# Patient Record
Sex: Male | Born: 1960
Health system: Southern US, Community
[De-identification: ages and names within clinical notes are randomized; demographics above are authoritative.]

## PROBLEM LIST (undated history)

## (undated) DIAGNOSIS — I2510 Atherosclerotic heart disease of native coronary artery without angina pectoris: Secondary | ICD-10-CM

## (undated) DIAGNOSIS — Q2543 Congenital aneurysm of aorta: Secondary | ICD-10-CM

## (undated) DIAGNOSIS — I10 Essential (primary) hypertension: Secondary | ICD-10-CM

## (undated) DIAGNOSIS — D751 Secondary polycythemia: Secondary | ICD-10-CM

## (undated) DIAGNOSIS — I444 Left anterior fascicular block: Secondary | ICD-10-CM

## (undated) DIAGNOSIS — E291 Testicular hypofunction: Secondary | ICD-10-CM

## (undated) DIAGNOSIS — Z531 Procedure and treatment not carried out because of patient's decision for reasons of belief and group pressure: Secondary | ICD-10-CM

## (undated) DIAGNOSIS — IMO0001 Reserved for inherently not codable concepts without codable children: Secondary | ICD-10-CM

## (undated) DIAGNOSIS — E785 Hyperlipidemia, unspecified: Secondary | ICD-10-CM

## (undated) DIAGNOSIS — R2231 Localized swelling, mass and lump, right upper limb: Secondary | ICD-10-CM

## (undated) DIAGNOSIS — G4733 Obstructive sleep apnea (adult) (pediatric): Secondary | ICD-10-CM

## (undated) DIAGNOSIS — N182 Chronic kidney disease, stage 2 (mild): Secondary | ICD-10-CM

## (undated) DIAGNOSIS — I4510 Unspecified right bundle-branch block: Secondary | ICD-10-CM

## (undated) DIAGNOSIS — K219 Gastro-esophageal reflux disease without esophagitis: Secondary | ICD-10-CM

## (undated) DIAGNOSIS — N529 Male erectile dysfunction, unspecified: Secondary | ICD-10-CM

## (undated) DIAGNOSIS — IMO0002 Reserved for concepts with insufficient information to code with codable children: Secondary | ICD-10-CM

## (undated) DIAGNOSIS — R011 Cardiac murmur, unspecified: Secondary | ICD-10-CM

## (undated) DIAGNOSIS — I5030 Unspecified diastolic (congestive) heart failure: Secondary | ICD-10-CM

## (undated) DIAGNOSIS — M7989 Other specified soft tissue disorders: Secondary | ICD-10-CM

## (undated) DIAGNOSIS — E1165 Type 2 diabetes mellitus with hyperglycemia: Secondary | ICD-10-CM

## (undated) HISTORY — DX: Hyperlipidemia, unspecified: E78.5

## (undated) HISTORY — PX: OTHER SURGICAL HISTORY: SHX169

## (undated) HISTORY — PX: TRANSTHORACIC ECHOCARDIOGRAM: SHX275

## (undated) HISTORY — DX: Essential (primary) hypertension: I10

## (undated) HISTORY — DX: Gastro-esophageal reflux disease without esophagitis: K21.9

## (undated) HISTORY — DX: Male erectile dysfunction, unspecified: N52.9

---

## 1998-05-07 ENCOUNTER — Emergency Department (HOSPITAL_COMMUNITY): Admission: EM | Admit: 1998-05-07 | Discharge: 1998-05-07 | Payer: Self-pay | Admitting: Emergency Medicine

## 2002-03-01 ENCOUNTER — Encounter: Admission: RE | Admit: 2002-03-01 | Discharge: 2002-03-01 | Payer: Self-pay | Admitting: Urology

## 2002-03-01 ENCOUNTER — Encounter: Payer: Self-pay | Admitting: Urology

## 2005-03-11 ENCOUNTER — Ambulatory Visit: Payer: Self-pay | Admitting: Hematology & Oncology

## 2005-04-28 ENCOUNTER — Ambulatory Visit: Payer: Self-pay | Admitting: Hematology & Oncology

## 2005-06-25 ENCOUNTER — Ambulatory Visit: Payer: Self-pay | Admitting: Hematology & Oncology

## 2005-09-17 ENCOUNTER — Ambulatory Visit: Payer: Self-pay | Admitting: Hematology & Oncology

## 2005-09-30 ENCOUNTER — Ambulatory Visit (HOSPITAL_COMMUNITY): Admission: RE | Admit: 2005-09-30 | Discharge: 2005-09-30 | Payer: Self-pay | Admitting: Hematology & Oncology

## 2005-12-10 ENCOUNTER — Ambulatory Visit: Payer: Self-pay | Admitting: Hematology & Oncology

## 2006-01-21 ENCOUNTER — Ambulatory Visit: Payer: Self-pay | Admitting: Hematology & Oncology

## 2006-01-21 LAB — CBC WITH DIFFERENTIAL/PLATELET
Basophils Absolute: 0 10*3/uL (ref 0.0–0.1)
EOS%: 4.3 % (ref 0.0–7.0)
HCT: 47.9 % (ref 38.7–49.9)
HGB: 16.3 g/dL (ref 13.0–17.1)
MCH: 31.2 pg (ref 28.0–33.4)
MCV: 91.6 fL (ref 81.6–98.0)
MONO%: 10.7 % (ref 0.0–13.0)
NEUT%: 56.6 % (ref 40.0–75.0)
RDW: 13.4 % (ref 11.2–14.6)

## 2006-03-01 ENCOUNTER — Ambulatory Visit: Payer: Self-pay | Admitting: Hematology & Oncology

## 2006-03-20 LAB — FERRITIN: Ferritin: 26 ng/mL (ref 22–322)

## 2006-03-20 LAB — CBC WITH DIFFERENTIAL/PLATELET
Eosinophils Absolute: 0.4 10*3/uL (ref 0.0–0.5)
LYMPH%: 24.5 % (ref 14.0–48.0)
MONO#: 0.9 10*3/uL (ref 0.1–0.9)
NEUT#: 5.6 10*3/uL (ref 1.5–6.5)
Platelets: 242 10*3/uL (ref 145–400)
RBC: 5.21 10*6/uL (ref 4.20–5.71)
WBC: 9.2 10*3/uL (ref 4.0–10.0)
lymph#: 2.3 10*3/uL (ref 0.9–3.3)

## 2006-04-13 ENCOUNTER — Ambulatory Visit: Payer: Self-pay | Admitting: Hematology & Oncology

## 2006-05-08 LAB — CBC WITH DIFFERENTIAL/PLATELET
Basophils Absolute: 0.1 10*3/uL (ref 0.0–0.1)
EOS%: 5.2 % (ref 0.0–7.0)
Eosinophils Absolute: 0.5 10*3/uL (ref 0.0–0.5)
HGB: 18.4 g/dL — ABNORMAL HIGH (ref 13.0–17.1)
MCH: 32.7 pg (ref 28.0–33.4)
NEUT#: 5.2 10*3/uL (ref 1.5–6.5)
RDW: 13.2 % (ref 11.2–14.6)
lymph#: 2.1 10*3/uL (ref 0.9–3.3)

## 2006-07-02 ENCOUNTER — Ambulatory Visit: Payer: Self-pay | Admitting: Hematology & Oncology

## 2006-07-15 LAB — CBC WITH DIFFERENTIAL/PLATELET
BASO%: 0.4 % (ref 0.0–2.0)
EOS%: 3 % (ref 0.0–7.0)
HCT: 46.1 % (ref 38.7–49.9)
LYMPH%: 21.6 % (ref 14.0–48.0)
MCH: 33.2 pg (ref 28.0–33.4)
MCHC: 35.6 g/dL (ref 32.0–35.9)
NEUT%: 65.9 % (ref 40.0–75.0)
Platelets: 276 10*3/uL (ref 145–400)

## 2006-07-22 LAB — CBC WITH DIFFERENTIAL/PLATELET
BASO%: 0.6 % (ref 0.0–2.0)
EOS%: 3.9 % (ref 0.0–7.0)
MCH: 33.3 pg (ref 28.0–33.4)
MCHC: 35.5 g/dL (ref 32.0–35.9)
RBC: 4.67 10*6/uL (ref 4.20–5.71)
RDW: 13.7 % (ref 11.2–14.6)
lymph#: 2 10*3/uL (ref 0.9–3.3)

## 2006-08-17 ENCOUNTER — Ambulatory Visit: Payer: Self-pay | Admitting: Hematology & Oncology

## 2006-10-07 ENCOUNTER — Ambulatory Visit: Payer: Self-pay | Admitting: Hematology & Oncology

## 2006-10-12 LAB — CBC WITH DIFFERENTIAL/PLATELET
BASO%: 0.4 % (ref 0.0–2.0)
LYMPH%: 28.4 % (ref 14.0–48.0)
MCH: 31.2 pg (ref 28.0–33.4)
MCHC: 34.8 g/dL (ref 32.0–35.9)
MCV: 89.6 fL (ref 81.6–98.0)
MONO%: 8.9 % (ref 0.0–13.0)
Platelets: 261 10*3/uL (ref 145–400)
RBC: 5.27 10*6/uL (ref 4.20–5.71)

## 2006-10-12 LAB — FERRITIN: Ferritin: 34 ng/mL (ref 22–322)

## 2006-11-25 ENCOUNTER — Ambulatory Visit: Payer: Self-pay | Admitting: Hematology & Oncology

## 2007-01-19 ENCOUNTER — Ambulatory Visit: Payer: Self-pay | Admitting: Hematology & Oncology

## 2007-01-29 LAB — CBC WITH DIFFERENTIAL/PLATELET
BASO%: 0.5 % (ref 0.0–2.0)
EOS%: 3.8 % (ref 0.0–7.0)
MCH: 32.4 pg (ref 28.0–33.4)
MCHC: 36.4 g/dL — ABNORMAL HIGH (ref 32.0–35.9)
MONO%: 10.7 % (ref 0.0–13.0)
NEUT%: 58.3 % (ref 40.0–75.0)
RDW: 14 % (ref 11.2–14.6)
lymph#: 2.1 10*3/uL (ref 0.9–3.3)

## 2007-04-27 ENCOUNTER — Ambulatory Visit: Payer: Self-pay | Admitting: Hematology & Oncology

## 2007-04-30 LAB — CBC WITH DIFFERENTIAL/PLATELET
Basophils Absolute: 0 10*3/uL (ref 0.0–0.1)
Eosinophils Absolute: 0.3 10*3/uL (ref 0.0–0.5)
HCT: 45.7 % (ref 38.7–49.9)
HGB: 16.6 g/dL (ref 13.0–17.1)
LYMPH%: 25.9 % (ref 14.0–48.0)
MONO#: 0.7 10*3/uL (ref 0.1–0.9)
NEUT#: 5.4 10*3/uL (ref 1.5–6.5)
Platelets: 254 10*3/uL (ref 145–400)
RBC: 4.95 10*6/uL (ref 4.20–5.71)
WBC: 8.8 10*3/uL (ref 4.0–10.0)

## 2007-04-30 LAB — FERRITIN: Ferritin: 119 ng/mL (ref 22–322)

## 2007-05-13 LAB — CBC WITH DIFFERENTIAL/PLATELET
Basophils Absolute: 0 10*3/uL (ref 0.0–0.1)
HCT: 44.6 % (ref 38.7–49.9)
HGB: 16.2 g/dL (ref 13.0–17.1)
MCH: 32.8 pg (ref 28.0–33.4)
MONO#: 0.6 10*3/uL (ref 0.1–0.9)
NEUT%: 64.9 % (ref 40.0–75.0)
WBC: 8.9 10*3/uL (ref 4.0–10.0)
lymph#: 2.1 10*3/uL (ref 0.9–3.3)

## 2007-05-20 LAB — CBC WITH DIFFERENTIAL/PLATELET
Basophils Absolute: 0.1 10*3/uL (ref 0.0–0.1)
EOS%: 3.2 % (ref 0.0–7.0)
HCT: UNDETERMINED % (ref 38.7–49.9)
HGB: 15.7 g/dL (ref 13.0–17.1)
MCH: -1000 pg — ABNORMAL LOW (ref 28.0–33.4)
MCV: UNDETERMINED fL (ref 81.6–98.0)
MONO%: 7 % (ref 0.0–13.0)
NEUT%: 63.9 % (ref 40.0–75.0)

## 2007-08-04 ENCOUNTER — Ambulatory Visit: Payer: Self-pay | Admitting: Hematology & Oncology

## 2007-08-06 LAB — CBC WITH DIFFERENTIAL/PLATELET
Basophils Absolute: 0.1 10*3/uL (ref 0.0–0.1)
EOS%: 2.7 % (ref 0.0–7.0)
Eosinophils Absolute: 0.2 10*3/uL (ref 0.0–0.5)
LYMPH%: 24.2 % (ref 14.0–48.0)
MCH: 32.4 pg (ref 28.0–33.4)
MCV: 89.8 fL (ref 81.6–98.0)
MONO%: 8.4 % (ref 0.0–13.0)
NEUT#: 5.2 10*3/uL (ref 1.5–6.5)
Platelets: 260 10*3/uL (ref 145–400)
RBC: 5.14 10*6/uL (ref 4.20–5.71)

## 2007-08-06 LAB — HEPATIC FUNCTION PANEL
ALT: 50 U/L (ref 0–53)
AST: 28 U/L (ref 0–37)
Albumin: 4.5 g/dL (ref 3.5–5.2)
Total Bilirubin: 0.6 mg/dL (ref 0.3–1.2)

## 2007-08-06 LAB — FERRITIN: Ferritin: 44 ng/mL (ref 22–322)

## 2008-01-26 ENCOUNTER — Ambulatory Visit: Payer: Self-pay | Admitting: Hematology & Oncology

## 2008-01-28 LAB — CBC WITH DIFFERENTIAL/PLATELET
Basophils Absolute: 0.1 10*3/uL (ref 0.0–0.1)
Eosinophils Absolute: 0.2 10*3/uL (ref 0.0–0.5)
HGB: 16.5 g/dL (ref 13.0–17.1)
MONO#: 0.6 10*3/uL (ref 0.1–0.9)
NEUT#: 5.1 10*3/uL (ref 1.5–6.5)
RBC: 5.03 10*6/uL (ref 4.20–5.71)
RDW: 13 % (ref 11.2–14.6)
WBC: 7.8 10*3/uL (ref 4.0–10.0)

## 2008-01-28 LAB — HEPATIC FUNCTION PANEL
ALT: 44 U/L (ref 0–53)
AST: 20 U/L (ref 0–37)
Alkaline Phosphatase: 58 U/L (ref 39–117)
Bilirubin, Direct: 0.1 mg/dL (ref 0.0–0.3)
Indirect Bilirubin: 0.5 mg/dL (ref 0.0–0.9)
Total Bilirubin: 0.6 mg/dL (ref 0.3–1.2)

## 2008-02-10 LAB — CBC WITH DIFFERENTIAL/PLATELET
EOS%: 3.3 % (ref 0.0–7.0)
Eosinophils Absolute: 0.3 10*3/uL (ref 0.0–0.5)
HGB: 15.4 g/dL (ref 13.0–17.1)
MCV: 91.5 fL (ref 81.6–98.0)
MONO%: 7.7 % (ref 0.0–13.0)
NEUT#: 6.4 10*3/uL (ref 1.5–6.5)
RBC: 4.75 10*6/uL (ref 4.20–5.71)
RDW: 12.7 % (ref 11.2–14.6)
lymph#: 2.2 10*3/uL (ref 0.9–3.3)

## 2008-03-14 ENCOUNTER — Ambulatory Visit: Payer: Self-pay | Admitting: Hematology & Oncology

## 2008-05-17 ENCOUNTER — Ambulatory Visit: Payer: Self-pay | Admitting: Hematology & Oncology

## 2008-05-19 LAB — CBC WITH DIFFERENTIAL (CANCER CENTER ONLY)
BASO%: 3.4 % — ABNORMAL HIGH (ref 0.0–2.0)
EOS%: 4.8 % (ref 0.0–7.0)
HCT: 52.9 % — ABNORMAL HIGH (ref 38.7–49.9)
LYMPH%: 24.3 % (ref 14.0–48.0)
MCHC: 34.1 g/dL (ref 32.0–35.9)
MCV: 93 fL (ref 82–98)
NEUT%: 60.2 % (ref 40.0–80.0)
RDW: 11.2 % (ref 10.5–14.6)

## 2008-05-19 LAB — COMPREHENSIVE METABOLIC PANEL
ALT: 63 U/L — ABNORMAL HIGH (ref 0–53)
AST: 33 U/L (ref 0–37)
Albumin: 4.4 g/dL (ref 3.5–5.2)
CO2: 23 mEq/L (ref 19–32)
Calcium: 9.4 mg/dL (ref 8.4–10.5)
Chloride: 103 mEq/L (ref 96–112)
Potassium: 4.3 mEq/L (ref 3.5–5.3)

## 2008-05-19 LAB — FERRITIN: Ferritin: 79 ng/mL (ref 22–322)

## 2008-07-05 ENCOUNTER — Ambulatory Visit: Payer: Self-pay | Admitting: Hematology & Oncology

## 2008-07-06 LAB — CBC WITH DIFFERENTIAL (CANCER CENTER ONLY)
BASO%: 1.9 % (ref 0.0–2.0)
Eosinophils Absolute: 0.3 10*3/uL (ref 0.0–0.5)
HCT: 45.4 % (ref 38.7–49.9)
LYMPH%: 26.7 % (ref 14.0–48.0)
MCH: 31.4 pg (ref 28.0–33.4)
MCV: 91 fL (ref 82–98)
MONO#: 0.6 10*3/uL (ref 0.1–0.9)
NEUT%: 60.4 % (ref 40.0–80.0)
RDW: 13.4 % (ref 10.5–14.6)
WBC: 8.1 10*3/uL (ref 4.0–10.0)

## 2008-07-06 LAB — COMPREHENSIVE METABOLIC PANEL
ALT: 41 U/L (ref 0–53)
AST: 22 U/L (ref 0–37)
BUN: 13 mg/dL (ref 6–23)
CO2: 24 mEq/L (ref 19–32)
Calcium: 9.5 mg/dL (ref 8.4–10.5)
Chloride: 102 mEq/L (ref 96–112)
Creatinine, Ser: 0.97 mg/dL (ref 0.40–1.50)
Total Bilirubin: 0.8 mg/dL (ref 0.3–1.2)

## 2008-07-06 LAB — FERRITIN: Ferritin: 108 ng/mL (ref 22–322)

## 2008-09-01 ENCOUNTER — Ambulatory Visit: Payer: Self-pay | Admitting: Hematology & Oncology

## 2008-10-04 LAB — COMPREHENSIVE METABOLIC PANEL WITH GFR
ALT: 51 U/L (ref 0–53)
AST: 25 U/L (ref 0–37)
Albumin: 4.5 g/dL (ref 3.5–5.2)
Alkaline Phosphatase: 47 U/L (ref 39–117)
BUN: 16 mg/dL (ref 6–23)
CO2: 22 meq/L (ref 19–32)
Calcium: 9.9 mg/dL (ref 8.4–10.5)
Chloride: 102 meq/L (ref 96–112)
Creatinine, Ser: 1.13 mg/dL (ref 0.40–1.50)
Glucose, Bld: 126 mg/dL — ABNORMAL HIGH (ref 70–99)
Potassium: 4.2 meq/L (ref 3.5–5.3)
Sodium: 136 meq/L (ref 135–145)
Total Bilirubin: 0.6 mg/dL (ref 0.3–1.2)
Total Protein: 7.4 g/dL (ref 6.0–8.3)

## 2008-10-04 LAB — TESTOSTERONE: Testosterone: 342.08 ng/dL — ABNORMAL LOW (ref 350–890)

## 2008-10-04 LAB — CBC WITH DIFFERENTIAL (CANCER CENTER ONLY)
BASO%: 2.3 % — ABNORMAL HIGH (ref 0.0–2.0)
Eosinophils Absolute: 0.3 10*3/uL (ref 0.0–0.5)
HCT: 53.1 % — ABNORMAL HIGH (ref 38.7–49.9)
LYMPH%: 24.4 % (ref 14.0–48.0)
MCH: 32.3 pg (ref 28.0–33.4)
MCV: 95 fL (ref 82–98)
MONO#: 0.5 10*3/uL (ref 0.1–0.9)
MONO%: 5.9 % (ref 0.0–13.0)
NEUT%: 64.5 % (ref 40.0–80.0)
RDW: 10.4 % — ABNORMAL LOW (ref 10.5–14.6)
WBC: 9.1 10*3/uL (ref 4.0–10.0)

## 2008-10-04 LAB — PSA: PSA: 0.4 ng/mL (ref 0.10–4.00)

## 2008-10-04 LAB — FERRITIN: Ferritin: 22 ng/mL (ref 22–322)

## 2008-10-11 LAB — CBC WITH DIFFERENTIAL (CANCER CENTER ONLY)
BASO#: 0.1 10*3/uL (ref 0.0–0.2)
Eosinophils Absolute: 0.3 10*3/uL (ref 0.0–0.5)
HGB: 17 g/dL (ref 13.0–17.1)
LYMPH#: 2 10*3/uL (ref 0.9–3.3)
MCH: 32.1 pg (ref 28.0–33.4)
MONO#: 0.7 10*3/uL (ref 0.1–0.9)
MONO%: 8 % (ref 0.0–13.0)
NEUT#: 5.4 10*3/uL (ref 1.5–6.5)
Platelets: 273 10*3/uL (ref 145–400)
RBC: 5.31 10*6/uL (ref 4.20–5.70)
WBC: 8.5 10*3/uL (ref 4.0–10.0)

## 2008-10-17 ENCOUNTER — Ambulatory Visit: Payer: Self-pay | Admitting: Hematology & Oncology

## 2008-10-18 LAB — CBC WITH DIFFERENTIAL (CANCER CENTER ONLY)
BASO#: 0.1 10*3/uL (ref 0.0–0.2)
Eosinophils Absolute: 0.3 10*3/uL (ref 0.0–0.5)
HCT: 45.8 % (ref 38.7–49.9)
HGB: 15.9 g/dL (ref 13.0–17.1)
LYMPH%: 28.9 % (ref 14.0–48.0)
MCH: 32.3 pg (ref 28.0–33.4)
MCV: 93 fL (ref 82–98)
MONO#: 0.7 10*3/uL (ref 0.1–0.9)
MONO%: 8.8 % (ref 0.0–13.0)
Platelets: 268 10*3/uL (ref 145–400)
RBC: 4.91 10*6/uL (ref 4.20–5.70)
WBC: 7.4 10*3/uL (ref 4.0–10.0)

## 2008-11-23 LAB — CBC WITH DIFFERENTIAL (CANCER CENTER ONLY)
BASO#: 0.1 10*3/uL (ref 0.0–0.2)
BASO%: 0.8 % (ref 0.0–2.0)
EOS%: 4.7 % (ref 0.0–7.0)
HGB: 15.1 g/dL (ref 13.0–17.1)
LYMPH#: 1.9 10*3/uL (ref 0.9–3.3)
MCH: 29.7 pg (ref 28.0–33.4)
MCHC: 32.6 g/dL (ref 32.0–35.9)
MONO%: 6.2 % (ref 0.0–13.0)
NEUT#: 3.8 10*3/uL (ref 1.5–6.5)
Platelets: 259 10*3/uL (ref 145–400)

## 2008-11-24 LAB — COMPREHENSIVE METABOLIC PANEL
ALT: 40 U/L (ref 0–53)
AST: 22 U/L (ref 0–37)
CO2: 25 mEq/L (ref 19–32)
Calcium: 9.5 mg/dL (ref 8.4–10.5)
Chloride: 105 mEq/L (ref 96–112)
Sodium: 141 mEq/L (ref 135–145)
Total Bilirubin: 0.4 mg/dL (ref 0.3–1.2)
Total Protein: 7.1 g/dL (ref 6.0–8.3)

## 2008-11-24 LAB — RETICULOCYTES (CHCC)
ABS Retic: 65.5 10*3/uL (ref 19.0–186.0)
RBC.: 5.04 MIL/uL (ref 4.22–5.81)

## 2008-12-20 ENCOUNTER — Ambulatory Visit: Payer: Self-pay | Admitting: Hematology & Oncology

## 2008-12-21 LAB — CBC WITH DIFFERENTIAL (CANCER CENTER ONLY)
BASO#: 0.1 10*3/uL (ref 0.0–0.2)
BASO%: 1.2 % (ref 0.0–2.0)
EOS%: 4.1 % (ref 0.0–7.0)
LYMPH#: 2.3 10*3/uL (ref 0.9–3.3)
MCH: 29.7 pg (ref 28.0–33.4)
MCHC: 32.6 g/dL (ref 32.0–35.9)
MONO%: 8.2 % (ref 0.0–13.0)
NEUT#: 5.4 10*3/uL (ref 1.5–6.5)
NEUT%: 60.8 % (ref 40.0–80.0)
RDW: 12.6 % (ref 10.5–14.6)

## 2009-01-18 LAB — CBC WITH DIFFERENTIAL (CANCER CENTER ONLY)
BASO#: 0.1 10*3/uL (ref 0.0–0.2)
Eosinophils Absolute: 0.3 10*3/uL (ref 0.0–0.5)
HCT: 43.7 % (ref 38.7–49.9)
HGB: 14.6 g/dL (ref 13.0–17.1)
LYMPH%: 27.9 % (ref 14.0–48.0)
MCH: 29.1 pg (ref 28.0–33.4)
MCV: 87 fL (ref 82–98)
MONO#: 0.5 10*3/uL (ref 0.1–0.9)
MONO%: 6.5 % (ref 0.0–13.0)
NEUT%: 60.5 % (ref 40.0–80.0)
Platelets: 253 10*3/uL (ref 145–400)
RBC: 5.01 10*6/uL (ref 4.20–5.70)
WBC: 7.4 10*3/uL (ref 4.0–10.0)

## 2009-02-14 ENCOUNTER — Ambulatory Visit: Payer: Self-pay | Admitting: Hematology & Oncology

## 2009-02-16 LAB — CBC WITH DIFFERENTIAL (CANCER CENTER ONLY)
BASO#: 0.1 10*3/uL (ref 0.0–0.2)
Eosinophils Absolute: 0.4 10*3/uL (ref 0.0–0.5)
HGB: 14.9 g/dL (ref 13.0–17.1)
MCH: 28.3 pg (ref 28.0–33.4)
MONO#: 0.6 10*3/uL (ref 0.1–0.9)
MONO%: 7.2 % (ref 0.0–13.0)
NEUT#: 4.9 10*3/uL (ref 1.5–6.5)
Platelets: 233 10*3/uL (ref 145–400)
RBC: 5.26 10*6/uL (ref 4.20–5.70)
WBC: 8.6 10*3/uL (ref 4.0–10.0)

## 2009-04-11 ENCOUNTER — Ambulatory Visit: Payer: Self-pay | Admitting: Hematology & Oncology

## 2009-04-12 LAB — CBC WITH DIFFERENTIAL (CANCER CENTER ONLY)
BASO#: 0.1 10*3/uL (ref 0.0–0.2)
EOS%: 5.2 % (ref 0.0–7.0)
Eosinophils Absolute: 0.4 10*3/uL (ref 0.0–0.5)
HGB: 15.5 g/dL (ref 13.0–17.1)
LYMPH#: 2.1 10*3/uL (ref 0.9–3.3)
MCHC: 33.2 g/dL (ref 32.0–35.9)
NEUT#: 5 10*3/uL (ref 1.5–6.5)
Platelets: 266 10*3/uL (ref 145–400)
RBC: 5.42 10*6/uL (ref 4.20–5.70)

## 2009-05-10 LAB — CBC WITH DIFFERENTIAL (CANCER CENTER ONLY)
BASO%: 1.1 % (ref 0.0–2.0)
EOS%: 3.6 % (ref 0.0–7.0)
LYMPH#: 2.3 10*3/uL (ref 0.9–3.3)
MCHC: 33.6 g/dL (ref 32.0–35.9)
MONO%: 7.9 % (ref 0.0–13.0)
NEUT#: 4.3 10*3/uL (ref 1.5–6.5)
Platelets: 272 10*3/uL (ref 145–400)
RDW: 15.7 % — ABNORMAL HIGH (ref 10.5–14.6)

## 2009-06-06 ENCOUNTER — Ambulatory Visit: Payer: Self-pay | Admitting: Hematology & Oncology

## 2009-06-07 LAB — COMPREHENSIVE METABOLIC PANEL
ALT: 38 U/L (ref 0–53)
BUN: 12 mg/dL (ref 6–23)
CO2: 22 mEq/L (ref 19–32)
Calcium: 9.4 mg/dL (ref 8.4–10.5)
Chloride: 105 mEq/L (ref 96–112)
Creatinine, Ser: 1.13 mg/dL (ref 0.40–1.50)

## 2009-06-07 LAB — CBC WITH DIFFERENTIAL (CANCER CENTER ONLY)
BASO%: 1 % (ref 0.0–2.0)
HCT: 46.5 % (ref 38.7–49.9)
LYMPH%: 27.6 % (ref 14.0–48.0)
MCV: 85 fL (ref 82–98)
MONO#: 0.6 10*3/uL (ref 0.1–0.9)
NEUT%: 60 % (ref 40.0–80.0)
RDW: 15.2 % — ABNORMAL HIGH (ref 10.5–14.6)
WBC: 8 10*3/uL (ref 4.0–10.0)

## 2009-06-07 LAB — FERRITIN: Ferritin: 13 ng/mL — ABNORMAL LOW (ref 22–322)

## 2009-07-05 LAB — CBC WITH DIFFERENTIAL (CANCER CENTER ONLY)
BASO%: 1 % (ref 0.0–2.0)
EOS%: 4.4 % (ref 0.0–7.0)
HCT: 43.9 % (ref 38.7–49.9)
LYMPH#: 2.2 10*3/uL (ref 0.9–3.3)
LYMPH%: 28 % (ref 14.0–48.0)
MCH: 28.8 pg (ref 28.0–33.4)
MCHC: 34.1 g/dL (ref 32.0–35.9)
MCV: 85 fL (ref 82–98)
MONO%: 8.1 % (ref 0.0–13.0)
NEUT%: 58.5 % (ref 40.0–80.0)
RDW: 13.3 % (ref 10.5–14.6)

## 2009-07-24 ENCOUNTER — Ambulatory Visit: Payer: Self-pay | Admitting: Hematology & Oncology

## 2009-07-25 LAB — COMPREHENSIVE METABOLIC PANEL
Alkaline Phosphatase: 56 U/L (ref 39–117)
CO2: 24 mEq/L (ref 19–32)
Creatinine, Ser: 1.13 mg/dL (ref 0.40–1.50)
Glucose, Bld: 105 mg/dL — ABNORMAL HIGH (ref 70–99)
Sodium: 139 mEq/L (ref 135–145)
Total Bilirubin: 0.5 mg/dL (ref 0.3–1.2)
Total Protein: 7.2 g/dL (ref 6.0–8.3)

## 2009-07-25 LAB — CBC WITH DIFFERENTIAL (CANCER CENTER ONLY)
BASO#: 0.1 10*3/uL (ref 0.0–0.2)
EOS%: 3.7 % (ref 0.0–7.0)
Eosinophils Absolute: 0.3 10*3/uL (ref 0.0–0.5)
HGB: 15.3 g/dL (ref 13.0–17.1)
LYMPH#: 2.3 10*3/uL (ref 0.9–3.3)
MONO#: 0.8 10*3/uL (ref 0.1–0.9)
NEUT#: 5.6 10*3/uL (ref 1.5–6.5)
RBC: 5.37 10*6/uL (ref 4.20–5.70)
WBC: 9.2 10*3/uL (ref 4.0–10.0)

## 2009-07-25 LAB — TESTOSTERONE: Testosterone: 180.85 ng/dL — ABNORMAL LOW (ref 350–890)

## 2009-08-27 ENCOUNTER — Ambulatory Visit: Payer: Self-pay | Admitting: Hematology & Oncology

## 2009-08-28 LAB — CBC WITH DIFFERENTIAL (CANCER CENTER ONLY)
BASO%: 0.9 % (ref 0.0–2.0)
EOS%: 5.3 % (ref 0.0–7.0)
LYMPH#: 1.8 10*3/uL (ref 0.9–3.3)
MCHC: 34.5 g/dL (ref 32.0–35.9)
NEUT#: 3.9 10*3/uL (ref 1.5–6.5)
RDW: 14.8 % — ABNORMAL HIGH (ref 10.5–14.6)
WBC: 6.5 10*3/uL (ref 4.0–10.0)

## 2009-10-04 ENCOUNTER — Ambulatory Visit: Payer: Self-pay | Admitting: Hematology & Oncology

## 2009-10-18 LAB — CBC WITH DIFFERENTIAL (CANCER CENTER ONLY)
BASO#: 0.1 10*3/uL (ref 0.0–0.2)
BASO%: 1.2 % (ref 0.0–2.0)
EOS%: 3.6 % (ref 0.0–7.0)
Eosinophils Absolute: 0.3 10*3/uL (ref 0.0–0.5)
HCT: 51.6 % — ABNORMAL HIGH (ref 38.7–49.9)
HGB: 17.2 g/dL — ABNORMAL HIGH (ref 13.0–17.1)
LYMPH#: 2 10*3/uL (ref 0.9–3.3)
LYMPH%: 24.8 % (ref 14.0–48.0)
MCH: 29.2 pg (ref 28.0–33.4)
MCHC: 33.4 g/dL (ref 32.0–35.9)
MCV: 87 fL (ref 82–98)
MONO#: 0.6 10*3/uL (ref 0.1–0.9)
MONO%: 7 % (ref 0.0–13.0)
NEUT#: 5.1 10*3/uL (ref 1.5–6.5)
NEUT%: 63.4 % (ref 40.0–80.0)
Platelets: 281 10*3/uL (ref 145–400)
RBC: 5.91 10*6/uL — ABNORMAL HIGH (ref 4.20–5.70)
RDW: 17.3 % — ABNORMAL HIGH (ref 10.5–14.6)
WBC: 8 10*3/uL (ref 4.0–10.0)

## 2009-10-18 LAB — COMPREHENSIVE METABOLIC PANEL
ALT: 49 U/L (ref 0–53)
AST: 23 U/L (ref 0–37)
Albumin: 4.4 g/dL (ref 3.5–5.2)
Alkaline Phosphatase: 57 U/L (ref 39–117)
BUN: 17 mg/dL (ref 6–23)
CO2: 23 mEq/L (ref 19–32)
Calcium: 9.5 mg/dL (ref 8.4–10.5)
Chloride: 104 mEq/L (ref 96–112)
Creatinine, Ser: 1.06 mg/dL (ref 0.40–1.50)
Glucose, Bld: 97 mg/dL (ref 70–99)
Potassium: 4.1 mEq/L (ref 3.5–5.3)
Sodium: 141 mEq/L (ref 135–145)
Total Bilirubin: 0.5 mg/dL (ref 0.3–1.2)
Total Protein: 7 g/dL (ref 6.0–8.3)

## 2009-10-18 LAB — TESTOSTERONE: Testosterone: 305.89 ng/dL — ABNORMAL LOW (ref 350–890)

## 2009-10-18 LAB — FERRITIN: Ferritin: 34 ng/mL (ref 22–322)

## 2009-11-14 ENCOUNTER — Ambulatory Visit: Payer: Self-pay | Admitting: Hematology & Oncology

## 2009-11-15 LAB — CBC WITH DIFFERENTIAL (CANCER CENTER ONLY)
BASO#: 0.1 10e3/uL (ref 0.0–0.2)
BASO%: 1.3 % (ref 0.0–2.0)
EOS%: 3.7 % (ref 0.0–7.0)
Eosinophils Absolute: 0.4 10e3/uL (ref 0.0–0.5)
HCT: 49.6 % (ref 38.7–49.9)
HGB: 16.6 g/dL (ref 13.0–17.1)
LYMPH#: 2.3 10e3/uL (ref 0.9–3.3)
LYMPH%: 24.4 % (ref 14.0–48.0)
MCH: 29.4 pg (ref 28.0–33.4)
MCHC: 33.5 g/dL (ref 32.0–35.9)
MCV: 88 fL (ref 82–98)
MONO#: 0.7 10e3/uL (ref 0.1–0.9)
MONO%: 7 % (ref 0.0–13.0)
NEUT#: 6 10e3/uL (ref 1.5–6.5)
NEUT%: 63.6 % (ref 40.0–80.0)
Platelets: 262 10e3/uL (ref 145–400)
RBC: 5.64 10e6/uL (ref 4.20–5.70)
RDW: 15.3 % — ABNORMAL HIGH (ref 10.5–14.6)
WBC: 9.4 10e3/uL (ref 4.0–10.0)

## 2009-12-13 LAB — CBC WITH DIFFERENTIAL (CANCER CENTER ONLY)
BASO#: 0.1 10*3/uL (ref 0.0–0.2)
BASO%: 0.7 % (ref 0.0–2.0)
EOS%: 3.9 % (ref 0.0–7.0)
HGB: 15.5 g/dL (ref 13.0–17.1)
LYMPH#: 2 10*3/uL (ref 0.9–3.3)
MCH: 29.7 pg (ref 28.0–33.4)
MCHC: 33.2 g/dL (ref 32.0–35.9)
MONO%: 5.2 % (ref 0.0–13.0)
NEUT#: 5.5 10*3/uL (ref 1.5–6.5)
Platelets: 258 10*3/uL (ref 145–400)

## 2009-12-13 LAB — COMPREHENSIVE METABOLIC PANEL
ALT: 33 U/L (ref 0–53)
AST: 19 U/L (ref 0–37)
Albumin: 4.5 g/dL (ref 3.5–5.2)
Alkaline Phosphatase: 49 U/L (ref 39–117)
BUN: 13 mg/dL (ref 6–23)
Calcium: 9.2 mg/dL (ref 8.4–10.5)
Chloride: 104 mEq/L (ref 96–112)
Potassium: 4.1 mEq/L (ref 3.5–5.3)
Sodium: 138 mEq/L (ref 135–145)
Total Protein: 7 g/dL (ref 6.0–8.3)

## 2010-01-08 ENCOUNTER — Ambulatory Visit: Payer: Self-pay | Admitting: Hematology & Oncology

## 2010-02-06 LAB — COMPREHENSIVE METABOLIC PANEL
CO2: 24 mEq/L (ref 19–32)
Calcium: 9.6 mg/dL (ref 8.4–10.5)
Chloride: 103 mEq/L (ref 96–112)
Creatinine, Ser: 1.21 mg/dL (ref 0.40–1.50)
Glucose, Bld: 110 mg/dL — ABNORMAL HIGH (ref 70–99)
Sodium: 140 mEq/L (ref 135–145)
Total Bilirubin: 0.6 mg/dL (ref 0.3–1.2)
Total Protein: 6.8 g/dL (ref 6.0–8.3)

## 2010-02-06 LAB — CBC WITH DIFFERENTIAL (CANCER CENTER ONLY)
BASO%: 0.9 % (ref 0.0–2.0)
EOS%: 4.9 % (ref 0.0–7.0)
Eosinophils Absolute: 0.4 10*3/uL (ref 0.0–0.5)
MCH: 29.9 pg (ref 28.0–33.4)
MCHC: 33.5 g/dL (ref 32.0–35.9)
MONO%: 5.9 % (ref 0.0–13.0)
NEUT#: 5.5 10*3/uL (ref 1.5–6.5)
Platelets: 224 10*3/uL (ref 145–400)
RBC: 5.23 10*6/uL (ref 4.20–5.70)
RDW: 13.6 % (ref 10.5–14.6)

## 2010-02-06 LAB — TESTOSTERONE: Testosterone: 271.27 ng/dL — ABNORMAL LOW (ref 350–890)

## 2010-02-06 LAB — FERRITIN: Ferritin: 16 ng/mL — ABNORMAL LOW (ref 22–322)

## 2010-03-07 ENCOUNTER — Ambulatory Visit: Payer: Self-pay | Admitting: Hematology & Oncology

## 2010-04-04 LAB — CBC WITH DIFFERENTIAL (CANCER CENTER ONLY)
Eosinophils Absolute: 0.4 10*3/uL (ref 0.0–0.5)
HCT: 51.3 % — ABNORMAL HIGH (ref 38.7–49.9)
LYMPH%: 22.8 % (ref 14.0–48.0)
MCV: 89 fL (ref 82–98)
MONO#: 0.6 10*3/uL (ref 0.1–0.9)
NEUT%: 61.8 % (ref 40.0–80.0)
Platelets: 241 10*3/uL (ref 145–400)
RBC: 5.75 10*6/uL — ABNORMAL HIGH (ref 4.20–5.70)
WBC: 8.1 10*3/uL (ref 4.0–10.0)

## 2010-04-04 LAB — COMPREHENSIVE METABOLIC PANEL
CO2: 22 mEq/L (ref 19–32)
Calcium: 10 mg/dL (ref 8.4–10.5)
Glucose, Bld: 113 mg/dL — ABNORMAL HIGH (ref 70–99)
Sodium: 138 mEq/L (ref 135–145)
Total Bilirubin: 0.7 mg/dL (ref 0.3–1.2)
Total Protein: 7.4 g/dL (ref 6.0–8.3)

## 2010-04-04 LAB — TESTOSTERONE: Testosterone: 335.95 ng/dL — ABNORMAL LOW (ref 350–890)

## 2010-04-04 LAB — FERRITIN: Ferritin: 37 ng/mL (ref 22–322)

## 2010-04-25 ENCOUNTER — Ambulatory Visit: Payer: Self-pay | Admitting: Hematology & Oncology

## 2010-05-06 LAB — CBC WITH DIFFERENTIAL (CANCER CENTER ONLY)
BASO#: 0.1 10*3/uL (ref 0.0–0.2)
BASO%: 1.3 % (ref 0.0–2.0)
EOS%: 4.6 % (ref 0.0–7.0)
Eosinophils Absolute: 0.4 10*3/uL (ref 0.0–0.5)
HCT: 48.9 % (ref 38.7–49.9)
HGB: 16.5 g/dL (ref 13.0–17.1)
LYMPH#: 2.2 10*3/uL (ref 0.9–3.3)
LYMPH%: 28 % (ref 14.0–48.0)
MCH: 30.5 pg (ref 28.0–33.4)
MCHC: 33.8 g/dL (ref 32.0–35.9)
MCV: 90 fL (ref 82–98)
MONO#: 0.6 10*3/uL (ref 0.1–0.9)
MONO%: 7.8 % (ref 0.0–13.0)
NEUT#: 4.6 10*3/uL (ref 1.5–6.5)
NEUT%: 58.3 % (ref 40.0–80.0)
Platelets: 279 10*3/uL (ref 145–400)
RBC: 5.42 10*6/uL (ref 4.20–5.70)
RDW: 13.7 % (ref 10.5–14.6)
WBC: 7.9 10*3/uL (ref 4.0–10.0)

## 2010-05-29 ENCOUNTER — Ambulatory Visit: Payer: Self-pay | Admitting: Hematology & Oncology

## 2010-05-30 LAB — CBC WITH DIFFERENTIAL (CANCER CENTER ONLY)
BASO#: 0.1 10*3/uL (ref 0.0–0.2)
Eosinophils Absolute: 0.4 10*3/uL (ref 0.0–0.5)
HCT: 46.3 % (ref 38.7–49.9)
HGB: 15.8 g/dL (ref 13.0–17.1)
LYMPH#: 2 10*3/uL (ref 0.9–3.3)
MONO#: 0.5 10*3/uL (ref 0.1–0.9)
NEUT%: 63.8 % (ref 40.0–80.0)
WBC: 8 10*3/uL (ref 4.0–10.0)

## 2010-07-02 ENCOUNTER — Ambulatory Visit: Payer: Self-pay | Admitting: Hematology & Oncology

## 2010-08-27 ENCOUNTER — Ambulatory Visit: Payer: Self-pay | Admitting: Hematology & Oncology

## 2010-08-28 LAB — CBC WITH DIFFERENTIAL (CANCER CENTER ONLY)
BASO%: 2.5 % — ABNORMAL HIGH (ref 0.0–2.0)
LYMPH%: 24.9 % (ref 14.0–48.0)
MCV: 91 fL (ref 82–98)
MONO#: 0.6 10*3/uL (ref 0.1–0.9)
MONO%: 7.6 % (ref 0.0–13.0)
NEUT#: 4.8 10*3/uL (ref 1.5–6.5)
Platelets: 242 10*3/uL (ref 145–400)
RBC: 5.34 10*6/uL (ref 4.20–5.70)
RDW: 14.4 % (ref 10.5–14.6)
WBC: 7.9 10*3/uL (ref 4.0–10.0)

## 2010-08-29 LAB — COMPREHENSIVE METABOLIC PANEL
ALT: 50 U/L (ref 0–53)
Albumin: 4.5 g/dL (ref 3.5–5.2)
Alkaline Phosphatase: 56 U/L (ref 39–117)
Potassium: 4 mEq/L (ref 3.5–5.3)
Sodium: 138 mEq/L (ref 135–145)
Total Bilirubin: 0.7 mg/dL (ref 0.3–1.2)
Total Protein: 7.2 g/dL (ref 6.0–8.3)

## 2010-08-29 LAB — TSH: TSH: 0.712 u[IU]/mL (ref 0.350–4.500)

## 2010-10-08 ENCOUNTER — Ambulatory Visit: Payer: Self-pay | Admitting: Hematology & Oncology

## 2010-10-09 LAB — CBC WITH DIFFERENTIAL (CANCER CENTER ONLY)
BASO#: 0.2 10*3/uL (ref 0.0–0.2)
BASO%: 2 % (ref 0.0–2.0)
EOS%: 3.7 % (ref 0.0–7.0)
Eosinophils Absolute: 0.3 10*3/uL (ref 0.0–0.5)
HCT: 48.7 % (ref 38.7–49.9)
HGB: 16.4 g/dL (ref 13.0–17.1)
LYMPH#: 1.9 10*3/uL (ref 0.9–3.3)
LYMPH%: 21 % (ref 14.0–48.0)
MCH: 31.1 pg (ref 28.0–33.4)
MCHC: 33.7 g/dL (ref 32.0–35.9)
MCV: 92 fL (ref 82–98)
MONO#: 0.5 10*3/uL (ref 0.1–0.9)
MONO%: 6.1 % (ref 0.0–13.0)
NEUT#: 6 10*3/uL (ref 1.5–6.5)
NEUT%: 67.2 % (ref 40.0–80.0)
Platelets: 279 10*3/uL (ref 145–400)
RBC: 5.27 10*6/uL (ref 4.20–5.70)
RDW: 13 % (ref 10.5–14.6)
WBC: 8.9 10*3/uL (ref 4.0–10.0)

## 2010-10-30 LAB — CBC WITH DIFFERENTIAL (CANCER CENTER ONLY)
BASO#: 0.1 10*3/uL (ref 0.0–0.2)
BASO%: 1.4 % (ref 0.0–2.0)
EOS%: 4.5 % (ref 0.0–7.0)
Eosinophils Absolute: 0.4 10*3/uL (ref 0.0–0.5)
HCT: 48.2 % (ref 38.7–49.9)
HGB: 16.6 g/dL (ref 13.0–17.1)
LYMPH#: 1.9 10*3/uL (ref 0.9–3.3)
LYMPH%: 24.5 % (ref 14.0–48.0)
MCH: 31.7 pg (ref 28.0–33.4)
MCHC: 34.4 g/dL (ref 32.0–35.9)
MCV: 92 fL (ref 82–98)
MONO#: 0.6 10*3/uL (ref 0.1–0.9)
MONO%: 7.4 % (ref 0.0–13.0)
NEUT#: 4.9 10*3/uL (ref 1.5–6.5)
NEUT%: 62.2 % (ref 40.0–80.0)
Platelets: 237 10*3/uL (ref 145–400)
RBC: 5.22 10*6/uL (ref 4.20–5.70)
RDW: 12.4 % (ref 10.5–14.6)
WBC: 7.9 10*3/uL (ref 4.0–10.0)

## 2010-10-30 LAB — COMPREHENSIVE METABOLIC PANEL
ALT: 36 U/L (ref 0–53)
AST: 20 U/L (ref 0–37)
Albumin: 4.3 g/dL (ref 3.5–5.2)
Alkaline Phosphatase: 56 U/L (ref 39–117)
BUN: 15 mg/dL (ref 6–23)
CO2: 23 mEq/L (ref 19–32)
Calcium: 9.3 mg/dL (ref 8.4–10.5)
Chloride: 100 mEq/L (ref 96–112)
Creatinine, Ser: 1.19 mg/dL (ref 0.40–1.50)
Glucose, Bld: 152 mg/dL — ABNORMAL HIGH (ref 70–99)
Potassium: 4.1 mEq/L (ref 3.5–5.3)
Sodium: 140 mEq/L (ref 135–145)
Total Bilirubin: 0.7 mg/dL (ref 0.3–1.2)
Total Protein: 7 g/dL (ref 6.0–8.3)

## 2010-10-30 LAB — TESTOSTERONE: Testosterone: 223.4 ng/dL — ABNORMAL LOW (ref 250–890)

## 2010-10-30 LAB — FERRITIN: Ferritin: 29 ng/mL (ref 22–322)

## 2010-11-20 ENCOUNTER — Encounter (HOSPITAL_BASED_OUTPATIENT_CLINIC_OR_DEPARTMENT_OTHER): Payer: BC Managed Care – PPO | Admitting: Hematology & Oncology

## 2010-11-20 DIAGNOSIS — E291 Testicular hypofunction: Secondary | ICD-10-CM

## 2010-12-11 ENCOUNTER — Encounter (HOSPITAL_BASED_OUTPATIENT_CLINIC_OR_DEPARTMENT_OTHER): Payer: BC Managed Care – PPO | Admitting: Hematology & Oncology

## 2010-12-11 ENCOUNTER — Other Ambulatory Visit: Payer: Self-pay | Admitting: Hematology & Oncology

## 2010-12-11 DIAGNOSIS — D751 Secondary polycythemia: Secondary | ICD-10-CM

## 2010-12-11 DIAGNOSIS — E291 Testicular hypofunction: Secondary | ICD-10-CM

## 2010-12-11 LAB — CBC WITH DIFFERENTIAL (CANCER CENTER ONLY)
BASO#: 0 10*3/uL (ref 0.0–0.2)
EOS%: 2.6 % (ref 0.0–7.0)
HGB: 15.6 g/dL (ref 13.0–17.1)
LYMPH#: 0.9 10*3/uL (ref 0.9–3.3)
MCH: 30.4 pg (ref 28.0–33.4)
MCHC: 35.1 g/dL (ref 32.0–35.9)
MONO%: 10.4 % (ref 0.0–13.0)
NEUT#: 5.4 10*3/uL (ref 1.5–6.5)
Platelets: 230 10*3/uL (ref 145–400)
RBC: 5.13 10*6/uL (ref 4.20–5.70)

## 2010-12-11 LAB — FERRITIN: Ferritin: 75 ng/mL (ref 22–322)

## 2010-12-11 LAB — IRON AND TIBC: Iron: 61 ug/dL (ref 42–165)

## 2011-01-01 ENCOUNTER — Encounter (HOSPITAL_BASED_OUTPATIENT_CLINIC_OR_DEPARTMENT_OTHER): Payer: BC Managed Care – PPO | Admitting: Hematology & Oncology

## 2011-01-01 ENCOUNTER — Other Ambulatory Visit: Payer: Self-pay | Admitting: Hematology & Oncology

## 2011-01-01 DIAGNOSIS — J329 Chronic sinusitis, unspecified: Secondary | ICD-10-CM

## 2011-01-01 DIAGNOSIS — E291 Testicular hypofunction: Secondary | ICD-10-CM

## 2011-01-01 LAB — CBC WITH DIFFERENTIAL (CANCER CENTER ONLY)
BASO%: 0.3 % (ref 0.0–2.0)
EOS%: 5.3 % (ref 0.0–7.0)
LYMPH#: 1.4 10*3/uL (ref 0.9–3.3)
MCH: 29.4 pg (ref 28.0–33.4)
MCHC: 34.2 g/dL (ref 32.0–35.9)
MONO%: 13.2 % — ABNORMAL HIGH (ref 0.0–13.0)
NEUT#: 6.4 10*3/uL (ref 1.5–6.5)
NEUT%: 66.9 % (ref 40.0–80.0)
RDW: 13.6 % (ref 11.1–15.7)

## 2011-01-01 LAB — COMPREHENSIVE METABOLIC PANEL
ALT: 31 U/L (ref 0–53)
AST: 20 U/L (ref 0–37)
Albumin: 4.1 g/dL (ref 3.5–5.2)
Alkaline Phosphatase: 73 U/L (ref 39–117)
BUN: 12 mg/dL (ref 6–23)
Calcium: 9.5 mg/dL (ref 8.4–10.5)
Chloride: 100 mEq/L (ref 96–112)
Potassium: 4.1 mEq/L (ref 3.5–5.3)
Sodium: 137 mEq/L (ref 135–145)
Total Protein: 6.8 g/dL (ref 6.0–8.3)

## 2011-01-23 ENCOUNTER — Encounter (HOSPITAL_BASED_OUTPATIENT_CLINIC_OR_DEPARTMENT_OTHER): Payer: BC Managed Care – PPO | Admitting: Hematology & Oncology

## 2011-01-23 DIAGNOSIS — E291 Testicular hypofunction: Secondary | ICD-10-CM

## 2011-03-06 ENCOUNTER — Other Ambulatory Visit: Payer: Self-pay | Admitting: Hematology & Oncology

## 2011-03-06 ENCOUNTER — Encounter (HOSPITAL_BASED_OUTPATIENT_CLINIC_OR_DEPARTMENT_OTHER): Payer: BC Managed Care – PPO | Admitting: Hematology & Oncology

## 2011-03-06 DIAGNOSIS — D751 Secondary polycythemia: Secondary | ICD-10-CM

## 2011-03-06 DIAGNOSIS — E291 Testicular hypofunction: Secondary | ICD-10-CM

## 2011-03-06 LAB — CBC WITH DIFFERENTIAL (CANCER CENTER ONLY)
BASO%: 0.2 % (ref 0.0–2.0)
EOS%: 4 % (ref 0.0–7.0)
Eosinophils Absolute: 0.4 10*3/uL (ref 0.0–0.5)
LYMPH%: 19.4 % (ref 14.0–48.0)
MCH: 29.2 pg (ref 28.0–33.4)
MCHC: 35.4 g/dL (ref 32.0–35.9)
MCV: 82 fL (ref 82–98)
MONO%: 7.2 % (ref 0.0–13.0)
NEUT#: 6.3 10*3/uL (ref 1.5–6.5)
Platelets: 253 10*3/uL (ref 145–400)
RBC: 5.59 10*6/uL (ref 4.20–5.70)
RDW: 14.5 % (ref 11.1–15.7)

## 2011-03-06 LAB — FERRITIN: Ferritin: 44 ng/mL (ref 22–322)

## 2011-03-06 LAB — TESTOSTERONE: Testosterone: 221.01 ng/dL — ABNORMAL LOW (ref 250–890)

## 2011-03-06 LAB — TECHNOLOGIST REVIEW CHCC SATELLITE

## 2011-05-16 ENCOUNTER — Encounter (HOSPITAL_BASED_OUTPATIENT_CLINIC_OR_DEPARTMENT_OTHER): Payer: BC Managed Care – PPO | Admitting: Hematology & Oncology

## 2011-05-16 ENCOUNTER — Other Ambulatory Visit: Payer: Self-pay | Admitting: Hematology & Oncology

## 2011-05-16 DIAGNOSIS — D751 Secondary polycythemia: Secondary | ICD-10-CM

## 2011-05-16 DIAGNOSIS — E291 Testicular hypofunction: Secondary | ICD-10-CM

## 2011-05-16 LAB — CBC WITH DIFFERENTIAL (CANCER CENTER ONLY)
BASO%: 0.1 % (ref 0.0–2.0)
EOS%: 4.4 % (ref 0.0–7.0)
MCH: 31.1 pg (ref 28.0–33.4)
MCHC: 36.5 g/dL — ABNORMAL HIGH (ref 32.0–35.9)
MONO%: 6.2 % (ref 0.0–13.0)
NEUT#: 6.9 10*3/uL — ABNORMAL HIGH (ref 1.5–6.5)
Platelets: 251 10*3/uL (ref 145–400)
RDW: 15.3 % (ref 11.1–15.7)

## 2011-05-16 LAB — FERRITIN: Ferritin: 42 ng/mL (ref 22–322)

## 2011-05-16 LAB — LACTATE DEHYDROGENASE: LDH: 200 U/L (ref 94–250)

## 2011-07-16 ENCOUNTER — Encounter (HOSPITAL_BASED_OUTPATIENT_CLINIC_OR_DEPARTMENT_OTHER): Payer: BC Managed Care – PPO | Admitting: Hematology & Oncology

## 2011-07-16 ENCOUNTER — Other Ambulatory Visit: Payer: Self-pay | Admitting: Family

## 2011-07-16 DIAGNOSIS — D751 Secondary polycythemia: Secondary | ICD-10-CM

## 2011-07-16 DIAGNOSIS — E291 Testicular hypofunction: Secondary | ICD-10-CM

## 2011-07-16 LAB — CBC WITH DIFFERENTIAL (CANCER CENTER ONLY)
BASO%: 0.2 % (ref 0.0–2.0)
EOS%: 2.7 % (ref 0.0–7.0)
LYMPH#: 1.8 10*3/uL (ref 0.9–3.3)
MCHC: 36.9 g/dL — ABNORMAL HIGH (ref 32.0–35.9)
NEUT#: 9.2 10*3/uL — ABNORMAL HIGH (ref 1.5–6.5)
RDW: 13.7 % (ref 11.1–15.7)

## 2011-09-25 ENCOUNTER — Ambulatory Visit (HOSPITAL_BASED_OUTPATIENT_CLINIC_OR_DEPARTMENT_OTHER): Payer: BC Managed Care – PPO | Admitting: Hematology & Oncology

## 2011-09-25 ENCOUNTER — Other Ambulatory Visit (HOSPITAL_BASED_OUTPATIENT_CLINIC_OR_DEPARTMENT_OTHER): Payer: BC Managed Care – PPO | Admitting: Lab

## 2011-09-25 ENCOUNTER — Other Ambulatory Visit: Payer: Self-pay | Admitting: Hematology & Oncology

## 2011-09-25 ENCOUNTER — Ambulatory Visit (HOSPITAL_BASED_OUTPATIENT_CLINIC_OR_DEPARTMENT_OTHER): Payer: BC Managed Care – PPO

## 2011-09-25 DIAGNOSIS — D751 Secondary polycythemia: Secondary | ICD-10-CM

## 2011-09-25 DIAGNOSIS — E291 Testicular hypofunction: Secondary | ICD-10-CM

## 2011-09-25 DIAGNOSIS — D45 Polycythemia vera: Secondary | ICD-10-CM

## 2011-09-25 DIAGNOSIS — I10 Essential (primary) hypertension: Secondary | ICD-10-CM

## 2011-09-25 LAB — CBC WITH DIFFERENTIAL (CANCER CENTER ONLY)
BASO%: 0.3 % (ref 0.0–2.0)
Eosinophils Absolute: 0.4 10*3/uL (ref 0.0–0.5)
MCH: 31 pg (ref 28.0–33.4)
MONO#: 0.9 10*3/uL (ref 0.1–0.9)
MONO%: 9.1 % (ref 0.0–13.0)
NEUT#: 6.9 10*3/uL — ABNORMAL HIGH (ref 1.5–6.5)
Platelets: 245 10*3/uL (ref 145–400)
RBC: 5.65 10*6/uL (ref 4.20–5.70)
WBC: 9.8 10*3/uL (ref 4.0–10.0)

## 2011-09-25 LAB — IRON AND TIBC: %SAT: 55 % (ref 20–55)

## 2011-09-25 LAB — FERRITIN: Ferritin: 45 ng/mL (ref 22–322)

## 2011-09-25 MED ORDER — AMLODIPINE BESY-BENAZEPRIL HCL 10-20 MG PO CAPS: 1.0000 | ORAL_CAPSULE | Freq: Every day | ORAL | Status: DC

## 2011-09-25 MED ORDER — SILDENAFIL CITRATE 100 MG PO TABS: 100.0000 mg | ORAL_TABLET | Freq: Once | ORAL | Status: DC

## 2011-09-25 NOTE — Progress Notes (Signed)
This office note has been dictated.

## 2011-09-26 ENCOUNTER — Telehealth: Payer: Self-pay | Admitting: *Deleted

## 2011-09-26 NOTE — Progress Notes (Signed)
CC:   Lillia Carmel, M.D.  DIAGNOSES: 1. Hemochromatosis. 2. 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 3. Hypotestosteronemia.  CURRENT THERAPY: 1. Phlebotomy to maintain hematocrit less than 48%. 2. Phlebotomy to maintain ferritin below 100. 3. Depo-Testosterone 300 mg IM every 2 weeks (the patient does at     home).  INTERIM HISTORY:  Mr. Burmester comes in for his follow-up.  He feels like he needs a phlebotomy.  He unfortunately ran out of his blood pressure medications.  He is on Lotrel (10/20).  He really does not keep up with his family doctor too often.  I did go ahead and refill his blood pressure medication.  He is doing well with the testosterone at home.  This definitely has been a big contributor to his secondary polycythemia.  When we last saw him in October, his ferritin was 41.  His testosterone level back in August was 518.  He is working without any problems.  He has had no fever.  He has had no cough.  He has had no change in bowel or bladder habits.  He is on Viagra to help with ED.  PHYSICAL EXAMINATION:  General Appearance:  This is a well-developed, well-nourished white gentleman in no obvious distress.  Vital Signs: 97.4, pulse 95, respiratory rate 20, blood pressure 182/106.  Weight is 262.  Head and Neck Exam:  Shows a normocephalic, atraumatic skull. There are no ocular or oral lesions.  There are no palpable cervical or supraclavicular lymph nodes.  Lungs:  Clear bilaterally.  Cardiac Exam: Regular rate and rhythm with a normal S1 and S2.  There are no murmurs, rubs or bruits.  Abdominal Exam:  Soft with good bowel sounds.  There is no palpable abdominal mass.  There is no fluid wave.  There is no palpable hepatosplenomegaly.  Back Exam:  No tenderness over the spine, ribs or hips.  Extremities:  Show no clubbing, cyanosis or edema.  Skin exam does show some generalized plethora with his skin.  His conjunctivae are slightly  injected.  LABORATORY STUDIES:  White cell count is 9.8, hemoglobin 17.5, hematocrit 49.5, platelet count 245.  IMPRESSION:  Mr. Rodger is a 50 year old gentleman with hemochromatosis. This really has not been a problem.  We phlebotomize him because of his secondary polycythemia.  We will go ahead and phlebotomize him today. This will make him feel better.  Clearly, getting back on his blood pressure medication will definitely make him feel better.  Again, he is somewhat "lax" with his general medical care.  We will plan to get him back to see [place] in another 6 weeks or so.    ______________________________ [name], M.D. PRE/MEDQ  D:  09/25/2011  T:  09/26/2011  Job:  [id number]

## 2011-09-26 NOTE — Telephone Encounter (Signed)
Mailed 2-28 schedule

## 2011-11-28 ENCOUNTER — Other Ambulatory Visit: Payer: Self-pay | Admitting: *Deleted

## 2011-11-28 DIAGNOSIS — I10 Essential (primary) hypertension: Secondary | ICD-10-CM

## 2011-11-28 DIAGNOSIS — D45 Polycythemia vera: Secondary | ICD-10-CM

## 2011-11-28 DIAGNOSIS — E291 Testicular hypofunction: Secondary | ICD-10-CM

## 2011-11-28 MED ORDER — TESTOSTERONE CYPIONATE 200 MG/ML IM SOLN: 200.0000 mg | INTRAMUSCULAR | Status: DC

## 2011-12-03 ENCOUNTER — Ambulatory Visit (HOSPITAL_BASED_OUTPATIENT_CLINIC_OR_DEPARTMENT_OTHER): Payer: BC Managed Care – PPO | Admitting: Hematology & Oncology

## 2011-12-03 ENCOUNTER — Other Ambulatory Visit (HOSPITAL_BASED_OUTPATIENT_CLINIC_OR_DEPARTMENT_OTHER): Payer: BC Managed Care – PPO | Admitting: Lab

## 2011-12-03 ENCOUNTER — Ambulatory Visit (HOSPITAL_BASED_OUTPATIENT_CLINIC_OR_DEPARTMENT_OTHER): Payer: BC Managed Care – PPO

## 2011-12-03 VITALS — BP 147/93 | HR 101 | Temp 97.8°F | Ht 69.0 in | Wt 253.0 lb

## 2011-12-03 DIAGNOSIS — E291 Testicular hypofunction: Secondary | ICD-10-CM

## 2011-12-03 DIAGNOSIS — N529 Male erectile dysfunction, unspecified: Secondary | ICD-10-CM

## 2011-12-03 DIAGNOSIS — I10 Essential (primary) hypertension: Secondary | ICD-10-CM

## 2011-12-03 DIAGNOSIS — D45 Polycythemia vera: Secondary | ICD-10-CM

## 2011-12-03 LAB — CBC WITH DIFFERENTIAL (CANCER CENTER ONLY)
BASO#: 0 10*3/uL (ref 0.0–0.2)
Eosinophils Absolute: 0.3 10*3/uL (ref 0.0–0.5)
HCT: 50.5 % — ABNORMAL HIGH (ref 38.7–49.9)
HGB: 17.9 g/dL — ABNORMAL HIGH (ref 13.0–17.1)
MCH: 30.7 pg (ref 28.0–33.4)
MONO%: 7.1 % (ref 0.0–13.0)
NEUT#: 6.6 10*3/uL — ABNORMAL HIGH (ref 1.5–6.5)
RBC: 5.84 10*6/uL — ABNORMAL HIGH (ref 4.20–5.70)

## 2011-12-03 LAB — FERRITIN: Ferritin: 76 ng/mL (ref 22–322)

## 2011-12-03 LAB — TESTOSTERONE: Testosterone: 221.5 ng/dL — ABNORMAL LOW (ref 250–890)

## 2011-12-03 MED ORDER — TESTOSTERONE CYPIONATE 200 MG/ML IM SOLN: 200.0000 mg | INTRAMUSCULAR | Status: DC

## 2011-12-03 MED ORDER — TESTOSTERONE CYPIONATE 200 MG/ML IM SOLN
300.0000 mg | Freq: Once | INTRAMUSCULAR | Status: AC
  Administered 2011-12-03: 300 mg via INTRAMUSCULAR

## 2011-12-03 MED ORDER — TADALAFIL 5 MG PO TABS: 5.0000 mg | ORAL_TABLET | Freq: Every day | ORAL | Status: DC | PRN

## 2011-12-03 NOTE — Progress Notes (Signed)
This office note has been dictated.

## 2011-12-03 NOTE — Progress Notes (Signed)
Kevin Wiggins presents today for phlebotomy per MD orders. Phlebotomy procedure started [id number] and ended at 1540 500 grams removed. Patient observed for 30 minutes after procedure without any incident. Patient tolerated procedure well. IV needle removed intact. Last set of vital sign BP at 1600 BP-129/81 P-101 RR 20 T- 97.0

## 2011-12-03 NOTE — Patient Instructions (Addendum)

## 2011-12-04 ENCOUNTER — Telehealth: Payer: Self-pay | Admitting: Hematology & Oncology

## 2011-12-04 NOTE — Progress Notes (Signed)
CC:   Lillia Carmel, M.D.  DIAGNOSES: 1. Hemochromatosis. 2. Secondary polycythemia secondary to testosterone-replacement     therapy. 3. Hypogonadism.  CURRENT THERAPY: 1. Phlebotomy to maintain hematocrit less than 48%. 2. Depo-Testosterone 200 mg IM every 3 weeks.  INTERIM HISTORY:  [name] comes in for follow-up.  He is feeling a little bit slow today.  He ran out of his testosterone at home.  The pharmacy was not going to refill this as his refills ran out.  He was waiting until he came back to see [place] in order to get another prescription and injection.  We will go ahead and give him a testosterone injection today in the office.  I will give him 300 mg to try to "jump start things."  He does have erectile dysfunction.  This may be secondary to the hypogonadism.  I did write for some daily Cialis (5 mg p.o. daily).  He has not had any problems with his hemochromatosis.  He really gets phlebotomized on a regular basis such that his ferritin has never been above 100.  He is still working.  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  He is doing better with his blood pressure medication.  He is on Lotrel (10/20), and this is helping with his blood pressure control.  He has no headache.  He has had no double vision or blurred vision.  He has had no nausea or vomiting.  There has been no change in bowel or bladder habits.  PHYSICAL EXAMINATION:  General Appearance:  This is a well-developed, well-nourished white gentleman in no obvious distress.  Vital Signs: 97.8, pulse 101, respiratory rate 18, blood pressure 147/93.  Weight is 253.  Head and Neck Exam:  Shows a normocephalic, atraumatic skull. There are no ocular or oral lesions.  There are no palpable cervical or supraclavicular lymph nodes.  His lungs are clear bilaterally.  Cardiac Exam:  Regular rate and rhythm with a normal S1 and S2.  There are no murmurs, rubs or bruits.   Abdominal Exam:  Soft with good bowel sounds. There is no palpable abdominal mass.  There is no fluid wave.  There is no palpable hepatosplenomegaly.  Back Exam:  No tenderness over the spine, ribs or hips.  Extremities:  Show no clubbing, cyanosis or edema. Neurological Exam:  Shows no focal neurological deficits.  Skin Exam: Does show a ruddy complexion with his face and extremities.  LABORATORY STUDIES:  Show a white cell count of 9.4, hemoglobin 18, hematocrit 51, platelet count 261.  IMPRESSION:  Mr. Gildersleeve is a 51 year old gentleman with hemochromatosis. This really has not been an "active issue" for him.  Again, his last ferritin was 45 back in December.  He definitely needs to be phlebotomized.  His quality of life really is dictated by his testosterone-replacement program.  We will go ahead and give him a dose today.  I then wrote a prescription for him to do at home.  He has been doing well with this at home.  I forgot to mention that he is taking aspirin.  I told him that he really needs to be diligent with taking aspirin.  I do want to see him back in another month or so just so that we can keep tabs on his blood count and make sure that his hematocrit is below 48.    ______________________________ [name], M.D. PRE/MEDQ  D:  12/03/2011  T:  12/04/2011  Job:  [id number]

## 2011-12-04 NOTE — Telephone Encounter (Signed)
Mailed 4-1 schedule

## 2011-12-08 ENCOUNTER — Other Ambulatory Visit: Payer: Self-pay | Admitting: *Deleted

## 2011-12-08 DIAGNOSIS — I10 Essential (primary) hypertension: Secondary | ICD-10-CM

## 2011-12-08 DIAGNOSIS — D45 Polycythemia vera: Secondary | ICD-10-CM

## 2011-12-08 DIAGNOSIS — E291 Testicular hypofunction: Secondary | ICD-10-CM

## 2011-12-08 MED ORDER — TESTOSTERONE CYPIONATE 200 MG/ML IM SOLN: 200.0000 mg | INTRAMUSCULAR | Status: DC

## 2011-12-08 NOTE — Telephone Encounter (Signed)
Received refill request from [facility] for Testosterone IM. Routed to Dr Myna Hidalgo for approval.

## 2011-12-29 ENCOUNTER — Telehealth: Payer: Self-pay | Admitting: Hematology & Oncology

## 2011-12-29 NOTE — Telephone Encounter (Signed)
Faxed completed "prior authorization request form" to [facility] for approval of Cialis.

## 2012-01-05 ENCOUNTER — Other Ambulatory Visit (HOSPITAL_BASED_OUTPATIENT_CLINIC_OR_DEPARTMENT_OTHER): Payer: BC Managed Care – PPO | Admitting: Lab

## 2012-01-05 ENCOUNTER — Encounter: Payer: BC Managed Care – PPO | Admitting: Hematology & Oncology

## 2012-01-06 ENCOUNTER — Other Ambulatory Visit: Payer: Self-pay | Admitting: *Deleted

## 2012-01-06 DIAGNOSIS — N529 Male erectile dysfunction, unspecified: Secondary | ICD-10-CM

## 2012-01-06 MED ORDER — TADALAFIL 5 MG PO TABS: 5.0000 mg | ORAL_TABLET | Freq: Every day | ORAL | Status: DC | PRN

## 2012-01-06 NOTE — Telephone Encounter (Signed)
Re-sent Cialis rx to [facility] after authorization was obtained for 8 tabs per month. Auth # [id number] included on rx.

## 2012-01-08 ENCOUNTER — Telehealth: Payer: Self-pay | Admitting: Hematology & Oncology

## 2012-01-08 NOTE — Telephone Encounter (Signed)
Pt called and resch 01/05/12 missed apt to 02/13/12

## 2012-02-13 ENCOUNTER — Ambulatory Visit: Payer: BC Managed Care – PPO | Admitting: Hematology & Oncology

## 2012-02-13 ENCOUNTER — Other Ambulatory Visit: Payer: BC Managed Care – PPO | Admitting: Lab

## 2012-02-20 ENCOUNTER — Ambulatory Visit (HOSPITAL_BASED_OUTPATIENT_CLINIC_OR_DEPARTMENT_OTHER): Payer: BC Managed Care – PPO | Admitting: Hematology & Oncology

## 2012-02-20 ENCOUNTER — Other Ambulatory Visit (HOSPITAL_BASED_OUTPATIENT_CLINIC_OR_DEPARTMENT_OTHER): Payer: BC Managed Care – PPO | Admitting: Lab

## 2012-02-20 ENCOUNTER — Ambulatory Visit (HOSPITAL_BASED_OUTPATIENT_CLINIC_OR_DEPARTMENT_OTHER): Payer: BC Managed Care – PPO

## 2012-02-20 DIAGNOSIS — D45 Polycythemia vera: Secondary | ICD-10-CM

## 2012-02-20 DIAGNOSIS — E349 Endocrine disorder, unspecified: Secondary | ICD-10-CM

## 2012-02-20 DIAGNOSIS — N411 Chronic prostatitis: Secondary | ICD-10-CM

## 2012-02-20 DIAGNOSIS — E291 Testicular hypofunction: Secondary | ICD-10-CM

## 2012-02-20 DIAGNOSIS — I10 Essential (primary) hypertension: Secondary | ICD-10-CM

## 2012-02-20 DIAGNOSIS — D751 Secondary polycythemia: Secondary | ICD-10-CM

## 2012-02-20 LAB — IRON AND TIBC
%SAT: 62 % — ABNORMAL HIGH (ref 20–55)
Iron: 207 ug/dL — ABNORMAL HIGH (ref 42–165)
TIBC: 332 ug/dL (ref 215–435)
UIBC: 125 ug/dL (ref 125–400)

## 2012-02-20 LAB — CBC WITH DIFFERENTIAL (CANCER CENTER ONLY)
BASO%: 0.2 % (ref 0.0–2.0)
LYMPH%: 15.6 % (ref 14.0–48.0)
MCH: 31.4 pg (ref 28.0–33.4)
MCHC: 35.7 g/dL (ref 32.0–35.9)
MCV: 88 fL (ref 82–98)
MONO%: 8.1 % (ref 0.0–13.0)
Platelets: 230 10*3/uL (ref 145–400)
RDW: 14.5 % (ref 11.1–15.7)

## 2012-02-20 LAB — FERRITIN: Ferritin: 41 ng/mL (ref 22–322)

## 2012-02-20 LAB — TESTOSTERONE: Testosterone: 178.1 ng/dL — ABNORMAL LOW (ref 300–890)

## 2012-02-20 NOTE — Progress Notes (Signed)
This office note has been dictated.

## 2012-02-20 NOTE — Progress Notes (Signed)
Kevin Wiggins presents today for phlebotomy per MD orders. Phlebotomy procedure started at 1440 and ended at 1455. 500 cc removed. Patient tolerated procedure well. IV needle removed intact.

## 2012-02-21 NOTE — Progress Notes (Signed)
CC:   Lillia Carmel, M.D.  DIAGNOSIS: 1. Hemochromatosis (H63D homozygous mutation). 2. Hypothyroidism. 3. Leukocytosis secondary to testosterone replacement therapy.  CURRENT THERAPY: 1. Phlebotomy to maintain hematocrit less than 48%. 2. Depo-Testosterone 200 mg IM q.2 weeks (patient does at home).  INTERIM HISTORY:  Kevin Wiggins comes in for followup.  He is a doing okay. He is still working.  He is not sure when he is going to retire.  He has had no problems with headache.  He has had no leg swelling.  He has had no fevers, sweats chills.  He has not noted any double vision or blurred vision.  He has not had any change in bowel or bladder habits.  His last ferritin back in February was 75.  His last testosterone also back in February was 221.  PHYSICAL EXAMINATION:  This is a well-developed, nourished white gentleman in no obvious distress.  Vital signs:  Temperature of 96.4, pulse 91, respiratory rate 22, blood pressure 141/94.  Weight is 255. Head and neck:  Normocephalic, atraumatic skull.  There are no ocular or oral lesions.  There are no palpable cervical or supraclavicular lymph nodes.  Lungs:  Clear bilaterally.  Cardiac: Regular rate and rhythm with a normal S1 and S2.  There are no murmurs, rubs or bruits. Abdomen: Soft with good bowel sounds.  There is no palpable abdominal mass.  There is no fluid wave.  There is no palpable hepatosplenomegaly. Back: No tenderness over the spine, ribs, or hips.  Extremities:  No clubbing, cyanosis or edema.  LABORATORY STUDIES:  White cell count 10.1, hemoglobin 17.4, hematocrit 48.8, platelet count 233, MCV is 88.  IMPRESSION:  Kevin Wiggins is a 51 year old gentleman with hemochromatosis. Again, he is homozygous for H63D mutation.  The hemochromatosis really has not been a problem.  He gets phlebotomized more so because of the erythrocytosis from his testosterone replacement therapy.  He does not respond to topical testosterone  replacement.  He needs to get IM injections.  He does this at home.  We will phlebotomize him today.  Will plan to get him back in another couple of months or so for followup.  I do not think we need any blood work in between visits.    ______________________________ [name], M.D. PRE/MEDQ  D:  02/20/2012  T:  02/21/2012  Job:  [id number]

## 2012-02-26 ENCOUNTER — Telehealth: Payer: Self-pay | Admitting: *Deleted

## 2012-02-26 NOTE — Telephone Encounter (Signed)
Called patient to let him know that his testosterone and labwork was good as was his iron levels so patient does not need a phlebotomy

## 2012-02-26 NOTE — Telephone Encounter (Signed)
Message copied by Anselm Jungling on Thu Feb 26, 2012 11:01 AM ------      Message from: Arlan Organ R      Created: Mon Feb 23, 2012  5:58 PM       Call - labs and testosterone are ok!!!  Iron is good, so no phlebotomy!!!  Cindee Lame

## 2012-04-21 ENCOUNTER — Ambulatory Visit (HOSPITAL_BASED_OUTPATIENT_CLINIC_OR_DEPARTMENT_OTHER): Payer: BC Managed Care – PPO | Admitting: Hematology & Oncology

## 2012-04-21 ENCOUNTER — Other Ambulatory Visit (HOSPITAL_BASED_OUTPATIENT_CLINIC_OR_DEPARTMENT_OTHER): Payer: BC Managed Care – PPO | Admitting: Lab

## 2012-04-21 ENCOUNTER — Ambulatory Visit (HOSPITAL_BASED_OUTPATIENT_CLINIC_OR_DEPARTMENT_OTHER): Payer: BC Managed Care – PPO

## 2012-04-21 DIAGNOSIS — E349 Endocrine disorder, unspecified: Secondary | ICD-10-CM

## 2012-04-21 DIAGNOSIS — D751 Secondary polycythemia: Secondary | ICD-10-CM

## 2012-04-21 DIAGNOSIS — N411 Chronic prostatitis: Secondary | ICD-10-CM

## 2012-04-21 DIAGNOSIS — E291 Testicular hypofunction: Secondary | ICD-10-CM

## 2012-04-21 LAB — CBC WITH DIFFERENTIAL (CANCER CENTER ONLY)
BASO%: 0.2 % (ref 0.0–2.0)
Eosinophils Absolute: 0.4 10*3/uL (ref 0.0–0.5)
LYMPH%: 21.7 % (ref 14.0–48.0)
MCH: 31.4 pg (ref 28.0–33.4)
MCV: 87 fL (ref 82–98)
MONO%: 10.6 % (ref 0.0–13.0)
Platelets: 228 10*3/uL (ref 145–400)
RDW: 14.5 % (ref 11.1–15.7)

## 2012-04-21 LAB — FERRITIN: Ferritin: 32 ng/mL (ref 22–322)

## 2012-04-21 NOTE — Progress Notes (Signed)
This office note has been dictated.

## 2012-04-21 NOTE — Patient Instructions (Signed)
Therapeutic Phlebotomy Care After Refer to this sheet in the next few weeks. These instructions provide you with information on caring for yourself after your procedure. Your caregiver may also give you more specific instructions. Your treatment has been planned according to current medical practices, but problems sometimes occur. Call your caregiver if you have any problems or questions after your procedure. HOME CARE INSTRUCTIONS Most people can go back to their normal activities right away. Before you leave, be sure to ask if there is anything you should or should not do. In general, it would be wise to:  Keep the bandage dry. You can remove the bandage after about 5 hours.   Eat well-balanced meals for the next 24 hours.   Drink enough fluids to keep your urine clear or pale yellow.   Avoid drinking alcohol minimally until after eating.   Avoid smoking for at least 30 minutes after the procedure.   Avoid strenous physical activity or heavy lifting or pulling for about 5 hours after the procedure.   Athletes should avoid strenous exercise for 12 hours or more.   Change positions slowly for the remainder of the day to prevent lightheadedness or fainting.   If you feel lightheaded, lie down until the feeling subsides.   If you have bleeding from the needle insertion site, elevate your arm and press firmly on the site until the bleeding stops.   If bruising or bleeding appears under the skin, apply ice to the area for 15 to 20 minutes, 3 to 4 times per day. Put the ice in a plastic bag and place a towel between the bag of ice and your skin. Do this while you are awake for the first 24 hours. The ice packs can be stopped before 24 hours if the swelling goes away. If swelling persists after 24 hours, a warm, moist washcloth can be applied to the area for 15 to 20 minutes, 3 to 4 times per day. The warm, moist treatments can be stopped when the swelling goes away.   It is important to  continue further therapeutic phlebotomy as directed by your caregiver.  SEEK MEDICAL CARE IF:  There is bleeding or fluid leaking from the needle insertion site.   The needle insertion site becomes swollen, red, or sore.   You feel lightheaded, dizzy or nauseated, and the feeling does not go away.   You notice new bruising at the needle insertion site.   You feel more weak or tired than normal.   You develop a fever.  SEEK IMMEDIATE MEDICAL CARE IF:   There is increased bleeding, pain, or swelling from the needle insertion site.   You have severe nausea or vomiting.   You have chest pain.   You have trouble breathing.  MAKE SURE YOU:  Understand these instructions.   Will watch your condition.   Will get help right away if you are not doing well or get worse.  Document Released: 02/24/2011 Document Revised: 06/04/2011 Document Reviewed: 02/24/2011 [name] Patient Information 2012 [name], [place].Title List Changes/Modifications

## 2012-04-22 NOTE — Progress Notes (Signed)
CC:   Kevin Wiggins, M.D.  DIAGNOSES: 1. Hemochromatosis (863D-homozygous). 2. Hypogonadism. 3. Erythrocytosis secondary to testosterone replacement therapy.  CURRENT THERAPY: 1. Phlebotomy to maintain hematocrit less than 48%. 2. Depot testosterone 200 mg IM q.2 weeks.  INTERIM HISTORY:  Kevin Wiggins comes in for followup.  He is doing okay.  He does feel a little bit tired.  He does feel a little bit "full."  He has had no nausea or vomiting.  He has a little bit of a headache.  He is still working full-time.  He did have a good time down at [facility] on vacation a few weeks ago.  He is being phlebotomized fairly aggressively.  This clearly does help his hemochromatosis.  His last ferritin was 41 back in May.  His last testosterone level in May was 178.  He has had no change in bowel or bladder habits.  He has had no cough. There has been no shortness of breath.  He does get a little bit more fatigued easily, which he feels might be from his blood being too high.  PHYSICAL EXAMINATION:  General:  This is a well-developed, well- nourished white gentleman in no obvious distress.  Vital signs: Temperature 97.2, pulse 85, respiratory rate 22, blood pressure 138/87. Weight is 259.  Head and neck:  Shows some slight facial plethora.  He has no conjunctival inflammation.  There is no oral lesions.  There is no adenopathy in the neck.  Lungs:  Clear bilaterally.  Cardiac: Regular rate and rhythm with a normal S1, S2.  There are no murmurs, rubs or bruits.  Abdomen:  Soft with good bowel sounds.  There is no palpable abdominal mass.  There is no palpable hepatosplenomegaly. Back:  No tenderness over the spine, ribs, or hips.  Extremities:  Shows no clubbing, cyanosis or edema.  Neurological:  Shows no focal neurological deficits.  Skin:  Exam does show a slight ruddy complexion.  LABORATORY STUDIES:  White cell count 8.8, hemoglobin 18.3, hematocrit 50.7, platelet count 228.  MCV  is 87.  IMPRESSION:  Kevin Wiggins is a 51 year old gentleman with hemochromatosis. The  fact that we phlebotomize him because of erythrocytosis is definitely beneficial.  Unfortunately, he clearly needs testosterone replacement.  Without this, he really has a hard time functioning and his quality of life is quite impaired.  We will plan to get him back in another month or so just so we can try to get a better handle on the erythrocytosis and be aggressive with phlebotomizing him.    ______________________________ Kevin Wiggins, M.D. PRE/MEDQ  D:  04/21/2012  T:  04/22/2012  Job:  [id number]

## 2012-05-17 ENCOUNTER — Other Ambulatory Visit (HOSPITAL_BASED_OUTPATIENT_CLINIC_OR_DEPARTMENT_OTHER): Payer: BC Managed Care – PPO | Admitting: Lab

## 2012-05-17 ENCOUNTER — Ambulatory Visit (HOSPITAL_BASED_OUTPATIENT_CLINIC_OR_DEPARTMENT_OTHER): Payer: BC Managed Care – PPO | Admitting: Hematology & Oncology

## 2012-05-17 DIAGNOSIS — D751 Secondary polycythemia: Secondary | ICD-10-CM

## 2012-05-17 DIAGNOSIS — E291 Testicular hypofunction: Secondary | ICD-10-CM

## 2012-05-17 DIAGNOSIS — E349 Endocrine disorder, unspecified: Secondary | ICD-10-CM

## 2012-05-17 LAB — CBC WITH DIFFERENTIAL (CANCER CENTER ONLY)
Eosinophils Absolute: 0.5 10*3/uL (ref 0.0–0.5)
LYMPH%: 23.9 % (ref 14.0–48.0)
MCH: 31.6 pg (ref 28.0–33.4)
MCV: 89 fL (ref 82–98)
MONO%: 9.9 % (ref 0.0–13.0)
NEUT%: 60.3 % (ref 40.0–80.0)
Platelets: 225 10*3/uL (ref 145–400)
RBC: 5.28 10*6/uL (ref 4.20–5.70)

## 2012-05-17 LAB — TESTOSTERONE: Testosterone: 439.43 ng/dL (ref 300–890)

## 2012-05-17 LAB — IRON AND TIBC: Iron: 71 ug/dL (ref 42–165)

## 2012-05-17 NOTE — Progress Notes (Signed)
This office note has been dictated.

## 2012-05-18 ENCOUNTER — Telehealth: Payer: Self-pay | Admitting: Hematology & Oncology

## 2012-05-18 NOTE — Telephone Encounter (Signed)
Mailed sept schedule °

## 2012-05-18 NOTE — Progress Notes (Signed)
CC:   Lillia Carmel, M.D.  DIAGNOSES: 1. Hemochromatosis (H63D homozygous mutation). 2. Hypogonadism. 3. Erythrocytosis.  CURRENT THERAPY: 1. Phlebotomy to maintain hematocrit less than 48%. 2. Testosterone 200 mg IM q.2 weeks, patient does at home.  INTERIM HISTORY:  Mr. Mustin comes in for followup.  He is feeling okay. He is a little bit sore.  He has been doing a lot of work on I think a car.  He has been going to the junk yard and getting parts.  He is on [employer] looking for parts.  He certainly enjoys doing this.  He has been feeling fairly well.  We are aggressive with his phlebotomy program.  His last ferritin back in July was 32.  His last testosterone was back in July was 224.  His PSA was 0.57.  He has had no headache.  He has had no fatigue.  He has had no arthralgias or myalgias.  There has been no change in bowel or bladder habits.  PHYSICAL EXAMINATION:  General:  This is a well-developed, well- nourished white gentleman in no obvious distress.  Vital Signs:  Show a temperature of 97.9, pulse 83, respiratory rate 20, blood pressure 141/91, weight is 263.  Head and Neck Exam:  Shows a normocephalic, atraumatic skull.  There are no ocular or oral lesions.  There are no palpable cervical or supraclavicular lymph nodes.  Lungs:  Clear bilaterally.  Cardiac:  Regular rate and rhythm with a normal S1 and S2. There are no murmurs, rubs, or bruits.  Abdominal Exam:  Soft with good bowel sounds.  There is no palpable abdominal mass.  There is no palpable hepatosplenomegaly.  Extremities:  Show no clubbing, cyanosis, or edema.  Neurological Exam:  Shows no focal neurological deficits.  LABORATORY STUDIES:  White cell count is 8.2, hemoglobin 16.7, hematocrit 47.1, platelet count is 225.  IMPRESSION:  Mr. Sumler is a 51 year old gentleman with hemochromatosis. This has not been a problem for [place].  He gets phlebotomized because of his testosterone replacement therapy causing  erythrocytosis.  He does not need to be phlebotomized today.  We will plan to go ahead and get him back in about 6 weeks for followup.    ______________________________ [name], M.D. PRE/MEDQ  D:  05/17/2012  T:  05/18/2012  Job:  [id number]

## 2012-06-30 ENCOUNTER — Other Ambulatory Visit (HOSPITAL_BASED_OUTPATIENT_CLINIC_OR_DEPARTMENT_OTHER): Payer: BC Managed Care – PPO | Admitting: Lab

## 2012-06-30 ENCOUNTER — Ambulatory Visit (HOSPITAL_BASED_OUTPATIENT_CLINIC_OR_DEPARTMENT_OTHER): Payer: BC Managed Care – PPO | Admitting: Hematology & Oncology

## 2012-06-30 ENCOUNTER — Ambulatory Visit (HOSPITAL_BASED_OUTPATIENT_CLINIC_OR_DEPARTMENT_OTHER): Payer: BC Managed Care – PPO

## 2012-06-30 DIAGNOSIS — E291 Testicular hypofunction: Secondary | ICD-10-CM

## 2012-06-30 DIAGNOSIS — D45 Polycythemia vera: Secondary | ICD-10-CM

## 2012-06-30 LAB — CBC WITH DIFFERENTIAL (CANCER CENTER ONLY)
BASO#: 0 10*3/uL (ref 0.0–0.2)
EOS%: 4.5 % (ref 0.0–7.0)
Eosinophils Absolute: 0.4 10*3/uL (ref 0.0–0.5)
LYMPH%: 22.6 % (ref 14.0–48.0)
MCH: 31.6 pg (ref 28.0–33.4)
MCHC: 35.7 g/dL (ref 32.0–35.9)
MCV: 89 fL (ref 82–98)
MONO%: 9.4 % (ref 0.0–13.0)
Platelets: 231 10*3/uL (ref 145–400)
RBC: 5.5 10*6/uL (ref 4.20–5.70)

## 2012-06-30 LAB — IRON AND TIBC: Iron: 152 ug/dL (ref 42–165)

## 2012-06-30 LAB — FERRITIN: Ferritin: 32 ng/mL (ref 22–322)

## 2012-06-30 NOTE — Patient Instructions (Signed)
Call with problems.

## 2012-06-30 NOTE — Progress Notes (Signed)
Kevin Wiggins presents today for phlebotomy per MD orders. Phlebotomy procedure started at 1613 and ended at 1622. 500 ml removed. Patient observed for 30 minutes after procedure without any incident. Patient tolerated procedure well. IV needle removed intact.

## 2012-06-30 NOTE — Progress Notes (Signed)
This office note has been dictated.

## 2012-07-01 ENCOUNTER — Encounter: Payer: Self-pay | Admitting: *Deleted

## 2012-07-01 NOTE — Progress Notes (Signed)
On 09/24/12 pt had a phlebotomy and of blood taken.  Pt tolerated well.

## 2012-07-01 NOTE — Progress Notes (Signed)
CC:   Lillia Carmel, M.D.  DIAGNOSES: 1. Hemochromatosis (H63D homozygous mutation). 2. Hypogonadism. 3. Erythrocytosis secondary to testosterone replacement.  CURRENT THERAPY: 1. Phlebotomy to maintain hematocrit less than 48. 2. Phlebotomy to maintain ferritin less than 100. 3. Testosterone 200 mg IM q.2 weeks, patient does at home.  INTERIM HISTORY:  Mr. Mankins comes in for his followup.  He is doing well. 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  He really enjoys this.  He has done well with his testosterone injections.  He does this every 2 weeks.  We phlebotomize him probably every 2 or 3 months.  When we last saw him in August, his ferritin was 17.  His last testosterone was 439 in August, so this is helping him with energy fortitude.  His last PSA in July was 0.57.  He has had no change in bowel or bladder habits.  He has had no nausea or vomiting.  He has had no leg swelling.  There have been no rashes.  PHYSICAL EXAMINATION:  This is a well-developed, well-nourished white gentleman in no obvious distress.  Vital signs:  Temperature of 97.8, a pulse 79, respiratory rate 20, blood pressure 159/83.  Weight is 265. Head and neck:  Normocephalic, atraumatic skull.  There are no ocular or oral lesions.  He has some slight plethora on his face.  Lungs:  Clear bilaterally.  Cardiac:  Regular rate and rhythm with a normal S1 and S2. There are no murmurs, rubs or bruits.  Abdomen:  Soft with good bowel sounds.  There is no palpable abdominal mass.  There is no palpable hepatosplenomegaly.  Back:  No tenderness over the spine, ribs or hips. Extremities:  No clubbing, cyanosis or edema.  Neurologic:  No focal neurological deficits.  LABORATORY STUDIES:  White cell count is 8.5, hemoglobin 17.4, hematocrit 48.8, platelet count 231.  IMPRESSION:  Ms. Mederos is a 51 year old gentleman with hemochromatosis. This  actually has not been a problem as we phlebotomize him because of erythrocytosis secondary to his testosterone supplementation.  We will go ahead and phlebotomize him today.  I do want to get him back in about 6 weeks' time.  I want to make sure that we stay aggressive with his phlebotomies.    ______________________________ [name], M.D. PRE/MEDQ  D:  06/30/2012  T:  07/01/2012  Job:  [id number]

## 2012-08-09 ENCOUNTER — Ambulatory Visit (HOSPITAL_BASED_OUTPATIENT_CLINIC_OR_DEPARTMENT_OTHER): Payer: BC Managed Care – PPO

## 2012-08-09 ENCOUNTER — Other Ambulatory Visit (HOSPITAL_BASED_OUTPATIENT_CLINIC_OR_DEPARTMENT_OTHER): Payer: BC Managed Care – PPO | Admitting: Lab

## 2012-08-09 ENCOUNTER — Ambulatory Visit (HOSPITAL_BASED_OUTPATIENT_CLINIC_OR_DEPARTMENT_OTHER): Payer: BC Managed Care – PPO | Admitting: Hematology & Oncology

## 2012-08-09 DIAGNOSIS — D751 Secondary polycythemia: Secondary | ICD-10-CM

## 2012-08-09 DIAGNOSIS — E291 Testicular hypofunction: Secondary | ICD-10-CM

## 2012-08-09 DIAGNOSIS — D45 Polycythemia vera: Secondary | ICD-10-CM

## 2012-08-09 LAB — CBC WITH DIFFERENTIAL (CANCER CENTER ONLY)
BASO%: 0.2 % (ref 0.0–2.0)
EOS%: 6 % (ref 0.0–7.0)
LYMPH#: 1.8 10*3/uL (ref 0.9–3.3)
MCHC: 35.6 g/dL (ref 32.0–35.9)
NEUT#: 5.7 10*3/uL (ref 1.5–6.5)
Platelets: 226 10*3/uL (ref 145–400)
RDW: 14.5 % (ref 11.1–15.7)

## 2012-08-09 LAB — COMPREHENSIVE METABOLIC PANEL
ALT: 35 U/L (ref 0–53)
CO2: 26 mEq/L (ref 19–32)
Calcium: 9.6 mg/dL (ref 8.4–10.5)
Chloride: 105 mEq/L (ref 96–112)
Creatinine, Ser: 1.25 mg/dL (ref 0.50–1.35)
Glucose, Bld: 110 mg/dL — ABNORMAL HIGH (ref 70–99)

## 2012-08-09 LAB — IRON AND TIBC
Iron: 83 ug/dL (ref 42–165)
TIBC: 344 ug/dL (ref 215–435)
UIBC: 261 ug/dL (ref 125–400)

## 2012-08-09 LAB — CHCC SATELLITE - SMEAR

## 2012-08-09 NOTE — Progress Notes (Signed)
Kevin Wiggins presents today for phlebotomy per MD orders. Phlebotomy procedure started at 1630 and ended at 1655. 500 grams removed. Patient observed for 30 minutes after procedure without any incident. Patient tolerated procedure well. IV needle removed intact.

## 2012-08-09 NOTE — Progress Notes (Signed)
This office note has been dictated.

## 2012-08-10 NOTE — Progress Notes (Signed)
CC:   Kevin Wiggins, M.D.  DIAGNOSES: 1. Hemochromatosis (H63D homozygous mutation). 2. Hypogonadism. 3. Erythrocytosis secondary to testosterone replacement therapy.  CURRENT THERAPY: 1. Phlebotomy to maintain hematocrit less than 48%. 2. Phlebotomy to maintain ferritin less than 100. 3. Testosterone 400 mg IM q.2 weeks.  INTERIM HISTORY:  Kevin Wiggins comes in for his followup.  He feels that the testosterone that he is taking is not helping him all that much.  As such, we will increase his dose to 400 mg.  He does this at home.  When we last saw him back in August, the testosterone was 439. He has had no problems with iron overload.  His last ferritin was 32 with an iron saturation of 44% back in September.  He has been eating well.  He has put on some weight.  He has had no cough.  He has had no change in bowel or bladder habits.  He has had no rashes.  There has been no leg swelling.  He has had no headache.  He is watching his blood pressure.  He is on blood pressure medication now.  PHYSICAL EXAMINATION:  General:  This is a mildly obese white gentleman in no obvious distress.  Vital signs:  Show temperature of 97.8, pulse 91, respiratory rate 20, blood pressure 148/90.  Weight is 272.  Head and neck:  Shows a normocephalic, atraumatic skull.  There are no ocular or oral lesions.  There are no palpable cervical or supraclavicular lymph nodes.  Lungs:  Clear bilaterally.  Cardiac:  Regular rate and rhythm with a normal S1, S2.  There are no murmurs, rubs or bruits. Abdomen:  Soft with good bowel sounds.  There is no palpable abdominal mass.  There is no palpable hepatosplenomegaly.  Back:  No tenderness over the spine, ribs or hips.  Extremities:  Shows no clubbing, cyanosis or edema.  Skin:  Exam does show some ruddy complexion.  LABORATORY STUDIES:  White cell count is 8.9, hemoglobin 16.8, hematocrit 47.2, platelet count 226.  MCV is 89.  IMPRESSION:  Kevin Wiggins is a  51 year old gentleman with hemochromatosis. This really has not been an issue for him.  His big issues has been the low testosterone and his quality of life.  Again, we will increase his testosterone up to 400 mg.  His last PSA was 0.57 back in July so this is okay.  I do want to go ahead and phlebotomize him.  With the holidays coming up I want to try to make sure that we optimize his blood count so that he does not run into problems.  I do want to get him back in about a month or so.  We will then see how he is doing and check his testosterone and iron.    ______________________________ Kevin Wiggins, M.D. PRE/MEDQ  D:  08/09/2012  T:  08/10/2012  Job:  [id number]

## 2012-08-31 ENCOUNTER — Other Ambulatory Visit: Payer: Self-pay | Admitting: Hematology & Oncology

## 2012-09-20 ENCOUNTER — Ambulatory Visit (HOSPITAL_BASED_OUTPATIENT_CLINIC_OR_DEPARTMENT_OTHER): Payer: BC Managed Care – PPO | Admitting: Hematology & Oncology

## 2012-09-20 ENCOUNTER — Other Ambulatory Visit (HOSPITAL_BASED_OUTPATIENT_CLINIC_OR_DEPARTMENT_OTHER): Payer: BC Managed Care – PPO | Admitting: Lab

## 2012-09-20 ENCOUNTER — Ambulatory Visit (HOSPITAL_BASED_OUTPATIENT_CLINIC_OR_DEPARTMENT_OTHER): Payer: BC Managed Care – PPO

## 2012-09-20 DIAGNOSIS — D751 Secondary polycythemia: Secondary | ICD-10-CM

## 2012-09-20 DIAGNOSIS — E291 Testicular hypofunction: Secondary | ICD-10-CM

## 2012-09-20 LAB — CBC WITH DIFFERENTIAL (CANCER CENTER ONLY)
Eosinophils Absolute: 0.5 10*3/uL (ref 0.0–0.5)
HCT: 49 % (ref 38.7–49.9)
HGB: 16.9 g/dL (ref 13.0–17.1)
LYMPH%: 14.7 % (ref 14.0–48.0)
MCV: 88 fL (ref 82–98)
MONO#: 0.8 10*3/uL (ref 0.1–0.9)
NEUT%: 72.1 % (ref 40.0–80.0)
RBC: 5.59 10*6/uL (ref 4.20–5.70)
WBC: 9.3 10*3/uL (ref 4.0–10.0)

## 2012-09-20 LAB — TESTOSTERONE: Testosterone: 1131.65 ng/dL — ABNORMAL HIGH (ref 300–890)

## 2012-09-20 LAB — IRON AND TIBC
%SAT: 21 % (ref 20–55)
Iron: 78 ug/dL (ref 42–165)

## 2012-09-20 MED ORDER — TESTOSTERONE CYPIONATE 200 MG/ML IM SOLN: 200.0000 mg | INTRAMUSCULAR | Status: DC

## 2012-09-20 NOTE — Progress Notes (Signed)
CC:   Kevin Wiggins, M.D.  DIAGNOSES: 1. Hemochromatosis (H63D mutation-homozygous). 2. Hypogonadism. 3. Erythrocytosis secondary to testosterone replacement therapy.  CURRENT THERAPY: 1. Phlebotomy to maintain hematocrit less than 48%. 2. Phlebotomy to maintain ferritin less than 100. 3. Testosterone 200 mg IM every 2 weeks.  INTERVAL HISTORY:  Kevin Wiggins comes in for followup.  He is doing okay. He had a good Thanksgiving.  He is getting ready for Christmas.  His hemochromatosis really has not been much of an issue.  His last ferritin was 20 when we saw him back in November.  His testosterone back in November was 320.  Does his testosterone at home.  He has had no problems working.  There is no fatigue or weakness.  He has had no cough.  He has had no leg swelling.  There has been no headache.  PHYSICAL EXAMINATION:  This is a well-developed, well-nourished white gentleman in no obvious distress.  Vital signs:  Temperature of 98, pulse 98.  Respiratory rate 18, blood pressure 146/80.  Weight is 273. Head and neck:  Normocephalic, atraumatic skull.  There are no ocular or oral lesions.  There are no palpable cervical or supraclavicular lymph nodes.  Lungs:  Clear bilaterally.  Cardiac:  Regular rate and rhythm with a normal S1 and S2.  There are no murmurs, rubs or bruits. Abdomen:  Soft with good bowel sounds.  There is no palpable abdominal mass.  There is no palpable hepatosplenomegaly.  Back:  No tenderness over the spine, ribs, or hips.  Extremities:  Shows no clubbing, cyanosis or edema.  Neurological:  Shows no focal neurological deficits.  LABORATORY STUDIES:  White cell count is 9.3, hemoglobin 16.9, hematocrit 49, platelet count is 258.  IMPRESSION:  Kevin Wiggins is a 51 year old gentleman with hemochromatosis. Again, this was not been much of an issue for him.  He gets his phlebotomies performed every 4-6 weeks.  We will phlebotomize him today.  He knows when his  blood count is up a little bit too much.  He just feels a little more sluggish.  We will plan to get him back in about 6 weeks' time. I did refill his testosterone prescription.    ______________________________ Kevin Wiggins, M.D. PRE/MEDQ  D:  09/20/2012  T:  09/20/2012  Job:  [id number]

## 2012-09-20 NOTE — Progress Notes (Signed)
Kevin Wiggins presents today for phlebotomy per MD orders. Phlebotomy procedure started at 1605 and ended at 1615. 500 ml removed. Patient observed for 30 minutes after procedure without any incident. Patient tolerated procedure well. IV needle removed intact.

## 2012-09-20 NOTE — Progress Notes (Signed)
This office note has been dictated.

## 2012-09-20 NOTE — Patient Instructions (Signed)

## 2012-09-21 ENCOUNTER — Telehealth: Payer: Self-pay | Admitting: Hematology & Oncology

## 2012-09-21 NOTE — Telephone Encounter (Signed)
Mailed 10-25-12 schedule

## 2012-09-22 ENCOUNTER — Telehealth: Payer: Self-pay | Admitting: *Deleted

## 2012-09-22 NOTE — Telephone Encounter (Addendum)
Message copied by Wynonia Hazard on Wed Sep 22, 2012 11:11 AM ------      Message from: Arlan Organ R      Created: Tue Sep 21, 2012  9:00 PM       Call - testosterone is way too high.  Change dose to 200mg  every 4 weeks!!!  Cindee Lame  Spoke to pt. Made aware of dose change.

## 2012-10-25 ENCOUNTER — Ambulatory Visit: Payer: BC Managed Care – PPO | Admitting: Hematology & Oncology

## 2012-10-25 ENCOUNTER — Other Ambulatory Visit: Payer: BC Managed Care – PPO | Admitting: Lab

## 2012-10-25 ENCOUNTER — Telehealth: Payer: Self-pay | Admitting: Hematology & Oncology

## 2012-10-25 NOTE — Telephone Encounter (Signed)
Patient called and cx 10/25/12 apt and resch for 11/08/12 due to being sick.  Nurse was notified of cx apt

## 2012-11-08 ENCOUNTER — Ambulatory Visit (HOSPITAL_BASED_OUTPATIENT_CLINIC_OR_DEPARTMENT_OTHER): Payer: BC Managed Care – PPO | Admitting: Hematology & Oncology

## 2012-11-08 ENCOUNTER — Ambulatory Visit (HOSPITAL_BASED_OUTPATIENT_CLINIC_OR_DEPARTMENT_OTHER): Payer: BC Managed Care – PPO

## 2012-11-08 ENCOUNTER — Other Ambulatory Visit (HOSPITAL_BASED_OUTPATIENT_CLINIC_OR_DEPARTMENT_OTHER): Payer: BC Managed Care – PPO | Admitting: Lab

## 2012-11-08 DIAGNOSIS — E291 Testicular hypofunction: Secondary | ICD-10-CM

## 2012-11-08 LAB — CBC WITH DIFFERENTIAL (CANCER CENTER ONLY)
BASO#: 0 10*3/uL (ref 0.0–0.2)
BASO%: 0.5 % (ref 0.0–2.0)
EOS%: 5.4 % (ref 0.0–7.0)
HCT: 48.9 % (ref 38.7–49.9)
HGB: 16.9 g/dL (ref 13.0–17.1)
MCH: 29.5 pg (ref 28.0–33.4)
MCHC: 34.6 g/dL (ref 32.0–35.9)
MONO%: 8.5 % (ref 0.0–13.0)
NEUT#: 4.8 10*3/uL (ref 1.5–6.5)
NEUT%: 63.2 % (ref 40.0–80.0)
RDW: 14 % (ref 11.1–15.7)

## 2012-11-08 LAB — TESTOSTERONE: Testosterone: 188 ng/dL — ABNORMAL LOW (ref 300–890)

## 2012-11-08 NOTE — Progress Notes (Signed)
Kevin Wiggins presents today for phlebotomy per MD orders. Phlebotomy procedure started at 1445 and ended at 1500. 500 grams removed. Patient observed for 30 minutes after procedure without any incident. Patient tolerated procedure well. IV needle removed intact.

## 2012-11-08 NOTE — Progress Notes (Signed)
This office note has been dictated.

## 2012-11-08 NOTE — Patient Instructions (Addendum)

## 2012-11-09 NOTE — Progress Notes (Signed)
CC:   Kevin Wiggins, M.D.  DIAGNOSES: 1. Hemochromatosis (H63D homozygous mutation). 2. Hypogonadism secondary to hemochromatosis. 3. Erythrocytosis secondary to testosterone replacement therapy.  CURRENT THERAPY: 1. Phlebotomy to maintain hematocrit less than 48%. 2. Testosterone 200 mg IM q.2 weeks (the patient does at home).  INTERIM HISTORY:  Kevin Wiggins comes in for his followup.  He is feeling tired.  He has been off his testosterone since we last saw him.  When we saw him, his testosterone was 1130 mg/dL.  As such, I told him to hold off as this level was quite high.  Again, he is feeling quite tired.  He is feeling quite fatigued.  He does have some degree of iron deficiency from his phlebotomies.  His last ferritin was 17 in December.  He is still working.  He does not have as much energy since stopping the testosterone.  He has had no fevers, sweats or chills.  He says he is getting over a "cold" which he has had for about a week.  He has had no rashes.  He has had no cough.  There has been no change in bowel or bladder habits.  PHYSICAL EXAMINATION:  General:  This is a stocky, white gentleman in no obvious distress.  Vital signs:  Show temperature of 97.9, pulse 81, respiratory rate 18, blood pressure 149/84.  Weight is 266.  Head and neck:  Shows a normocephalic, atraumatic skull.  There are no ocular or oral lesions.  There are no palpable cervical or supraclavicular lymph nodes.  He does have some facial plethora.  Lungs:  Clear bilaterally. Cardiac:  Regular rate and rhythm with a normal S1, S2.  There are no murmurs, rubs or bruits.  Abdomen:  Soft with good bowel sounds.  There is no palpable abdominal mass.  There is no fluid wave.  There is no palpable hepatosplenomegaly.  Back:  No tenderness over the spine, ribs or hips.  Extremities:  Show no clubbing, cyanosis or edema. Neurologic:  No focal neurological deficit.  LABORATORY STUDIES:  White cell  count 7.5, hemoglobin 17, hematocrit 49, platelet count 239.  IMPRESSION:  Kevin Wiggins is a 52 year old gentleman with hemochromatosis. Again, this is very well-controlled.  His phlebotomies that we do for his erythrocytosis really has kept his ferritin level down and I do not see any complications from the hemochromatosis outside of the hypogonadism.  We will phlebotomize him today.  I think this is reasonable since he is going to get started back on his testosterone replacement therapy.  We will plan to get him back in about 6 weeks' time.  I think this is a reasonable time frame for him.    ______________________________ [name], M.D. PRE/MEDQ  D:  11/08/2012  T:  11/09/2012  Job:  [id number]

## 2012-12-06 ENCOUNTER — Ambulatory Visit (HOSPITAL_BASED_OUTPATIENT_CLINIC_OR_DEPARTMENT_OTHER): Payer: BC Managed Care – PPO | Admitting: Medical

## 2012-12-06 ENCOUNTER — Other Ambulatory Visit (HOSPITAL_BASED_OUTPATIENT_CLINIC_OR_DEPARTMENT_OTHER): Payer: BC Managed Care – PPO | Admitting: Lab

## 2012-12-06 DIAGNOSIS — I10 Essential (primary) hypertension: Secondary | ICD-10-CM

## 2012-12-06 DIAGNOSIS — E291 Testicular hypofunction: Secondary | ICD-10-CM

## 2012-12-06 LAB — CBC WITH DIFFERENTIAL (CANCER CENTER ONLY)
BASO%: 0.2 % (ref 0.0–2.0)
LYMPH%: 21.6 % (ref 14.0–48.0)
MCH: 29.6 pg (ref 28.0–33.4)
MCV: 86 fL (ref 82–98)
MONO%: 10.2 % (ref 0.0–13.0)
Platelets: 223 10*3/uL (ref 145–400)
RDW: 14.6 % (ref 11.1–15.7)

## 2012-12-06 MED ORDER — AMLODIPINE BESY-BENAZEPRIL HCL 10-20 MG PO CAPS: 1.0000 | ORAL_CAPSULE | Freq: Every day | ORAL | Status: DC

## 2012-12-06 MED ORDER — SILDENAFIL CITRATE 100 MG PO TABS: 50.0000 mg | ORAL_TABLET | ORAL | Status: DC | PRN

## 2012-12-06 NOTE — Progress Notes (Signed)
DIAGNOSES: 1. Hemochromatosis (H63D homozygous mutation). 2. Hypogonadism secondary to hemochromatosis. 3. Erythrocytosis secondary to testosterone replacement therapy.  CURRENT THERAPY: 1. Phlebotomy to maintain hematocrit less than 48%. 2. Testosterone 200 mg IM q.2 weeks (the patient does at home).  INTERIM HISTORY:  Kevin Wiggins presents today for an office followup visit.  Overall, he, reports, that he's doing relatively well.  He still has some fatigue.  I do believe.  He is back on his testosterone injections.  The last, time, we checked his testosterone, it was 188.  He did have to discontinue his injections a couple months ago, as his testosterone level was 1131.  He does have a degree of iron deficiency anemia, secondary to his phlebotomies.  His last ferritin was 50.  He still continues to work and perform his activities of daily living without any hindrance or decline.  He's not reporting any vision, changes, or headaches.  He, reports, he has a good appetite.  He denies any nausea, vomiting, diarrhea, constipation, chest pain, shortness breath, or cough.  He denies any fevers, chills, or night sweats.  He denies any obvious, bleeding.  He denies any mucositis.  He denies any rashes.  We do like to keep his hematocrit less than 48%.  His hematocrit today is 47.8.  We will not phlebotomize him today.     Review of Systems: Constitutional:Negative for malaise/fatigue, fever, chills, weight loss, diaphoresis, activity change, appetite change, and unexpected weight change.  HEENT: Negative for double vision, blurred vision, visual loss, ear pain, tinnitus, congestion, rhinorrhea, epistaxis sore throat or sinus disease, oral pain/lesion, tongue soreness Respiratory: Negative for cough, chest tightness, shortness of breath, wheezing and stridor.  Cardiovascular: Negative for chest pain, palpitations, leg swelling, orthopnea, PND, DOE or claudication Gastrointestinal: Negative for nausea, vomiting,  abdominal pain, diarrhea, constipation, blood in stool, melena, hematochezia, abdominal distention, anal bleeding, rectal pain, anorexia and hematemesis.  Genitourinary: Negative for dysuria, frequency, hematuria,  Musculoskeletal: Negative for myalgias, back pain, joint swelling, arthralgias and gait problem.  Skin: Negative for rash, color change, pallor and wound.  Neurological:. Negative for dizziness/light-headedness, tremors, seizures, syncope, facial asymmetry, speech difficulty, weakness, numbness, headaches and paresthesias.  Hematological: Negative for adenopathy. Does not bruise/bleed easily.  Psychiatric/Behavioral:  Negative for depression, no loss of interest in normal activity or change in sleep pattern.   Physical Exam: This is a pleasant, 52 year old, well-developed, well-nourished, white gentleman, in no obvious distress Vitals: temperature 98.1 degrees, pulse 91, respirations 18, blood pressure 135/66, weight 272 pounds  HEENT reveals a normocephalic, atraumatic skull, no scleral icterus, no oral lesions  Neck is supple without any cervical or supraclavicular adenopathy.  Lungs are clear to auscultation bilaterally. There are no wheezes, rales or rhonci Cardiac is regular rate and rhythm with a normal S1 and S2. There are no murmurs, rubs, or bruits.  Abdomen is soft with good bowel sounds, there is no palpable mass. There is no palpable hepatosplenomegaly. There is no palpable fluid wave.  Musculoskeletal no tenderness of the spine, ribs, or hips.  Extremities there are no clubbing, cyanosis, or edema.  Skin no petechia, purpura or ecchymosis Neurologic is nonfocal.  Laboratory Data: White count 8.3, hemoglobin 16.5, hematocrit 47.8, platelets 223,000  Current Outpatient Prescriptions on File Prior to Visit  Medication Sig Dispense Refill  . amLODipine-benazepril (LOTREL) 10-20 MG per capsule Take 1 capsule by mouth daily.  30 capsule  6  . testosterone cypionate  (DEPOTESTOTERONE CYPIONATE) 200 MG/ML injection Inject 1 mL (200 mg  total) into the muscle every 14 (fourteen) days.  20 mL  4  . VIAGRA 100 MG tablet 50 mg as needed.        No current facility-administered medications on file prior to visit.   Assessment/Plan: This is a pleasant, 52 year old, white male, with the following issues:  #1.Hemochromatosis.  Again, this is very, well controlled.  His phlebotomies, that we do for his erythrocytosis really has kept his ferritin level down.  There are no complications from hemochromatosis outside of the hypogonadism.His hematocrit below 48%, today, as, such, she will not need a phlebotomy.  #2.  Hypogonadism.  This is secondary to hemochromatosis.  He does give himself, testosterone injections 200 mg IM every 2 weeks.  #3.  Followup.  We will follow back up with Kevin Wiggins, in 6 weeks, but before then should there be questions or concerns.

## 2012-12-07 LAB — TESTOSTERONE: Testosterone: 173 ng/dL — ABNORMAL LOW (ref 300–890)

## 2012-12-07 LAB — IRON AND TIBC: %SAT: 32 % (ref 20–55)

## 2013-01-24 ENCOUNTER — Other Ambulatory Visit (HOSPITAL_BASED_OUTPATIENT_CLINIC_OR_DEPARTMENT_OTHER): Payer: BC Managed Care – PPO | Admitting: Lab

## 2013-01-24 ENCOUNTER — Ambulatory Visit (HOSPITAL_BASED_OUTPATIENT_CLINIC_OR_DEPARTMENT_OTHER): Payer: BC Managed Care – PPO

## 2013-01-24 ENCOUNTER — Ambulatory Visit (HOSPITAL_BASED_OUTPATIENT_CLINIC_OR_DEPARTMENT_OTHER): Payer: BC Managed Care – PPO | Admitting: Hematology & Oncology

## 2013-01-24 VITALS — BP 152/88 | HR 97 | Temp 97.0°F | Resp 20 | Wt 269.0 lb

## 2013-01-24 DIAGNOSIS — E291 Testicular hypofunction: Secondary | ICD-10-CM

## 2013-01-24 DIAGNOSIS — D751 Secondary polycythemia: Secondary | ICD-10-CM

## 2013-01-24 LAB — CBC WITH DIFFERENTIAL (CANCER CENTER ONLY)
BASO#: 0 10*3/uL (ref 0.0–0.2)
EOS%: 3.9 % (ref 0.0–7.0)
LYMPH%: 20 % (ref 14.0–48.0)
MCH: 30.1 pg (ref 28.0–33.4)
MCHC: 35.3 g/dL (ref 32.0–35.9)
MCV: 85 fL (ref 82–98)
MONO%: 9 % (ref 0.0–13.0)
NEUT#: 5.4 10*3/uL (ref 1.5–6.5)
RBC: 5.71 10*6/uL — ABNORMAL HIGH (ref 4.20–5.70)
WBC: 8.1 10*3/uL (ref 4.0–10.0)

## 2013-01-24 LAB — TESTOSTERONE: Testosterone: 214 ng/dL — ABNORMAL LOW (ref 300–890)

## 2013-01-24 LAB — IRON AND TIBC: Iron: 168 ug/dL — ABNORMAL HIGH (ref 42–165)

## 2013-01-24 MED ORDER — TESTOSTERONE CYPIONATE 200 MG/ML IM SOLN: 200.0000 mg | INTRAMUSCULAR | Status: DC

## 2013-01-24 NOTE — Progress Notes (Signed)
This office note has been dictated.

## 2013-01-24 NOTE — Progress Notes (Signed)
Kevin Wiggins presents today for phlebotomy per MD orders. Phlebotomy procedure started at 1510 and ended at 1515. 500 grams removed. Patient observed for 30 minutes after procedure without any incident. Patient tolerated procedure well. IV needle removed intact.

## 2013-01-24 NOTE — Patient Instructions (Signed)
Therapeutic Phlebotomy Therapeutic phlebotomy is the controlled removal of blood from your body for the purpose of treating a medical condition. It is similar to donating blood. Usually, about a pint (470 mL) of blood is removed. The average adult has 9 to 12 pints (4.3 to 5.7 L) of blood. Therapeutic phlebotomy may be used to treat the following medical conditions:  Hemochromatosis. This is a condition in which there is too much iron in the blood.  Polycythemia vera. This is a condition in which there are too many red cells in the blood.  Porphyria cutanea tarda. This is a disease usually passed from one generation to the next (inherited). It is a condition in which an important part of hemoglobin is not made properly. This results in the build up of abnormal amounts of porphyrins in the body.  Sickle cell disease. This is an inherited disease. It is a condition in which the red blood cells form an abnormal crescent shape rather than a round shape. LET YOUR CAREGIVER KNOW ABOUT:  Allergies.  Medicines taken including herbs, eyedrops, over-the-counter medicines, and creams.  Use of steroids (by mouth or creams).  Previous problems with anesthetics or numbing medicine.  History of blood clots.  History of bleeding or blood problems.  Previous surgery.  Possibility of pregnancy, if this applies. RISKS AND COMPLICATIONS This is a simple and safe procedure. Problems are unlikely. However, problems can occur and may include:  Nausea or lightheadedness.  Low blood pressure.  Soreness, bleeding, swelling, or bruising at the needle insertion site.  Infection. BEFORE THE PROCEDURE  This is a procedure that can be done as an outpatient. Confirm the time that you need to arrive for your procedure. Confirm whether there is a need to fast or withhold any medications. It is helpful to wear clothing with sleeves that can be raised above the elbow. A blood sample may be done to determine the  amount of red blood cells or iron in your blood. Plan ahead of time to have someone drive you home after the procedure. PROCEDURE The entire procedure from preparation through recovery takes about 1 hour. The actual collection takes about 10 to 15 minutes.  A needle will be inserted into your vein.  Tubing and a collection bag will be attached to that needle.  Blood will flow through the needle and tubing into the collection bag.  You may be asked to open and close your hand slowly and continuously during the entire collection.  Once the specified amount of blood has been removed from your body, the collection bag and tubing will be clamped.  The needle will be removed.  Pressure will be held on the site of the needle insertion to stop the bleeding. Then a bandage will be placed over the needle insertion site. AFTER THE PROCEDURE  Your recovery will be assessed and monitored. If there are no problems, as an outpatient, you should be able to go home shortly after the procedure.  Document Released: 02/24/2011 Document Revised: 12/15/2011 Document Reviewed: 02/24/2011 [employer] Patient Information 2013 [employer].  

## 2013-01-25 NOTE — Progress Notes (Signed)
CC:   Lillia Carmel, M.D.  DIAGNOSES: 1. Hemochromatosis (H63D mutation, homozygous). 2. Hypogonadism secondary to hemochromatosis. 3. Erythrocytosis secondary to testosterone replacement therapy.  CURRENT THERAPY: 1. Phlebotomy to maintain hematocrit less than 45%. 2. Testosterone 200 mg IM q.2 weeks (the patient lives at home).  INTERIM HISTORY:  Mr. Strutz comes in for his followup.  He is not feeling all that well.  He said he is having more headaches.  This usually happens when his hemoglobin gets too high.  We have done a very good job with respect to the hemochromatosis aspect of his health care.  His ferritin really has not been an issue.  His ferritin back in March was 19.  He has not had testosterone replacement probably for about a month he says.  He has had no pruritus.  He has been under a lot of stress at work.  He is trying to get a job transfer.  He is also on Viagra.  He is having a tough time trying to get this for a reasonable price.  He is being a little bit more diligent with his blood pressure medicine.  PHYSICAL EXAMINATION:  General:  This is a well-developed, well- nourished white gentleman in no obvious distress.  Vital Signs: Temperature of 97, pulse 97, respiratory rate 20, blood pressure 152/88. Weight is 269.  Head and Neck:  Normocephalic, atraumatic skull.  There are no ocular or oral lesions.  There are no palpable cervical or supraclavicular lymph nodes.  Lungs:  Clear bilaterally.  Cardiac: Regular rate and rhythm with a normal S1, S2.  There are no murmurs, rubs, or bruits.  Abdomen:  Soft with good bowel sounds.  There is no palpable abdominal mass.  There is no fluid wave.  No palpable hepatosplenomegaly is noted.  Back:  No tenderness over the spine, ribs, or hips.  Extremities:  No clubbing, cyanosis, or edema.  Neurological: No focal neurological deficits.  Skin:  Some plethora with respect to his face and extremities.  LABORATORY  STUDIES:  White cell count is 8.1, hemoglobin 17.2, hematocrit 48.7, platelet count 246.  IMPRESSION:  Mr. Maret is a 52 year old gentleman with hemochromatosis. The real problem, however, is that he gets erythrocytosis secondary to testosterone.  We will go ahead and phlebotomize him today.  We will try to keep his hematocrit below 45 if we can.  I think he would feel better with this.  We will plan to get him back in another month or so.  I think we need to have some close followup with him right now so that we can monitor his hematocrit.    ______________________________ [name], M.D. PRE/MEDQ  D:  01/24/2013  T:  01/25/2013  Job:  [id number]

## 2013-02-23 ENCOUNTER — Ambulatory Visit (HOSPITAL_BASED_OUTPATIENT_CLINIC_OR_DEPARTMENT_OTHER): Payer: BC Managed Care – PPO | Admitting: Hematology & Oncology

## 2013-02-23 ENCOUNTER — Other Ambulatory Visit (HOSPITAL_BASED_OUTPATIENT_CLINIC_OR_DEPARTMENT_OTHER): Payer: BC Managed Care – PPO | Admitting: Lab

## 2013-02-23 ENCOUNTER — Ambulatory Visit (HOSPITAL_BASED_OUTPATIENT_CLINIC_OR_DEPARTMENT_OTHER): Payer: BC Managed Care – PPO

## 2013-02-23 DIAGNOSIS — D751 Secondary polycythemia: Secondary | ICD-10-CM

## 2013-02-23 DIAGNOSIS — E291 Testicular hypofunction: Secondary | ICD-10-CM

## 2013-02-23 LAB — CBC WITH DIFFERENTIAL (CANCER CENTER ONLY)
BASO#: 0 10*3/uL (ref 0.0–0.2)
Eosinophils Absolute: 0.4 10*3/uL (ref 0.0–0.5)
HCT: 48 % (ref 38.7–49.9)
HGB: 16.8 g/dL (ref 13.0–17.1)
LYMPH#: 1.9 10*3/uL (ref 0.9–3.3)
MCHC: 35 g/dL (ref 32.0–35.9)
MONO#: 1 10*3/uL — ABNORMAL HIGH (ref 0.1–0.9)
NEUT#: 5.3 10*3/uL (ref 1.5–6.5)
NEUT%: 61.4 % (ref 40.0–80.0)
RBC: 5.42 10*6/uL (ref 4.20–5.70)
WBC: 8.7 10*3/uL (ref 4.0–10.0)

## 2013-02-23 NOTE — Progress Notes (Signed)
This office note has been dictated.

## 2013-02-24 NOTE — Progress Notes (Signed)
CC:   Lillia Carmel, M.D.  DIAGNOSES: 1. Hemochromatosis (H63D mutation-homozygous). 2. Hypogonadism secondary to hemochromatosis. 3. Erythrocytosis secondary to testosterone replacement therapy.  INTERIM HISTORY:  Mr. Newey comes in for followup.  He is feeling fairly well.  He has had no specific complaints since we last saw him a month ago.  We are managing his iron quite nicely.  He gets phlebotomized more so because of erythrocytosis.  As such, his ferritin was not been an issue. His last ferritin was 54 back in April.  He has had no problems with the testosterone.  His testosterone level was 214.  He does his own injections.  He usually does this every 2 or 3 weeks.  He has not noted any problems with bleeding or bruising.  He has had no cough.  He has had no headache.  There have been no rashes.  He has had no leg swelling.  PHYSICAL EXAMINATION:  General:  This is a well-developed, well- nourished white gentleman in no obvious distress.  Vital signs: Temperature of 97.9, pulse 82, respiratory rate 18, blood pressure 148/88.  Weight is 276.  Head and neck:  Normocephalic, atraumatic skull.  There are no ocular or oral lesions.  There are no palpable cervical or supraclavicular lymph nodes.  Lungs:  Clear bilaterally. Cardiac:  Regular rate and rhythm with a normal S1 and S2.  There are no murmurs, rubs, or bruits.  Abdomen:  Soft with good bowel sounds.  There is no palpable abdominal mass.  There is no fluid wave.  No palpable hepatosplenomegaly is noted.  Back:  No tenderness of the spine, ribs, or hips.  Extremities:  No clubbing, cyanosis, or edema.  Neurological: No focal neurological deficits.  LABORATORY STUDIES:  White cell count 8.7, hemoglobin 16.8, hematocrit 48, platelet count 228.  IMPRESSION:  Mr. Mccree is a 52 year old white gentleman with hemochromatosis.  Again, he is homozygous for one of the minor mutations.  This is the H63D mutation.  Because we  are phlebotomizing him for his erythrocytosis, his ferritin is being kept down.  We will go ahead and plan to have him come back in another 6 weeks.  Again, we will phlebotomize him today.   ______________________________ [name], M.D. PRE/MEDQ  D:  02/23/2013  T:  02/24/2013  Job:  [id number]

## 2013-03-03 ENCOUNTER — Telehealth: Payer: Self-pay | Admitting: Hematology & Oncology

## 2013-03-03 NOTE — Telephone Encounter (Signed)
Pt aware of 7-2 and 6-18 cx

## 2013-03-23 ENCOUNTER — Other Ambulatory Visit: Payer: BC Managed Care – PPO | Admitting: Lab

## 2013-03-23 ENCOUNTER — Ambulatory Visit: Payer: BC Managed Care – PPO | Admitting: Hematology & Oncology

## 2013-04-06 ENCOUNTER — Ambulatory Visit (HOSPITAL_BASED_OUTPATIENT_CLINIC_OR_DEPARTMENT_OTHER): Payer: BC Managed Care – PPO

## 2013-04-06 ENCOUNTER — Other Ambulatory Visit (HOSPITAL_BASED_OUTPATIENT_CLINIC_OR_DEPARTMENT_OTHER): Payer: BC Managed Care – PPO | Admitting: Lab

## 2013-04-06 ENCOUNTER — Ambulatory Visit (HOSPITAL_BASED_OUTPATIENT_CLINIC_OR_DEPARTMENT_OTHER): Payer: BC Managed Care – PPO | Admitting: Hematology & Oncology

## 2013-04-06 DIAGNOSIS — E291 Testicular hypofunction: Secondary | ICD-10-CM

## 2013-04-06 LAB — CBC WITH DIFFERENTIAL (CANCER CENTER ONLY)
BASO%: 0.1 % (ref 0.0–2.0)
EOS%: 4.8 % (ref 0.0–7.0)
HCT: 47.8 % (ref 38.7–49.9)
LYMPH#: 1.7 10*3/uL (ref 0.9–3.3)
LYMPH%: 19.8 % (ref 14.0–48.0)
MCHC: 34.5 g/dL (ref 32.0–35.9)
NEUT%: 64.5 % (ref 40.0–80.0)
Platelets: 245 10*3/uL (ref 145–400)
RDW: 14.2 % (ref 11.1–15.7)

## 2013-04-06 LAB — TESTOSTERONE: Testosterone: 192 ng/dL — ABNORMAL LOW (ref 300–890)

## 2013-04-06 MED ORDER — SILDENAFIL CITRATE 100 MG PO TABS: 50.0000 mg | ORAL_TABLET | ORAL | Status: DC | PRN

## 2013-04-06 NOTE — Progress Notes (Signed)
Kevin Wiggins presents today for phlebotomy per MD orders. Phlebotomy procedure started at 1630 and ended at 1645. 500 ml removed. Patient observed for 30 minutes after procedure without any incident. Patient tolerated procedure well. IV needle removed intact.

## 2013-04-06 NOTE — Progress Notes (Signed)
This office note has been dictated.

## 2013-04-07 LAB — IRON AND TIBC CHCC: Iron: 222 ug/dL — ABNORMAL HIGH (ref 42–163)

## 2013-04-07 NOTE — Progress Notes (Signed)
CC:   Kevin Wiggins, M.D.  DIAGNOSES: 1. Hemochromatosis (H63D homozygous mutation). 2. Hypogonadism secondary to hemochromatosis. 3. Erythrocytosis secondary to testosterone replacement therapy.  CURRENT THERAPY: 1. Phlebotomy to maintain hematocrit below 48%. 2. Testosterone 200 mg IM q.2 weeks.  INTERIM HISTORY:  Kevin Wiggins comes in for his followup.  He is doing okay.  He does well with the testosterone every 2 weeks.  His last testosterone level was 214 back in April.  His hemochromatosis has not been a problem.  His last ferritin was 54. His iron saturation of 50%.  We phlebotomize him because of the erythrocytosis from the testosterone replacement therapy, which keeps his ferritin under good control.  He has had no fatigue or weakness.  He has had no issues blood pressure- wise.  He is being compliant with his Lotrel.  PHYSICAL EXAMINATION:  General:  This is a well-developed, well- nourished white gentleman in no obvious distress.  Vital Signs: Temperature of 98, pulse 80, respiratory rate 18, blood pressure 165/81. Weight is 273.  Head and Neck:  Normocephalic, atraumatic skull.  There are no ocular or oral lesions.  He has no facial plethora.  There is no conjunctival inflammation.  Neck:  Supple with no adenopathy.  Thyroid is not palpable.  Lungs:  Clear bilaterally.  Cardiac:  Regular rate and rhythm with a normal S1, S2.  There are no murmurs, rubs, or bruits. Abdomen:  Soft with good bowel sounds.  There is no palpable abdominal mass.  There is no palpable hepatosplenomegaly.  Extremities:  No clubbing, cyanosis, or edema.  Neurological:  No focal neurological deficits.  LABORATORY STUDIES:  White cell count is 8.7, hemoglobin 16.5, hematocrit 47.8, platelet count 245.  IMPRESSION:  Kevin Wiggins is a 52 year old gentleman with hemochromatosis. He is doing well with this.  We are monitoring his ferritin.  Again, with his phlebotomies, the ferritin has been  under good control.  We will go ahead and phlebotomize him today.  Even though he is below 48, I suspect that he likely will get above 48 quickly.  He just does well, particularly during the summertime, with a little bit lower hematocrit.  He will continue the testosterone replacement injections.  This does help his quality of life.  We will go ahead and plan to get him back in 2 more months.    ______________________________ Kevin Wiggins, M.D. PRE/MEDQ  D:  04/06/2013  T:  04/07/2013  Job:  [id number]

## 2013-04-18 ENCOUNTER — Other Ambulatory Visit: Payer: BC Managed Care – PPO | Admitting: Lab

## 2013-04-18 ENCOUNTER — Ambulatory Visit: Payer: BC Managed Care – PPO | Admitting: Hematology & Oncology

## 2013-05-19 ENCOUNTER — Telehealth: Payer: Self-pay | Admitting: Hematology & Oncology

## 2013-05-19 NOTE — Telephone Encounter (Signed)
Pt called to check when next appointment was. He is aware of 8-18 he said he didn't feel good. I let RN know

## 2013-05-23 ENCOUNTER — Ambulatory Visit (HOSPITAL_BASED_OUTPATIENT_CLINIC_OR_DEPARTMENT_OTHER): Payer: BC Managed Care – PPO | Admitting: Hematology & Oncology

## 2013-05-23 ENCOUNTER — Ambulatory Visit (HOSPITAL_BASED_OUTPATIENT_CLINIC_OR_DEPARTMENT_OTHER): Payer: BC Managed Care – PPO

## 2013-05-23 ENCOUNTER — Ambulatory Visit (HOSPITAL_BASED_OUTPATIENT_CLINIC_OR_DEPARTMENT_OTHER): Payer: BC Managed Care – PPO | Admitting: Lab

## 2013-05-23 VITALS — BP 177/99 | HR 93 | Temp 98.1°F | Resp 18 | Ht 69.0 in | Wt 271.0 lb

## 2013-05-23 DIAGNOSIS — D751 Secondary polycythemia: Secondary | ICD-10-CM

## 2013-05-23 DIAGNOSIS — E291 Testicular hypofunction: Secondary | ICD-10-CM

## 2013-05-23 LAB — CBC WITH DIFFERENTIAL (CANCER CENTER ONLY)
BASO#: 0 10*3/uL (ref 0.0–0.2)
BASO%: 0.2 % (ref 0.0–2.0)
EOS%: 3.5 % (ref 0.0–7.0)
HCT: 49.2 % (ref 38.7–49.9)
LYMPH#: 1.7 10*3/uL (ref 0.9–3.3)
LYMPH%: 20.9 % (ref 14.0–48.0)
MCH: 30 pg (ref 28.0–33.4)
MCHC: 34.3 g/dL (ref 32.0–35.9)
MCV: 87 fL (ref 82–98)
MONO%: 8.1 % (ref 0.0–13.0)
NEUT%: 67.3 % (ref 40.0–80.0)
RDW: 13.8 % (ref 11.1–15.7)

## 2013-05-23 MED ORDER — TESTOSTERONE CYPIONATE 200 MG/ML IM SOLN: 200.0000 mg | INTRAMUSCULAR | Status: DC

## 2013-05-23 NOTE — Progress Notes (Signed)
This office note has been dictated.

## 2013-05-23 NOTE — Progress Notes (Signed)
Kevin Wiggins presents today for phlebotomy per MD orders. Phlebotomy procedure started at 1550 and ended at 1555 500 grams removed. Patient observed for 30 minutes after procedure without any incident. Patient tolerated procedure well. IV needle removed intact.

## 2013-05-24 LAB — TESTOSTERONE: Testosterone: 236 ng/dL — ABNORMAL LOW (ref 300–890)

## 2013-05-24 NOTE — Progress Notes (Signed)
CC:   Kevin Wiggins, M.D.  DIAGNOSES: 1. Hemochromatosis (H63D mutation, homozygous). 2. Hypogonadism secondary to hemochromatosis. 3. Erythrocytosis secondary to testosterone replacement therapy.  CURRENT THERAPY: 1. Phlebotomy to maintain hematocrit below 48%. 2. Testosterone 200 mg IM q.2 weeks.  INTERIM HISTORY:  Kevin Wiggins comes in for his followup.  He is doing fairly well.  He has had no specific complaints since we last saw him. He has had a little bit of a headache.  He is not all that compliant with his blood pressure medications.  He is doing testosterone every 2 weeks.  This makes him feel better.  He is able to work.  When we last saw him back in July, his iron studies showed a ferritin of 19%.  Iron saturation was 65%.  His testosterone was 192.  PHYSICAL EXAM:  General:  This is a well-developed, well-nourished white gentleman in no obvious distress.  Vital signs:  Temperature 98.1, pulse 93, respiratory rate 18, blood pressure 177/99.  Weight is 271.  Head and neck:  Normocephalic, atraumatic skull.  There are no ocular or oral lesions.  There are no palpable cervical or supraclavicular lymph nodes. Lungs:  Clear bilaterally.  Cardiac:  Regular rate and rhythm with a normal S1, S2.  There are no murmurs, rubs or bruits.  Abdomen:  Soft. He has good bowel sounds.  There is no fluid wave.  There is no palpable hepatosplenomegaly.  Extremities:  Show no clubbing, cyanosis or edema. Neurological:  Shows no focal neurological deficits.  LABORATORY STUDIES:  White cell count 8.2, hemoglobin 16.9, hematocrit 49.2, platelet count 235.  IMPRESSION:  Kevin Wiggins is a 52 year old gentleman with hemochromatosis. His main issue is the erythrocytosis from the testosterone replacement therapy.  We will go ahead and phlebotomize him today.  We will plan to get him back to see [place] in another 6 weeks.  This typically is a good time frame for him to be  phlebotomized.    ______________________________ Kevin Wiggins, M.D. PRE/MEDQ  D:  05/23/2013  T:  05/24/2013  Job:  [id number]

## 2013-05-25 ENCOUNTER — Telehealth: Payer: Self-pay | Admitting: Hematology & Oncology

## 2013-05-25 NOTE — Telephone Encounter (Signed)
Pt aware of 07-06-13 appointment °

## 2013-06-08 ENCOUNTER — Ambulatory Visit: Payer: BC Managed Care – PPO | Admitting: Hematology & Oncology

## 2013-06-08 ENCOUNTER — Other Ambulatory Visit: Payer: BC Managed Care – PPO | Admitting: Lab

## 2013-07-04 NOTE — Progress Notes (Signed)
This encounter was created in error - please disregard.

## 2013-07-06 ENCOUNTER — Other Ambulatory Visit: Payer: BC Managed Care – PPO | Admitting: Lab

## 2013-07-06 ENCOUNTER — Ambulatory Visit: Payer: BC Managed Care – PPO | Admitting: Hematology & Oncology

## 2013-07-22 ENCOUNTER — Emergency Department (HOSPITAL_COMMUNITY): Payer: BC Managed Care – PPO

## 2013-07-22 ENCOUNTER — Inpatient Hospital Stay (HOSPITAL_COMMUNITY): Payer: BC Managed Care – PPO

## 2013-07-22 ENCOUNTER — Encounter (HOSPITAL_COMMUNITY): Payer: Self-pay | Admitting: Emergency Medicine

## 2013-07-22 ENCOUNTER — Inpatient Hospital Stay (HOSPITAL_COMMUNITY)
Admission: EM | Admit: 2013-07-22 | Discharge: 2013-07-26 | DRG: 287 | Disposition: A | Discharge status: Home / Self Care | Payer: BC Managed Care – PPO | Attending: Cardiology | Admitting: Cardiology

## 2013-07-22 DIAGNOSIS — I509 Heart failure, unspecified: Secondary | ICD-10-CM | POA: Diagnosis present

## 2013-07-22 DIAGNOSIS — I10 Essential (primary) hypertension: Secondary | ICD-10-CM

## 2013-07-22 DIAGNOSIS — N182 Chronic kidney disease, stage 2 (mild): Secondary | ICD-10-CM | POA: Diagnosis present

## 2013-07-22 DIAGNOSIS — Q210 Ventricular septal defect: Secondary | ICD-10-CM

## 2013-07-22 DIAGNOSIS — I452 Bifascicular block: Secondary | ICD-10-CM | POA: Diagnosis present

## 2013-07-22 DIAGNOSIS — I5033 Acute on chronic diastolic (congestive) heart failure: Principal | ICD-10-CM | POA: Insufficient documentation

## 2013-07-22 DIAGNOSIS — I517 Cardiomegaly: Secondary | ICD-10-CM

## 2013-07-22 DIAGNOSIS — I129 Hypertensive chronic kidney disease with stage 1 through stage 4 chronic kidney disease, or unspecified chronic kidney disease: Secondary | ICD-10-CM | POA: Diagnosis present

## 2013-07-22 DIAGNOSIS — I2510 Atherosclerotic heart disease of native coronary artery without angina pectoris: Secondary | ICD-10-CM | POA: Diagnosis present

## 2013-07-22 DIAGNOSIS — I2789 Other specified pulmonary heart diseases: Secondary | ICD-10-CM | POA: Diagnosis present

## 2013-07-22 HISTORY — DX: Unspecified diastolic (congestive) heart failure: I50.30

## 2013-07-22 HISTORY — DX: Cardiac murmur, unspecified: R01.1

## 2013-07-22 HISTORY — DX: Left anterior fascicular block: I44.4

## 2013-07-22 HISTORY — DX: Unspecified right bundle-branch block: I45.10

## 2013-07-22 HISTORY — DX: Atherosclerotic heart disease of native coronary artery without angina pectoris: I25.10

## 2013-07-22 HISTORY — DX: Procedure and treatment not carried out because of patient's decision for reasons of belief and group pressure: Z53.1

## 2013-07-22 HISTORY — DX: Reserved for inherently not codable concepts without codable children: IMO0001

## 2013-07-22 LAB — COMPREHENSIVE METABOLIC PANEL
ALT: 49 U/L (ref 0–53)
AST: 40 U/L — ABNORMAL HIGH (ref 0–37)
Albumin: 3.3 g/dL — ABNORMAL LOW (ref 3.5–5.2)
Calcium: 8.9 mg/dL (ref 8.4–10.5)
GFR calc Af Amer: 57 mL/min — ABNORMAL LOW (ref >90)
Sodium: 138 mEq/L (ref 135–145)
Total Protein: 6.7 g/dL (ref 6.0–8.3)

## 2013-07-22 LAB — CBC WITH DIFFERENTIAL/PLATELET
Basophils Absolute: 0 10*3/uL (ref 0.0–0.1)
Basophils Relative: 0 % (ref 0–1)
Eosinophils Absolute: 0.3 10*3/uL (ref 0.0–0.7)
Eosinophils Relative: 3 % (ref 0–5)
HCT: 45.8 % (ref 39.0–52.0)
Hemoglobin: 16 g/dL (ref 13.0–17.0)
MCH: 30.3 pg (ref 26.0–34.0)
MCHC: 34.9 g/dL (ref 30.0–36.0)
Monocytes Absolute: 0.8 10*3/uL (ref 0.1–1.0)
Monocytes Relative: 8 % (ref 3–12)
Neutro Abs: 7.2 10*3/uL (ref 1.7–7.7)
RDW: 15.5 % (ref 11.5–15.5)

## 2013-07-22 LAB — PROTIME-INR
INR: 1.04 (ref 0.00–1.49)
Prothrombin Time: 13.4 seconds (ref 11.6–15.2)

## 2013-07-22 LAB — CBC
HCT: 47.2 % (ref 39.0–52.0)
Hemoglobin: 16.6 g/dL (ref 13.0–17.0)
MCH: 30.6 pg (ref 26.0–34.0)
MCV: 87.1 fL (ref 78.0–100.0)
Platelets: 250 10*3/uL (ref 150–400)
RBC: 5.42 MIL/uL (ref 4.22–5.81)
WBC: 11.8 10*3/uL — ABNORMAL HIGH (ref 4.0–10.5)

## 2013-07-22 LAB — BASIC METABOLIC PANEL
CO2: 23 mEq/L (ref 19–32)
Calcium: 9.2 mg/dL (ref 8.4–10.5)
Creatinine, Ser: 1.5 mg/dL — ABNORMAL HIGH (ref 0.50–1.35)
GFR calc Af Amer: 60 mL/min — ABNORMAL LOW (ref >90)
Sodium: 138 mEq/L (ref 135–145)

## 2013-07-22 LAB — HEMOGLOBIN A1C
Hgb A1c MFr Bld: 6.6 % — ABNORMAL HIGH (ref <5.7)
Mean Plasma Glucose: 143 mg/dL — ABNORMAL HIGH (ref <117)

## 2013-07-22 LAB — TSH: TSH: 0.659 u[IU]/mL (ref 0.350–4.500)

## 2013-07-22 LAB — LIPID PANEL
Total CHOL/HDL Ratio: 5 RATIO
Triglycerides: 109 mg/dL (ref <150)

## 2013-07-22 LAB — CREATININE, SERUM: GFR calc Af Amer: 61 mL/min — ABNORMAL LOW (ref >90)

## 2013-07-22 LAB — TROPONIN I: Troponin I: <0.3 ng/mL (ref <0.30)

## 2013-07-22 LAB — MAGNESIUM: Magnesium: 1.7 mg/dL (ref 1.5–2.5)

## 2013-07-22 MED ORDER — DIAZEPAM 5 MG PO TABS
2.5000 mg | ORAL_TABLET | Freq: Once | ORAL | Status: DC
  Filled 2013-07-22: qty 1

## 2013-07-22 MED ORDER — FUROSEMIDE 10 MG/ML IJ SOLN
40.0000 mg | Freq: Once | INTRAMUSCULAR | Status: AC
Start: 2013-07-22 — End: 2013-07-22
  Administered 2013-07-22: 40 mg via INTRAVENOUS
  Filled 2013-07-22: qty 4

## 2013-07-22 MED ORDER — SODIUM CHLORIDE 0.9 % IJ SOLN: 3.0000 mL | INTRAMUSCULAR | Status: DC | PRN

## 2013-07-22 MED ORDER — HEPARIN SODIUM (PORCINE) 5000 UNIT/ML IJ SOLN
5000.0000 [IU] | Freq: Three times a day (TID) | INTRAMUSCULAR | Status: DC
  Administered 2013-07-22 – 2013-07-25 (×10): 5000 [IU] via SUBCUTANEOUS
  Filled 2013-07-22 (×17): qty 1

## 2013-07-22 MED ORDER — ASPIRIN EC 81 MG PO TBEC
81.0000 mg | DELAYED_RELEASE_TABLET | Freq: Every day | ORAL | Status: DC
  Administered 2013-07-22 – 2013-07-26 (×5): 81 mg via ORAL
  Filled 2013-07-22 (×5): qty 1

## 2013-07-22 MED ORDER — SODIUM CHLORIDE 0.9 % IJ SOLN
3.0000 mL | Freq: Two times a day (BID) | INTRAMUSCULAR | Status: DC
  Administered 2013-07-22 – 2013-07-25 (×7): 3 mL via INTRAVENOUS

## 2013-07-22 MED ORDER — SODIUM CHLORIDE 0.9 % IV SOLN
INTRAVENOUS | Status: DC
  Administered 2013-07-22: 20 mL/h via INTRAVENOUS

## 2013-07-22 MED ORDER — PANTOPRAZOLE SODIUM 40 MG PO TBEC
40.0000 mg | DELAYED_RELEASE_TABLET | Freq: Every day | ORAL | Status: DC
  Administered 2013-07-22 – 2013-07-26 (×5): 40 mg via ORAL
  Filled 2013-07-22 (×4): qty 1

## 2013-07-22 MED ORDER — SODIUM CHLORIDE 0.9 % IV SOLN
250.0000 mL | INTRAVENOUS | Status: DC | PRN
  Administered 2013-07-23 – 2013-07-24 (×2): 250 mL via INTRAVENOUS

## 2013-07-22 MED ORDER — FUROSEMIDE 10 MG/ML IJ SOLN
40.0000 mg | Freq: Two times a day (BID) | INTRAMUSCULAR | Status: DC
  Administered 2013-07-22 – 2013-07-25 (×8): 40 mg via INTRAVENOUS
  Filled 2013-07-22 (×11): qty 4

## 2013-07-22 NOTE — ED Notes (Signed)
Pt with shortness of breath and chest tightness for the the past 2 weeks that has increasingly gotten worse.  Pt denies fevers or chills.  Sts he has a cough but not coughing anything up.  Denies abdominal pain but reports some diarrhea earlier today; denies n/v.

## 2013-07-22 NOTE — H&P (Signed)
Kevin Wiggins is an 52 y.o. male.   Chief Complaint: shortness of breath HPI: Kevin Wiggins is a 52 yo man with PMH of hypertension and hemochromotosis who presents with 2 weeks of worsening shortness of breath. He also has some chest pain and progressive orthopnea. Nocturnal cough. No fever/chills/nausea/vomiting diarrhea. He was given 40 mg IV lasix in the ER.  He tells me he thought he had a cold. He's been coughing for upwards of two weeks. Father with CHF. Hemochromotosis runs in his family. He's been getting blood removal for years. He has some orthopnea and especially nocturnal cough. Some exercise limitation. Chest tightness at rest for several days as well. No syncope/presyncope.    History reviewed. No pertinent past medical history.  History reviewed. No pertinent past surgical history.  Family History  Problem Relation Age of Onset  . Cancer Mother   . Hypertension Father    Social History:  reports that he has never smoked. He does not have any smokeless tobacco history on file. He reports that he does not drink alcohol. His drug history is not on file.  Allergies:  Allergies  Allergen Reactions  . Watermelon Concentrate Swelling     (Not in a hospital admission)  Results for orders placed during the hospital encounter of 07/22/13 (from the past 48 hour(s))  CBC WITH DIFFERENTIAL     Status: Abnormal   Collection Time    07/22/13  3:13 AM      Result Value Range   WBC 10.6 (*) 4.0 - 10.5 K/uL   RBC 5.28  4.22 - 5.81 MIL/uL   Hemoglobin 16.0  13.0 - 17.0 g/dL   HCT 09.8  11.9 - 14.7 %   MCV 86.7  78.0 - 100.0 fL   MCH 30.3  26.0 - 34.0 pg   MCHC 34.9  30.0 - 36.0 g/dL   RDW 82.9  56.2 - 13.0 %   Platelets 250  150 - 400 K/uL   Neutrophils Relative % 68  43 - 77 %   Neutro Abs 7.2  1.7 - 7.7 K/uL   Lymphocytes Relative 21  12 - 46 %   Lymphs Abs 2.3  0.7 - 4.0 K/uL   Monocytes Relative 8  3 - 12 %   Monocytes Absolute 0.8  0.1 - 1.0 K/uL   Eosinophils Relative 3  0 -  5 %   Eosinophils Absolute 0.3  0.0 - 0.7 K/uL   Basophils Relative 0  0 - 1 %   Basophils Absolute 0.0  0.0 - 0.1 K/uL  COMPREHENSIVE METABOLIC PANEL     Status: Abnormal   Collection Time    07/22/13  3:13 AM      Result Value Range   Sodium 138  135 - 145 mEq/L   Potassium 4.5  3.5 - 5.1 mEq/L   Comment: HEMOLYSIS AT THIS LEVEL MAY AFFECT RESULT   Chloride 101  96 - 112 mEq/L   CO2 24  19 - 32 mEq/L   Glucose, Bld 141 (*) 70 - 99 mg/dL   BUN 17  6 - 23 mg/dL   Creatinine, Ser 8.65 (*) 0.50 - 1.35 mg/dL   Calcium 8.9  8.4 - 78.4 mg/dL   Total Protein 6.7  6.0 - 8.3 g/dL   Albumin 3.3 (*) 3.5 - 5.2 g/dL   AST 40 (*) 0 - 37 U/L   ALT 49  0 - 53 U/L   Alkaline Phosphatase 63  39 - 117 U/L  Total Bilirubin 0.6  0.3 - 1.2 mg/dL   GFR calc non Af Amer 49 (*) >90 mL/min   GFR calc Af Amer 57 (*) >90 mL/min   Comment: (NOTE)     The eGFR has been calculated using the CKD EPI equation.     This calculation has not been validated in all clinical situations.     eGFR's persistently <90 mL/min signify possible Chronic Kidney     Disease.  PRO B NATRIURETIC PEPTIDE     Status: Abnormal   Collection Time    07/22/13  3:13 AM      Result Value Range   Pro B Natriuretic peptide (BNP) 1077.0 (*) 0 - 125 pg/mL  TROPONIN I     Status: None   Collection Time    07/22/13  3:13 AM      Result Value Range   Troponin I <0.30  <0.30 ng/mL   Comment:            Due to the release kinetics of cTnI,     a negative result within the first hours     of the onset of symptoms does not rule out     myocardial infarction with certainty.     If myocardial infarction is still suspected,     repeat the test at appropriate intervals.   Dg Chest 2 View  07/22/2013   CLINICAL DATA:  Chest pain  EXAM: CHEST  2 VIEW  COMPARISON:  None.  FINDINGS: Normal heart size. Upper left mediastinal convexity is likely from ectatic vasculature. Diffuse interstitial prominence with Kerley B-lines. No significant  effusion. No asymmetric opacity. No acute osseous findings.  IMPRESSION: Interstitial pulmonary edema.   Electronically Signed   By: Tiburcio Pea M.D.   On: 07/22/2013 03:34    Review of Systems  Constitutional: Positive for malaise/fatigue. Negative for fever and chills.  HENT: Negative for ear pain and hearing loss.   Eyes: Negative for blurred vision, photophobia and pain.  Respiratory: Positive for cough and shortness of breath. Negative for hemoptysis and sputum production.   Cardiovascular: Positive for chest pain and orthopnea.  Gastrointestinal: Negative for heartburn, nausea, vomiting, diarrhea and constipation.  Genitourinary: Negative for dysuria, urgency and frequency.  Musculoskeletal: Negative for back pain, myalgias and neck pain.  Skin: Negative for itching and rash.  Neurological: Negative for dizziness, tingling, tremors and headaches.  Endo/Heme/Allergies: Negative for environmental allergies. Does not bruise/bleed easily.  Psychiatric/Behavioral: Negative for depression, suicidal ideas and substance abuse.    Blood pressure 153/66, pulse 107, resp. rate 23, SpO2 94.00%. Physical Exam  Nursing note and vitals reviewed. Constitutional: He is oriented to person, place, and time. He appears well-developed and well-nourished. No distress.  HENT:  Head: Normocephalic and atraumatic.  Nose: Nose normal.  Mouth/Throat: Oropharynx is clear and moist. No oropharyngeal exudate.  Eyes: Conjunctivae and EOM are normal. Pupils are equal, round, and reactive to light.  Neck: Normal range of motion. Neck supple. JVD present. No thyromegaly present.  Cardiovascular: Regular rhythm and intact distal pulses.  Exam reveals gallop.   No murmur heard. Tachycardic   Respiratory: Effort normal. No respiratory distress. He has no wheezes. He has rales.  GI: Soft. Bowel sounds are normal. He exhibits no distension. There is no tenderness.  Musculoskeletal: Normal range of motion. He  exhibits no edema.  Neurological: He is alert and oriented to person, place, and time. No cranial nerve deficit.  Skin: Skin is warm and dry. He is  not diaphoretic. No erythema.  Psychiatric: He has a normal mood and affect. His behavior is normal. Thought content normal.    Labs reviewed; wbc 10.6, h/h 16/45.8, na 138, K 4.5, bun/cr 17/1.56, ast/alt 40/49, Trop <0.3, BNP 1077 Chest x-ray pulmonary edema ECG: RBBB, Sinus tachycardia, inferior infarct (old), LAD  Problem List Acute new onset heart failure Shortness of breath ? Chronic kidney disease Hypertension Pulmonary Edema Mildly elevated ast/alt Hemochromotosis  Assessment/Plan 52 yo man with PMH of hypertension and hemochromotosis who presents with shortness of breath, orthopnea and cough and found to have elevated BNP. Differential diagnosis from heart failure is hemochromotosis, long-standing hypertension, coronary artery disease or mixed etiologies among others. I favor heart failure related to hemochromatosis and potentially hypertension. ? Diastolic.  - admit to telemetry/heart failure - watch ins/outs, lasix 40mg  bid written for; next dose 11:00 - PM BMP  - trend cardiac markers - regular diet for now - tsh, hba1c, lipid panel, mg - echocardiogram this AM  - renal US Kevin Wiggins 07/22/2013, 5:29 AM

## 2013-07-22 NOTE — Progress Notes (Addendum)
Patient ID: Kevin Wiggins, male   DOB: 04-18-1961, 52 y.o.   MRN: [id number]  Patient seen and assessed this morning.  See Dr. Landry Dyke H&P from earlier today.    Kevin Wiggins is volume overloaded with elevated JVD, has been short of breath for 2 weeks with exertion, no chest pain.  Of note, Kevin Wiggins has a 3/6 HSM LLSB and apex.    Kevin Wiggins needs an echo today.  High on the differential would be cardiomyopathy related to hemochromatosis given his history of homozygous HH.  It is possible that Kevin Wiggins has developed a dilated cardiomyopathy with significant TR or MR causing murmur.  Also possible that Kevin Wiggins has primary valvular disease.  I will also arrange for cardiac MRI to assess T2*.  Kevin Wiggins does have RBBB/LAFB, and conduction system disease is common with cardiac involvement of hemochromatosis.   Continue diuresis today.  Need to follow renal function carefully.   Marca Ancona 07/22/2013 9:10 AM  Patient unable to tolerate cardiac MRI due to claustrophobia.  Echo showed normal LV with dilated RV and probable VSD. The VSD appears small, probably not large enough to cause RV dilation.  However, should have workup with TEE and probably cardiac cath to look for pulmonary HTN as cause of dilated RV and also to assess for significant shunt. This can be done Monday or as an outpatient.   Marca Ancona 07/22/2013 4:14 PM

## 2013-07-22 NOTE — Progress Notes (Signed)
Utilization Review Completed Chenay Nesmith J. Rutger Salton, RN, BSN, NCM [phone]  

## 2013-07-22 NOTE — ED Provider Notes (Signed)
CSN: [id number]     Arrival date & time 07/22/13  0245 History   First MD Initiated Contact with Patient 07/22/13 0250     Chief Complaint  Patient presents with  . Shortness of Breath   (Consider location/radiation/quality/duration/timing/severity/associated sxs/prior Treatment) Patient is a 52 y.o. male presenting with shortness of breath. The history is provided by the patient.  Shortness of Breath  is in here with 2 weeks of dyspnea on exertion and orthopnea. Has also noted intermittent chest tightness that has been nonexertional. Cough has been nonproductive and without fever. No nausea vomiting. No history of same. Denies any coronary artery disease history. No history of CHF. Symptoms began gradually worse and are typically worse at night. Patient denies syncope or near-syncope.  History reviewed. No pertinent past medical history. History reviewed. No pertinent past surgical history. Family History  Problem Relation Age of Onset  . Cancer Mother   . Hypertension Father    History  Substance Use Topics  . Smoking status: Never Smoker   . Smokeless tobacco: Not on file  . Alcohol Use: No     Comment: socially    Review of Systems  Respiratory: Positive for shortness of breath.   All other systems reviewed and are negative.    Allergies  Watermelon concentrate  Home Medications   Current Outpatient Rx  Name  Route  Sig  Dispense  Refill  . amLODipine-benazepril (LOTREL) 10-20 MG per capsule   Oral   Take 1 capsule by mouth daily.   30 capsule   6   . sildenafil (VIAGRA) 100 MG tablet   Oral   Take 0.5 tablets (50 mg total) by mouth as needed.   10 tablet   5   . testosterone cypionate (DEPOTESTOTERONE CYPIONATE) 200 MG/ML injection   Intramuscular   Inject 1 mL (200 mg total) into the muscle every 14 (fourteen) days.   20 mL   4    There were no vitals taken for this visit. Physical Exam  Nursing note and vitals reviewed. Constitutional: He is  oriented to person, place, and time. He appears well-developed and well-nourished.  Non-toxic appearance. No distress.  HENT:  Head: Normocephalic and atraumatic.  Eyes: Conjunctivae, EOM and lids are normal. Pupils are equal, round, and reactive to light.  Neck: Normal range of motion. Neck supple. No tracheal deviation present. No mass present.  Cardiovascular: Regular rhythm and normal heart sounds.  Tachycardia present.  Exam reveals no gallop.   No murmur heard. Pulmonary/Chest: Effort normal. No stridor. No respiratory distress. He has decreased breath sounds. He has no wheezes. He has rhonchi. He has no rales.  Abdominal: Soft. Normal appearance and bowel sounds are normal. He exhibits no distension. There is no tenderness. There is no rebound and no CVA tenderness.  Musculoskeletal: Normal range of motion. He exhibits no edema and no tenderness.  Neurological: He is alert and oriented to person, place, and time. He has normal strength. No cranial nerve deficit or sensory deficit. GCS eye subscore is 4. GCS verbal subscore is 5. GCS motor subscore is 6.  Skin: Skin is warm and dry. No abrasion and no rash noted.  Psychiatric: He has a normal mood and affect. His speech is normal and behavior is normal.    ED Course  Procedures (including critical care time) Labs Review Labs Reviewed - No data to display Imaging Review No results found.  EKG Interpretation     Ventricular Rate:  109 PR Interval:  QRS Duration:   QT Interval:    QTC Calculation:   R Axis:     Text Interpretation:              MDM  No diagnosis found.   Patient given Lasix 40 mg IV push for his new onset CHF. Spoke with cardiology and he will come see the patient  Toy Baker, MD 07/22/13 [phone]

## 2013-07-22 NOTE — Progress Notes (Signed)
Patient c/o chest pressure, denies pain. Will continue to monitor. Mickey Farber , RN

## 2013-07-22 NOTE — ED Notes (Signed)
MD at bedside. 

## 2013-07-22 NOTE — Progress Notes (Addendum)
Echocardiogram 2D Echocardiogram has been performed.  07/22/2013 10:16 AM Gertie Fey, RVT, RDCS, RDMS

## 2013-07-23 DIAGNOSIS — Q210 Ventricular septal defect: Secondary | ICD-10-CM

## 2013-07-23 DIAGNOSIS — I517 Cardiomegaly: Secondary | ICD-10-CM

## 2013-07-23 DIAGNOSIS — I10 Essential (primary) hypertension: Secondary | ICD-10-CM

## 2013-07-23 DIAGNOSIS — I5033 Acute on chronic diastolic (congestive) heart failure: Principal | ICD-10-CM

## 2013-07-23 LAB — BASIC METABOLIC PANEL
BUN: 18 mg/dL (ref 6–23)
CO2: 27 mEq/L (ref 19–32)
Chloride: 100 mEq/L (ref 96–112)
Creatinine, Ser: 1.59 mg/dL — ABNORMAL HIGH (ref 0.50–1.35)
GFR calc non Af Amer: 48 mL/min — ABNORMAL LOW (ref >90)
Glucose, Bld: 108 mg/dL — ABNORMAL HIGH (ref 70–99)
Potassium: 3.6 mEq/L (ref 3.5–5.1)
Sodium: 140 mEq/L (ref 135–145)

## 2013-07-23 MED ORDER — HYDROCORTISONE 1 % EX CREA
1.0000 "application " | TOPICAL_CREAM | Freq: Three times a day (TID) | CUTANEOUS | Status: DC | PRN
  Filled 2013-07-23: qty 28

## 2013-07-23 MED ORDER — ACETAMINOPHEN 325 MG PO TABS
650.0000 mg | ORAL_TABLET | Freq: Four times a day (QID) | ORAL | Status: DC | PRN
  Administered 2013-07-23 – 2013-07-26 (×2): 650 mg via ORAL
  Filled 2013-07-23 (×2): qty 2

## 2013-07-23 MED ORDER — ZOLPIDEM TARTRATE 5 MG PO TABS: 5.0000 mg | ORAL_TABLET | Freq: Every evening | ORAL | Status: DC | PRN

## 2013-07-23 MED ORDER — GUAIFENESIN-DM 100-10 MG/5ML PO SYRP: 15.0000 mL | ORAL_SOLUTION | ORAL | Status: DC | PRN

## 2013-07-23 MED ORDER — ALUM & MAG HYDROXIDE-SIMETH 200-200-20 MG/5ML PO SUSP: 30.0000 mL | ORAL | Status: DC | PRN

## 2013-07-23 MED ORDER — PRAMOXINE-ZINC OXIDE IN MO 1-12.5 % RE OINT
1.0000 "application " | TOPICAL_OINTMENT | Freq: Three times a day (TID) | RECTAL | Status: DC | PRN
  Filled 2013-07-23: qty 28.3

## 2013-07-23 MED ORDER — LOPERAMIDE HCL 2 MG PO CAPS: 2.0000 mg | ORAL_CAPSULE | ORAL | Status: DC | PRN

## 2013-07-23 MED ORDER — MAGNESIUM HYDROXIDE 400 MG/5ML PO SUSP: 30.0000 mL | Freq: Every day | ORAL | Status: DC | PRN

## 2013-07-23 MED ORDER — CARVEDILOL 3.125 MG PO TABS
3.1250 mg | ORAL_TABLET | Freq: Two times a day (BID) | ORAL | Status: DC
  Administered 2013-07-23 – 2013-07-26 (×5): 3.125 mg via ORAL
  Filled 2013-07-23 (×9): qty 1

## 2013-07-23 MED ORDER — SODIUM CHLORIDE 0.9 % IV SOLN
INTRAVENOUS | Status: DC
  Administered 2013-07-24: via INTRAVENOUS

## 2013-07-23 NOTE — Progress Notes (Signed)
Primary cardiologist: Dr. Marca Ancona  Subjective:   Intermittent dry cough, states that breathing is somewhat better however. No chest pain.   Objective:   Temp:  [98.1 F (36.7 C)-98.2 F (36.8 C)] 98.2 F (36.8 C) (10/18 0914) Pulse Rate:  [100-105] 105 (10/18 0914) Resp:  [18-20] 20 (10/18 0459) BP: (130-145)/(60-68) 145/62 mmHg (10/18 0914) SpO2:  [94 %-96 %] 95 % (10/18 0914) Weight:  [259 lb 11.2 oz (117.8 kg)] 259 lb 11.2 oz (117.8 kg) (10/18 0459) Last BM Date: 07/21/13  Filed Weights   07/22/13 0709 07/23/13 0459  Weight: 264 lb 1.6 oz (119.795 kg) 259 lb 11.2 oz (117.8 kg)    Intake/Output Summary (Last 24 hours) at 07/23/13 0934 Last data filed at 07/23/13 0834  Gross per 24 hour  Intake   1446 ml  Output   4075 ml  Net  -2629 ml    Telemetry: Sinus rhythm.  Exam:  General: Overweight, no distress.  Lungs: Decreased breath sounds.  Cardiac: RRR, 2/6 systolic murmur left apical region, S4.  Abdomen: Protuberant, nontender.  Extremities: Trace edema.  Lab Results:  Basic Metabolic Panel:  Recent Labs Lab 07/22/13 0313 07/22/13 0750 07/22/13 1646 07/23/13 0530  NA 138  --  138 140  K 4.5  --  3.7 3.6  CL 101  --  98 100  CO2 24  --  23 27  GLUCOSE 141*  --  203* 108*  BUN 17  --  16 18  CREATININE 1.56* 1.49* 1.50* 1.59*  CALCIUM 8.9  --  9.2 9.0  MG  --  1.7  --   --     Liver Function Tests:  Recent Labs Lab 07/22/13 0313  AST 40*  ALT 49  ALKPHOS 63  BILITOT 0.6  PROT 6.7  ALBUMIN 3.3*    CBC:  Recent Labs Lab 07/22/13 0313 07/22/13 0750  WBC 10.6* 11.8*  HGB 16.0 16.6  HCT 45.8 47.2  MCV 86.7 87.1  PLT 250 250    Cardiac Enzymes:  Recent Labs Lab 07/22/13 0750 07/22/13 1300 07/22/13 1756  TROPONINI <0.30 <0.30 <0.30    BNP:  Recent Labs  07/22/13 0313 07/23/13 0530  PROBNP 1077.0* 943.5*    Coagulation:  Recent Labs Lab 07/22/13 0750  INR 1.04    Echocardiogram: Study  Conclusions  - Left ventricle: The cavity size was normal. Wall thickness was normal. Systolic function was normal. The estimated ejection fraction was in the range of 60% to 65%. Wall motion was normal; there were no regional wall motion abnormalities. Doppler parameters are consistent with abnormal left ventricular relaxation (grade 1 diastolic dysfunction). - Ventricular septum: There appears to be a small perimembranous VSD. D-shaped interventricular septum in diastole suggests RV volume overload. - Aortic valve: There was no stenosis. - Mitral valve: No significant regurgitation. - Left atrium: The atrium was mildly dilated. - Right ventricle: The cavity size was moderately dilated. Systolic function was normal. - Pulmonary arteries: No complete TR doppler jet so unable to estimate PA systolic pressure. - Systemic veins: IVC measured 2.3 cm with some respirophasic variation, suggesting RA pressure 10 mmHg. Impressions:  - Normal LV size and systolic function, EF 60-65%. Moderately dilated RV with normal systolic function. There appears to be a small VSD. The VSD does not appear to be large enough to have caused the RV dilation. Will need further evaluation.     Medications:   Scheduled Medications: . aspirin EC  81 mg Oral Daily  .  diazepam  2.5 mg Oral Once  . furosemide  40 mg Intravenous BID  . heparin  5,000 Units Subcutaneous Q8H  . pantoprazole  40 mg Oral Daily  . sodium chloride  3 mL Intravenous Q12H      PRN Medications:  sodium chloride, sodium chloride   Assessment:   1. Acute on chronic diastolic heart failure with history of hemachromatosis, LVEF 60-65% by echocardiogram. Grade 1 diastolic dysfunction.  2. Small VSD with dilated RV, unable to assess PASP by transthoracic echocardiogram. As per Dr. Alford Highland followup note, patient was not able to tolerate cardiac MRI due to claustrophobia, will need to be considered for TEE and cardiac  catheterization first of the week to better exclude pulmonary hypertension.  3. CKD, stage 2. Creatinine 1.5.  4. Conduction system disease as evidenced by right bundle branch block and left anterior fascicular block on ECG.   Plan/Discussion:    Discussed with patient. Will put him on the schedule tentatively for a left and right heart catheterization on Monday to better assess for left to right shunt and potential pulmonary hypertension. Can continue Lasix, follow creatinine. Add low-dose beta blocker.   Jonelle Sidle, M.D., F.A.C.C.

## 2013-07-24 LAB — URINE MICROSCOPIC-ADD ON

## 2013-07-24 LAB — URINALYSIS, ROUTINE W REFLEX MICROSCOPIC
Bilirubin Urine: NEGATIVE
Leukocytes, UA: NEGATIVE
Nitrite: NEGATIVE
Specific Gravity, Urine: 1.016 (ref 1.005–1.030)
Urobilinogen, UA: 0.2 mg/dL (ref 0.0–1.0)

## 2013-07-24 LAB — BASIC METABOLIC PANEL
BUN: 22 mg/dL (ref 6–23)
CO2: 30 mEq/L (ref 19–32)
Calcium: 9.2 mg/dL (ref 8.4–10.5)
Chloride: 97 mEq/L (ref 96–112)
Creatinine, Ser: 1.51 mg/dL — ABNORMAL HIGH (ref 0.50–1.35)
GFR calc non Af Amer: 51 mL/min — ABNORMAL LOW (ref >90)
Glucose, Bld: 127 mg/dL — ABNORMAL HIGH (ref 70–99)
Potassium: 3.7 mEq/L (ref 3.5–5.1)
Sodium: 139 mEq/L (ref 135–145)

## 2013-07-24 LAB — CBC
Hemoglobin: 16.7 g/dL (ref 13.0–17.0)
MCH: 29.3 pg (ref 26.0–34.0)
MCHC: 33.5 g/dL (ref 30.0–36.0)
MCV: 87.4 fL (ref 78.0–100.0)
Platelets: 298 10*3/uL (ref 150–400)
RDW: 15.7 % — ABNORMAL HIGH (ref 11.5–15.5)
WBC: 12.4 10*3/uL — ABNORMAL HIGH (ref 4.0–10.5)

## 2013-07-24 MED ORDER — SODIUM CHLORIDE 0.9 % IV SOLN
250.0000 mL | INTRAVENOUS | Status: DC | PRN
  Administered 2013-07-25: 250 mL via INTRAVENOUS

## 2013-07-24 MED ORDER — SODIUM CHLORIDE 0.9 % IJ SOLN
3.0000 mL | Freq: Two times a day (BID) | INTRAMUSCULAR | Status: DC
  Administered 2013-07-24 – 2013-07-25 (×2): 3 mL via INTRAVENOUS

## 2013-07-24 MED ORDER — SODIUM CHLORIDE 0.9 % IJ SOLN: 3.0000 mL | INTRAMUSCULAR | Status: DC | PRN

## 2013-07-24 NOTE — Progress Notes (Signed)
Primary cardiologist: Dr. Marca Ancona  Subjective:   Still with "tickle" in his throat and intermittent dry cough. No chest pain. Breathing much more easily.   Objective:   Temp:  [97.2 F (36.2 C)-98.5 F (36.9 C)] 98.5 F (36.9 C) (10/19 0509) Pulse Rate:  [96-106] 96 (10/19 0509) Resp:  [18-20] 20 (10/19 0509) BP: (131-145)/(61-69) 131/61 mmHg (10/19 0509) SpO2:  [95 %-98 %] 95 % (10/19 0509) Weight:  [258 lb 13.1 oz (117.4 kg)] 258 lb 13.1 oz (117.4 kg) (10/19 0509) Last BM Date: 07/23/13  Filed Weights   07/22/13 0709 07/23/13 0459 07/24/13 0509  Weight: 264 lb 1.6 oz (119.795 kg) 259 lb 11.2 oz (117.8 kg) 258 lb 13.1 oz (117.4 kg)    Intake/Output Summary (Last 24 hours) at 07/24/13 0903 Last data filed at 07/24/13 0514  Gross per 24 hour  Intake    480 ml  Output   3200 ml  Net  -2720 ml    Telemetry: Sinus rhythm.  Exam:  General: Overweight, no distress.  Lungs: Decreased breath sounds.  Cardiac: RRR, 2/6 systolic murmur left apical region, S4.  Abdomen: Protuberant, nontender.  Extremities: Trace edema.  Lab Results:  Basic Metabolic Panel:  Recent Labs Lab 07/22/13 0313 07/22/13 0750 07/22/13 1646 07/23/13 0530 07/24/13 0545  NA 138  --  138 140 139  K 4.5  --  3.7 3.6 3.7  CL 101  --  98 100 97  CO2 24  --  23 27 30   GLUCOSE 141*  --  203* 108* 127*  BUN 17  --  16 18 22   CREATININE 1.56* 1.49* 1.50* 1.59* 1.51*  CALCIUM 8.9  --  9.2 9.0 9.2  MG  --  1.7  --   --   --     Liver Function Tests:  Recent Labs Lab 07/22/13 0313  AST 40*  ALT 49  ALKPHOS 63  BILITOT 0.6  PROT 6.7  ALBUMIN 3.3*    CBC:  Recent Labs Lab 07/22/13 0313 07/22/13 0750 07/24/13 0545  WBC 10.6* 11.8* 12.4*  HGB 16.0 16.6 16.7  HCT 45.8 47.2 49.8  MCV 86.7 87.1 87.4  PLT 250 250 298    Cardiac Enzymes:  Recent Labs Lab 07/22/13 0750 07/22/13 1300 07/22/13 1756  TROPONINI <0.30 <0.30 <0.30    BNP:  Recent Labs   07/22/13 0313 07/23/13 0530  PROBNP 1077.0* 943.5*    Coagulation:  Recent Labs Lab 07/22/13 0750  INR 1.04    Echocardiogram: Study Conclusions  - Left ventricle: The cavity size was normal. Wall thickness was normal. Systolic function was normal. The estimated ejection fraction was in the range of 60% to 65%. Wall motion was normal; there were no regional wall motion abnormalities. Doppler parameters are consistent with abnormal left ventricular relaxation (grade 1 diastolic dysfunction). - Ventricular septum: There appears to be a small perimembranous VSD. D-shaped interventricular septum in diastole suggests RV volume overload. - Aortic valve: There was no stenosis. - Mitral valve: No significant regurgitation. - Left atrium: The atrium was mildly dilated. - Right ventricle: The cavity size was moderately dilated. Systolic function was normal. - Pulmonary arteries: No complete TR doppler jet so unable to estimate PA systolic pressure. - Systemic veins: IVC measured 2.3 cm with some respirophasic variation, suggesting RA pressure 10 mmHg. Impressions:  - Normal LV size and systolic function, EF 60-65%. Moderately dilated RV with normal systolic function. There appears to be a small VSD. The VSD  does not appear to be large enough to have caused the RV dilation. Will need further evaluation.    Medications:   Scheduled Medications: . aspirin EC  81 mg Oral Daily  . carvedilol  3.125 mg Oral BID WC  . diazepam  2.5 mg Oral Once  . furosemide  40 mg Intravenous BID  . heparin  5,000 Units Subcutaneous Q8H  . pantoprazole  40 mg Oral Daily  . sodium chloride  3 mL Intravenous Q12H     PRN Medications: sodium chloride, acetaminophen, alum & mag hydroxide-simeth, guaiFENesin-dextromethorphan, hydrocortisone cream, loperamide, magnesium hydroxide, pramoxine-mineral oil-zinc, sodium chloride, zolpidem   Assessment:   1. Acute on chronic diastolic heart  failure with history of hemachromatosis, LVEF 60-65% by echocardiogram. Grade 1 diastolic dysfunction. Good diuresis.  2. Small VSD with dilated RV, unable to assess PASP by transthoracic echocardiogram. As per Dr. Alford Highland followup note, patient was not able to tolerate cardiac MRI due to claustrophobia, will need to be considered for TEE and cardiac catheterization first of the week to better exclude pulmonary hypertension.  3. CKD, stage 2. Creatinine 1.5.  4. Conduction system disease as evidenced by right bundle branch block and left anterior fascicular block on ECG.   Plan/Discussion:    Patient added on the schedule for a left and right heart catheterization on Monday to better assess for left to right shunt and potential pulmonary hypertension. Can continue Lasix, follow creatinine - remains stable 1.5. Low-dose beta blocker added.   Jonelle Sidle, M.D., F.A.C.C.

## 2013-07-25 ENCOUNTER — Encounter (HOSPITAL_COMMUNITY): Payer: Self-pay | Admitting: Gastroenterology

## 2013-07-25 ENCOUNTER — Encounter (HOSPITAL_COMMUNITY)
Admission: EM | Disposition: A | Discharge status: Home / Self Care | Payer: BC Managed Care – PPO | Source: Home / Self Care | Attending: Cardiology

## 2013-07-25 ENCOUNTER — Encounter (HOSPITAL_COMMUNITY)
Admission: EM | Disposition: A | Discharge status: Home / Self Care | Payer: Self-pay | Source: Home / Self Care | Attending: Cardiology

## 2013-07-25 DIAGNOSIS — N189 Chronic kidney disease, unspecified: Secondary | ICD-10-CM

## 2013-07-25 DIAGNOSIS — Q210 Ventricular septal defect: Secondary | ICD-10-CM

## 2013-07-25 DIAGNOSIS — I2510 Atherosclerotic heart disease of native coronary artery without angina pectoris: Secondary | ICD-10-CM

## 2013-07-25 HISTORY — PX: LEFT AND RIGHT HEART CATHETERIZATION WITH CORONARY ANGIOGRAM: SHX5449

## 2013-07-25 HISTORY — PX: TEE WITHOUT CARDIOVERSION: SHX5443

## 2013-07-25 LAB — POCT I-STAT 3, VENOUS BLOOD GAS (G3P V)
Acid-Base Excess: 2 mmol/L (ref 0.0–2.0)
Acid-Base Excess: 2 mmol/L (ref 0.0–2.0)
Acid-Base Excess: 2 mmol/L (ref 0.0–2.0)
Acid-Base Excess: 3 mmol/L — ABNORMAL HIGH (ref 0.0–2.0)
Acid-base deficit: 2 mmol/L (ref 0.0–2.0)
Bicarbonate: 28 mEq/L — ABNORMAL HIGH (ref 20.0–24.0)
Bicarbonate: 29.4 mEq/L — ABNORMAL HIGH (ref 20.0–24.0)
Bicarbonate: 30.4 mEq/L — ABNORMAL HIGH (ref 20.0–24.0)
O2 Saturation: 58 %
O2 Saturation: 69 %
O2 Saturation: 70 %
O2 Saturation: 84 %
O2 Saturation: 98 %
TCO2: 26 mmol/L (ref 0–100)
TCO2: 29 mmol/L (ref 0–100)
TCO2: 29 mmol/L (ref 0–100)
TCO2: 31 mmol/L (ref 0–100)
TCO2: 32 mmol/L (ref 0–100)
TCO2: 32 mmol/L (ref 0–100)
pCO2, Ven: 42.5 mmHg — ABNORMAL LOW (ref 45.0–50.0)
pCO2, Ven: 43.4 mmHg — ABNORMAL LOW (ref 45.0–50.0)
pCO2, Ven: 46.1 mmHg (ref 45.0–50.0)
pCO2, Ven: 48.4 mmHg (ref 45.0–50.0)
pCO2, Ven: 51.5 mmHg — ABNORMAL HIGH (ref 45.0–50.0)
pCO2, Ven: 52.7 mmHg — ABNORMAL HIGH (ref 45.0–50.0)
pH, Ven: 7.342 — ABNORMAL HIGH (ref 7.250–7.300)
pH, Ven: 7.366 — ABNORMAL HIGH (ref 7.250–7.300)
pH, Ven: 7.378 — ABNORMAL HIGH (ref 7.250–7.300)
pH, Ven: 7.392 — ABNORMAL HIGH (ref 7.250–7.300)
pH, Ven: 7.406 — ABNORMAL HIGH (ref 7.250–7.300)
pO2, Ven: 33 mmHg (ref 30.0–45.0)
pO2, Ven: 34 mmHg (ref 30.0–45.0)
pO2, Ven: 38 mmHg (ref 30.0–45.0)
pO2, Ven: 99 mmHg — ABNORMAL HIGH (ref 30.0–45.0)

## 2013-07-25 LAB — POCT I-STAT 3, ART BLOOD GAS (G3+)
O2 Saturation: 96 %
TCO2: 29 mmol/L (ref 0–100)
pCO2 arterial: 47.7 mmHg — ABNORMAL HIGH (ref 35.0–45.0)
pH, Arterial: 7.375 (ref 7.350–7.450)

## 2013-07-25 LAB — BASIC METABOLIC PANEL
CO2: 27 mEq/L (ref 19–32)
Calcium: 8.4 mg/dL (ref 8.4–10.5)
Chloride: 97 mEq/L (ref 96–112)
Creatinine, Ser: 1.39 mg/dL — ABNORMAL HIGH (ref 0.50–1.35)
GFR calc Af Amer: 66 mL/min — ABNORMAL LOW (ref >90)
GFR calc non Af Amer: 57 mL/min — ABNORMAL LOW (ref >90)
Glucose, Bld: 122 mg/dL — ABNORMAL HIGH (ref 70–99)
Potassium: 3.2 mEq/L — ABNORMAL LOW (ref 3.5–5.1)
Sodium: 137 mEq/L (ref 135–145)

## 2013-07-25 LAB — CBC
Hemoglobin: 15.9 g/dL (ref 13.0–17.0)
MCH: 29.2 pg (ref 26.0–34.0)
MCV: 86.2 fL (ref 78.0–100.0)
Platelets: 262 10*3/uL (ref 150–400)
RBC: 5.44 MIL/uL (ref 4.22–5.81)
RDW: 15.4 % (ref 11.5–15.5)
WBC: 10.9 10*3/uL — ABNORMAL HIGH (ref 4.0–10.5)

## 2013-07-25 SURGERY — LEFT AND RIGHT HEART CATHETERIZATION WITH CORONARY ANGIOGRAM
Anesthesia: LOCAL

## 2013-07-25 SURGERY — ECHOCARDIOGRAM, TRANSESOPHAGEAL
Anesthesia: Moderate Sedation

## 2013-07-25 MED ORDER — SODIUM CHLORIDE 0.9 % IV SOLN
INTRAVENOUS | Status: AC
  Administered 2013-07-25: 18:00:00 via INTRAVENOUS

## 2013-07-25 MED ORDER — LIDOCAINE HCL (PF) 1 % IJ SOLN
INTRAMUSCULAR | Status: AC
  Filled 2013-07-25: qty 30

## 2013-07-25 MED ORDER — POTASSIUM CHLORIDE CRYS ER 20 MEQ PO TBCR
40.0000 meq | EXTENDED_RELEASE_TABLET | Freq: Once | ORAL | Status: AC
  Administered 2013-07-25: 40 meq via ORAL
  Filled 2013-07-25: qty 2

## 2013-07-25 MED ORDER — HEPARIN (PORCINE) IN NACL 2-0.9 UNIT/ML-% IJ SOLN
INTRAMUSCULAR | Status: AC
  Filled 2013-07-25: qty 1000

## 2013-07-25 MED ORDER — FENTANYL CITRATE 0.05 MG/ML IJ SOLN
INTRAMUSCULAR | Status: AC
  Filled 2013-07-25: qty 2

## 2013-07-25 MED ORDER — BUTAMBEN-TETRACAINE-BENZOCAINE 2-2-14 % EX AERO
INHALATION_SPRAY | CUTANEOUS | Status: DC | PRN
  Administered 2013-07-25: 2 via TOPICAL

## 2013-07-25 MED ORDER — FENTANYL CITRATE 0.05 MG/ML IJ SOLN
INTRAMUSCULAR | Status: DC | PRN
  Administered 2013-07-25 (×2): 25 ug via INTRAVENOUS

## 2013-07-25 MED ORDER — MIDAZOLAM HCL 5 MG/ML IJ SOLN
INTRAMUSCULAR | Status: AC
  Filled 2013-07-25: qty 2

## 2013-07-25 MED ORDER — NITROGLYCERIN 0.2 MG/ML ON CALL CATH LAB
INTRAVENOUS | Status: AC
  Filled 2013-07-25: qty 1

## 2013-07-25 MED ORDER — ONDANSETRON HCL 4 MG/2ML IJ SOLN: 4.0000 mg | Freq: Four times a day (QID) | INTRAMUSCULAR | Status: DC | PRN

## 2013-07-25 MED ORDER — MIDAZOLAM HCL 10 MG/2ML IJ SOLN
INTRAMUSCULAR | Status: DC | PRN
  Administered 2013-07-25 (×2): 1 mg via INTRAVENOUS
  Administered 2013-07-25 (×3): 2 mg via INTRAVENOUS

## 2013-07-25 MED ORDER — SODIUM CHLORIDE 0.9 % IV SOLN: INTRAVENOUS | Status: DC

## 2013-07-25 NOTE — CV Procedure (Signed)
   Cardiac Catheterization Procedure Note  Name: Kevin Wiggins MRN: [id number] DOB: October 02, 1961  Procedure: Right Heart Cath, Left Heart Cath, Selective Coronary Angiography, LV angiography  Indication: CHF, VSD  Procedural Details: The right groin was prepped, draped, and anesthetized with 1% lidocaine. Using the modified Seldinger technique a 5 French sheath was placed in the right femoral artery and a 7 French sheath was placed in the right femoral vein. A Swan-Ganz catheter was used for the right heart catheterization. Standard protocol was followed for recording of right heart pressures and sampling of oxygen saturations. Fick cardiac output was calculated. Standard Judkins catheters were used for selective coronary angiography and left ventriculography. There were no immediate procedural complications. The patient was transferred to the post catheterization recovery area for further monitoring.  Procedural Findings: Hemodynamics RA 13/9 mean 6 RV 60/13 PA 56/22 mean 35 PCWP mean 18 LV 103/21 AO 106/60 mean 82  Oxygen saturations: PA 82 AO 97 SVC 59 IVC 67  Cardiac Output (Fick) 4.7  Cardiac Index (Fick) 2.1  QP:QS = 2.4   Coronary angiography: Coronary dominance: right  Left mainstem: Widely patent, no obstructive disease.   Left anterior descending (LAD): Normal in caliber, reaches the LV apex. There is minor diffuse irregularity without significant stenosis. The diagonal branches are normal in caliber.  Left circumflex (LCx): Patent throughout. The first OM is patent without obstructive disease. The PLA branches are patent.   Right coronary artery (RCA): Dominant vessel, normal in caliber, arises from right cusp. There is minor 30% stenosis at the origin of the RCA. Otherwise there is no significant disease. The acute marginal branch is patent and the PDA is small in caliber.  Left ventriculography: deferred  Final Conclusions:   1. Patent coronary arteries with  mild nonobstructive CAD 2. Moderate pulmonary HTN 3. O2 saturations consistent with significant left-right intracardiac shunt  Tonny Bollman 07/25/2013, 4:20 PM

## 2013-07-25 NOTE — Progress Notes (Signed)
To cath lab. Report given by [facility] rn.

## 2013-07-25 NOTE — H&P (View-Only) (Signed)
  Primary cardiologist: Dr. Dalton McLean  Subjective:   Intermittent dry cough, states that breathing is somewhat better however. No chest pain.   Objective:   Temp:  [98.1 F (36.7 C)-98.2 F (36.8 C)] 98.2 F (36.8 C) (10/18 0914) Pulse Rate:  [100-105] 105 (10/18 0914) Resp:  [18-20] 20 (10/18 0459) BP: (130-145)/(60-68) 145/62 mmHg (10/18 0914) SpO2:  [94 %-96 %] 95 % (10/18 0914) Weight:  [259 lb 11.2 oz (117.8 kg)] 259 lb 11.2 oz (117.8 kg) (10/18 0459) Last BM Date: 07/21/13  Filed Weights   07/22/13 0709 07/23/13 0459  Weight: 264 lb 1.6 oz (119.795 kg) 259 lb 11.2 oz (117.8 kg)    Intake/Output Summary (Last 24 hours) at 07/23/13 0934 Last data filed at 07/23/13 0834  Gross per 24 hour  Intake   1446 ml  Output   4075 ml  Net  -2629 ml    Telemetry: Sinus rhythm.  Exam:  General: Overweight, no distress.  Lungs: Decreased breath sounds.  Cardiac: RRR, 2/6 systolic murmur left apical region, S4.  Abdomen: Protuberant, nontender.  Extremities: Trace edema.  Lab Results:  Basic Metabolic Panel:  Recent Labs Lab 07/22/13 0313 07/22/13 0750 07/22/13 1646 07/23/13 0530  NA 138  --  138 140  K 4.5  --  3.7 3.6  CL 101  --  98 100  CO2 24  --  23 27  GLUCOSE 141*  --  203* 108*  BUN 17  --  16 18  CREATININE 1.56* 1.49* 1.50* 1.59*  CALCIUM 8.9  --  9.2 9.0  MG  --  1.7  --   --     Liver Function Tests:  Recent Labs Lab 07/22/13 0313  AST 40*  ALT 49  ALKPHOS 63  BILITOT 0.6  PROT 6.7  ALBUMIN 3.3*    CBC:  Recent Labs Lab 07/22/13 0313 07/22/13 0750  WBC 10.6* 11.8*  HGB 16.0 16.6  HCT 45.8 47.2  MCV 86.7 87.1  PLT 250 250    Cardiac Enzymes:  Recent Labs Lab 07/22/13 0750 07/22/13 1300 07/22/13 1756  TROPONINI <0.30 <0.30 <0.30    BNP:  Recent Labs  07/22/13 0313 07/23/13 0530  PROBNP 1077.0* 943.5*    Coagulation:  Recent Labs Lab 07/22/13 0750  INR 1.04    Echocardiogram: Study  Conclusions  - Left ventricle: The cavity size was normal. Wall thickness was normal. Systolic function was normal. The estimated ejection fraction was in the range of 60% to 65%. Wall motion was normal; there were no regional wall motion abnormalities. Doppler parameters are consistent with abnormal left ventricular relaxation (grade 1 diastolic dysfunction). - Ventricular septum: There appears to be a small perimembranous VSD. D-shaped interventricular septum in diastole suggests RV volume overload. - Aortic valve: There was no stenosis. - Mitral valve: No significant regurgitation. - Left atrium: The atrium was mildly dilated. - Right ventricle: The cavity size was moderately dilated. Systolic function was normal. - Pulmonary arteries: No complete TR doppler jet so unable to estimate PA systolic pressure. - Systemic veins: IVC measured 2.3 cm with some respirophasic variation, suggesting RA pressure 10 mmHg. Impressions:  - Normal LV size and systolic function, EF 60-65%. Moderately dilated RV with normal systolic function. There appears to be a small VSD. The VSD does not appear to be large enough to have caused the RV dilation. Will need further evaluation.     Medications:   Scheduled Medications: . aspirin EC  81 mg Oral Daily  .   diazepam  2.5 mg Oral Once  . furosemide  40 mg Intravenous BID  . heparin  5,000 Units Subcutaneous Q8H  . pantoprazole  40 mg Oral Daily  . sodium chloride  3 mL Intravenous Q12H      PRN Medications:  sodium chloride, sodium chloride   Assessment:   1. Acute on chronic diastolic heart failure with history of hemachromatosis, LVEF 60-65% by echocardiogram. Grade 1 diastolic dysfunction.  2. Small VSD with dilated RV, unable to assess PASP by transthoracic echocardiogram. As per Dr. McLean's followup note, patient was not able to tolerate cardiac MRI due to claustrophobia, will need to be considered for TEE and cardiac  catheterization first of the week to better exclude pulmonary hypertension.  3. CKD, stage 2. Creatinine 1.5.  4. Conduction system disease as evidenced by right bundle branch block and left anterior fascicular block on ECG.   Plan/Discussion:    Discussed with patient. Will put him on the schedule tentatively for a left and right heart catheterization on Monday to better assess for left to right shunt and potential pulmonary hypertension. Can continue Lasix, follow creatinine. Add low-dose beta blocker.   Cecile Gillispie G. Gaia Gullikson, M.D., F.A.C.C.  

## 2013-07-25 NOTE — Interval H&P Note (Signed)
History and Physical Interval Note:  07/25/2013 3:44 PM  Kevin Wiggins  has presented today for surgery, with the diagnosis of look at valve   The various methods of treatment have been discussed with the patient and family. After consideration of risks, benefits and other options for treatment, the patient has consented to  Procedure(s): TRANSESOPHAGEAL ECHOCARDIOGRAM (TEE) (N/A) as a surgical intervention .  The patient's history has been reviewed, patient examined, no change in status, stable for surgery.  I have reviewed the patient's chart and labs.  Questions were answered to the patient's satisfaction.    Cath Lab Visit (complete for each Cath Lab visit)  Clinical Evaluation Leading to the Procedure:   ACS: no  Non-ACS:    Anginal Classification: No Symptoms  Anti-ischemic medical therapy: Minimal Therapy (1 class of medications)  Non-Invasive Test Results: No non-invasive testing performed  Prior CABG: No previous CABG         Tonny Bollman

## 2013-07-25 NOTE — Interval H&P Note (Signed)
History and Physical Interval Note:  07/25/2013 2:48 PM  Kevin Wiggins  has presented today for surgery, with the diagnosis of look at valve   The various methods of treatment have been discussed with the patient and family. After consideration of risks, benefits and other options for treatment, the patient has consented to  Procedure(s): TRANSESOPHAGEAL ECHOCARDIOGRAM (TEE) (N/A) as a surgical intervention .  The patient's history has been reviewed, patient examined, no change in status, stable for surgery.  I have reviewed the patient's chart and labs.  Questions were answered to the patient's satisfaction.     Kevin Wiggins, H

## 2013-07-25 NOTE — Progress Notes (Signed)
  Echocardiogram Echocardiogram Transesophageal has been performed.  Kevin Wiggins 07/25/2013, 4:22 PM

## 2013-07-25 NOTE — Progress Notes (Addendum)
    Subjective:  Feeling better. Had some 'anxiety' overnight. No chest pain or dyspnea.   Objective:  Vital Signs in the last 24 hours: Temp:  [97.8 F (36.6 C)-98.5 F (36.9 C)] 97.9 F (36.6 C) (10/20 0530) Pulse Rate:  [91-100] 91 (10/20 0530) Resp:  [18-20] 18 (10/20 0530) BP: (110-137)/(56-70) 110/57 mmHg (10/20 0530) SpO2:  [94 %-98 %] 94 % (10/20 0530) Weight:  [256 lb 9.9 oz (116.4 kg)] 256 lb 9.9 oz (116.4 kg) (10/20 0530)  Intake/Output from previous day: 10/19 0701 - 10/20 0700 In: 720 [P.O.:720] Out: 2500 [Urine:2500]  Physical Exam: Pt is alert and oriented, NAD HEENT: normal Neck: JVP - normal, carotids 2+= without bruits Lungs: CTA bilaterally CV: RRR, distant, grade 3/6 systolic murmur at the LSB Abd: soft, NT, Positive BS, obese Ext: no C/C/E, distal pulses intact and equal Skin: warm/dry no rash   Lab Results:  Recent Labs  07/24/13 0545 07/25/13 0558  WBC 12.4* 10.9*  HGB 16.7 15.9  PLT 298 262    Recent Labs  07/24/13 0545 07/25/13 0558  NA 139 137  K 3.7 3.2*  CL 97 97  CO2 30 27  GLUCOSE 127* 122*  BUN 22 23  CREATININE 1.51* 1.39*    Recent Labs  07/22/13 1300 07/22/13 1756  TROPONINI <0.30 <0.30    Cardiac Studies: 2D Echo: Study Conclusions  - Left ventricle: The cavity size was normal. Wall thickness was normal. Systolic function was normal. The estimated ejection fraction was in the range of 60% to 65%. Wall motion was normal; there were no regional wall motion abnormalities. Doppler parameters are consistent with abnormal left ventricular relaxation (grade 1 diastolic dysfunction). - Ventricular septum: There appears to be a small perimembranous VSD. D-shaped interventricular septum in diastole suggests RV volume overload. - Aortic valve: There was no stenosis. - Mitral valve: No significant regurgitation. - Left atrium: The atrium was mildly dilated. - Right ventricle: The cavity size was moderately  dilated. Systolic function was normal. - Pulmonary arteries: No complete TR doppler jet so unable to estimate PA systolic pressure. - Systemic veins: IVC measured 2.3 cm with some respirophasic variation, suggesting RA pressure 10 mmHg. Impressions:  - Normal LV size and systolic function, EF 60-65%. Moderately dilated RV with normal systolic function. There appears to be a small VSD. The VSD does not appear to be large enough to have caused the RV dilation. Will need further evaluation.  Tele: Sinus rhythm  Assessment/Plan:  1. Acute on chronic diastolic heart failure, clinically improved 2. VSD 3. Dilated RV, suspicion for pulmonary HTN 4. CKD, stage 2  Plan right and left heart cath today. I have reviewed risks, indications, alternatives with the patient. He understands and agrees to proceed. Will plan limited contrast with his kidney disease. Continue lasix/carvedilol for heart failure.   Tonny Bollman, M.D. 07/25/2013, 8:57 AM

## 2013-07-25 NOTE — H&P (View-Only) (Signed)
    Subjective:  Feeling better. Had some 'anxiety' overnight. No chest pain or dyspnea.   Objective:  Vital Signs in the last 24 hours: Temp:  [97.8 F (36.6 C)-98.5 F (36.9 C)] 97.9 F (36.6 C) (10/20 0530) Pulse Rate:  [91-100] 91 (10/20 0530) Resp:  [18-20] 18 (10/20 0530) BP: (110-137)/(56-70) 110/57 mmHg (10/20 0530) SpO2:  [94 %-98 %] 94 % (10/20 0530) Weight:  [256 lb 9.9 oz (116.4 kg)] 256 lb 9.9 oz (116.4 kg) (10/20 0530)  Intake/Output from previous day: 10/19 0701 - 10/20 0700 In: 720 [P.O.:720] Out: 2500 [Urine:2500]  Physical Exam: Pt is alert and oriented, NAD HEENT: normal Neck: JVP - normal, carotids 2+= without bruits Lungs: CTA bilaterally CV: RRR, distant, grade 3/6 systolic murmur at the LSB Abd: soft, NT, Positive BS, obese Ext: no C/C/E, distal pulses intact and equal Skin: warm/dry no rash   Lab Results:  Recent Labs  07/24/13 0545 07/25/13 0558  WBC 12.4* 10.9*  HGB 16.7 15.9  PLT 298 262    Recent Labs  07/24/13 0545 07/25/13 0558  NA 139 137  K 3.7 3.2*  CL 97 97  CO2 30 27  GLUCOSE 127* 122*  BUN 22 23  CREATININE 1.51* 1.39*    Recent Labs  07/22/13 1300 07/22/13 1756  TROPONINI <0.30 <0.30    Cardiac Studies: 2D Echo: Study Conclusions  - Left ventricle: The cavity size was normal. Wall thickness was normal. Systolic function was normal. The estimated ejection fraction was in the range of 60% to 65%. Wall motion was normal; there were no regional wall motion abnormalities. Doppler parameters are consistent with abnormal left ventricular relaxation (grade 1 diastolic dysfunction). - Ventricular septum: There appears to be a small perimembranous VSD. D-shaped interventricular septum in diastole suggests RV volume overload. - Aortic valve: There was no stenosis. - Mitral valve: No significant regurgitation. - Left atrium: The atrium was mildly dilated. - Right ventricle: The cavity size was moderately  dilated. Systolic function was normal. - Pulmonary arteries: No complete TR doppler jet so unable to estimate PA systolic pressure. - Systemic veins: IVC measured 2.3 cm with some respirophasic variation, suggesting RA pressure 10 mmHg. Impressions:  - Normal LV size and systolic function, EF 60-65%. Moderately dilated RV with normal systolic function. There appears to be a small VSD. The VSD does not appear to be large enough to have caused the RV dilation. Will need further evaluation.  Tele: Sinus rhythm  Assessment/Plan:  1. Acute on chronic diastolic heart failure, clinically improved 2. VSD 3. Dilated RV, suspicion for pulmonary HTN 4. CKD, stage 2  Plan right and left heart cath today. I have reviewed risks, indications, alternatives with the patient. He understands and agrees to proceed. Will plan limited contrast with his kidney disease. Continue lasix/carvedilol for heart failure.   Jeanett Antonopoulos, M.D. 07/25/2013, 8:57 AM     

## 2013-07-25 NOTE — CV Procedure (Signed)
    Transesophageal Echocardiogram Note  Kevin Wiggins [id number] [birth year]  Procedure: Transesophageal Echocardiogram Indications: VSD  Procedure Details Consent: Obtained Time Out: Verified patient identification, verified procedure, site/side was marked, verified correct patient position, special equipment/implants available, Radiology Safety Procedures followed,  medications/allergies/relevent history reviewed, required imaging and test results available.  Performed  Medications: Fentanyl: 50 mcg Versed: 8 mg  Left Ventrical:  Normal size and function  Right ventricle: moderately dilated, mildly decreased RV function  Right atrium: normal size  Interventricular septum: a small restrictive perimembranous VSD, peak velocity 4.75 m/sec, peak gradient 90 mmHg  Mitral Valve: normal, no MR  Aortic Valve: normal, trivial AI  Tricuspid Valve: normal, trivial TR, normal RVSP   Pulmonic Valve: no PR  Left Atrium/ Left atrial appendage: mildly enlarged LA  Atrial septum: no PFO  Aorta: mild atherosclerotic plague   Complications: No apparent complications Patient did tolerate procedure well.   Tobias Alexander, H   07/25/2013, 2:49 PM

## 2013-07-26 ENCOUNTER — Other Ambulatory Visit: Payer: Self-pay | Admitting: Physician Assistant

## 2013-07-26 ENCOUNTER — Encounter (HOSPITAL_COMMUNITY): Payer: Self-pay | Admitting: Cardiology

## 2013-07-26 DIAGNOSIS — N189 Chronic kidney disease, unspecified: Secondary | ICD-10-CM

## 2013-07-26 DIAGNOSIS — Q210 Ventricular septal defect: Secondary | ICD-10-CM

## 2013-07-26 LAB — CBC
HCT: 46.1 % (ref 39.0–52.0)
Hemoglobin: 15.5 g/dL (ref 13.0–17.0)
MCH: 29.4 pg (ref 26.0–34.0)
MCHC: 33.6 g/dL (ref 30.0–36.0)
Platelets: 271 10*3/uL (ref 150–400)
RDW: 15.4 % (ref 11.5–15.5)

## 2013-07-26 LAB — BASIC METABOLIC PANEL
BUN: 21 mg/dL (ref 6–23)
Calcium: 8.7 mg/dL (ref 8.4–10.5)
Chloride: 96 mEq/L (ref 96–112)
GFR calc Af Amer: 64 mL/min — ABNORMAL LOW (ref >90)
GFR calc non Af Amer: 55 mL/min — ABNORMAL LOW (ref >90)
Glucose, Bld: 132 mg/dL — ABNORMAL HIGH (ref 70–99)
Potassium: 4.3 mEq/L (ref 3.5–5.1)
Sodium: 135 mEq/L (ref 135–145)

## 2013-07-26 MED ORDER — PHENOL 1.4 % MT LIQD
1.0000 | OROMUCOSAL | Status: DC | PRN
  Administered 2013-07-26: 1 via OROMUCOSAL
  Filled 2013-07-26: qty 177

## 2013-07-26 MED ORDER — CARVEDILOL 6.25 MG PO TABS: 6.2500 mg | ORAL_TABLET | Freq: Two times a day (BID) | ORAL | Status: DC

## 2013-07-26 MED ORDER — FUROSEMIDE 40 MG PO TABS
40.0000 mg | ORAL_TABLET | Freq: Every day | ORAL | Status: DC
  Administered 2013-07-26: 10:00:00 40 mg via ORAL
  Filled 2013-07-26: qty 1

## 2013-07-26 MED ORDER — POTASSIUM CHLORIDE CRYS ER 20 MEQ PO TBCR
40.0000 meq | EXTENDED_RELEASE_TABLET | Freq: Once | ORAL | Status: AC
  Administered 2013-07-26: 10:00:00 40 meq via ORAL
  Filled 2013-07-26: qty 2

## 2013-07-26 MED ORDER — ASPIRIN 81 MG PO TBEC: 81.0000 mg | DELAYED_RELEASE_TABLET | Freq: Every day | ORAL | Status: DC

## 2013-07-26 MED ORDER — POTASSIUM CHLORIDE ER 20 MEQ PO TBCR: 20.0000 meq | EXTENDED_RELEASE_TABLET | Freq: Every day | ORAL | Status: DC

## 2013-07-26 MED ORDER — FUROSEMIDE 40 MG PO TABS: 40.0000 mg | ORAL_TABLET | Freq: Every day | ORAL | Status: DC

## 2013-07-26 MED ORDER — CARVEDILOL 6.25 MG PO TABS
6.2500 mg | ORAL_TABLET | Freq: Two times a day (BID) | ORAL | Status: DC
  Filled 2013-07-26 (×2): qty 1

## 2013-07-26 NOTE — Progress Notes (Signed)
Pt c/o rt groin tenderness with palpation and movement and sore throat since TEE yesterday.  Tylenol given po and chloraseptic ordered from pharmacy.  Rt groin puffy but soft and no different than left groin.  No tenderness to rt flank.  BP 145/61, tele SR 90's, hr unchanged for past 12 hours.

## 2013-07-26 NOTE — Progress Notes (Signed)
Pt walked without complication. R groin site stable. No bruit, hematoma, bleeding. Dayna Dunn PA-C

## 2013-07-26 NOTE — Progress Notes (Addendum)
Patient ID: Kevin Wiggins, male   DOB: [birth year], 52 y.o.   MRN: [id number]    SUBJECTIVE: Still has cough.  No dyspnea walking halls.  Weight down about 14 lbs total.  Had LHC/RHC and TEE yesterday.  Has pain at cath site.  RHC:   RA 13/9 mean 6  RV 60/13  PA 56/22 mean 35  PCWP mean 18  LV 103/21  AO 106/60 mean 82  Oxygen saturations:  PA 82  AO 97  SVC 59  IVC 67  Cardiac Output (Fick) 4.7  Cardiac Index (Fick) 2.1  QP:QS = 2.4    . aspirin EC  81 mg Oral Daily  . carvedilol  3.125 mg Oral BID WC  . diazepam  2.5 mg Oral Once  . furosemide  40 mg Oral Daily  . heparin  5,000 Units Subcutaneous Q8H  . pantoprazole  40 mg Oral Daily  . potassium chloride  40 mEq Oral Once     Filed Vitals:   07/25/13 2003 07/25/13 2100 07/26/13 0037 07/26/13 0607  BP: 142/69 151/65 151/71 145/61  Pulse: 98  96 94  Temp: 97.5 F (36.4 C)  98.1 F (36.7 C) 97.9 F (36.6 C)  TempSrc: Oral  Oral Oral  Resp: 16  15 18   Height:      Weight:   113.6 kg (250 lb 7.1 oz)   SpO2: 93%  96% 96%    Intake/Output Summary (Last 24 hours) at 07/26/13 0751 Last data filed at 07/25/13 2148  Gross per 24 hour  Intake 669.17 ml  Output   1675 ml  Net -1005.83 ml    LABS: Basic Metabolic Panel:  Recent Labs  40/98/11 0545 07/25/13 0558  NA 139 137  K 3.7 3.2*  CL 97 97  CO2 30 27  GLUCOSE 127* 122*  BUN 22 23  CREATININE 1.51* 1.39*  CALCIUM 9.2 8.4   Liver Function Tests: No results found for this basename: AST, ALT, ALKPHOS, BILITOT, PROT, ALBUMIN,  in the last 72 hours No results found for this basename: LIPASE, AMYLASE,  in the last 72 hours CBC:  Recent Labs  07/24/13 0545 07/25/13 0558  WBC 12.4* 10.9*  HGB 16.7 15.9  HCT 49.8 46.9  MCV 87.4 86.2  PLT 298 262   Cardiac Enzymes: No results found for this basename: CKTOTAL, CKMB, CKMBINDEX, TROPONINI,  in the last 72 hours BNP: No components found with this basename: POCBNP,  D-Dimer: No results found for  this basename: DDIMER,  in the last 72 hours Hemoglobin A1C: No results found for this basename: HGBA1C,  in the last 72 hours Fasting Lipid Panel: No results found for this basename: CHOL, HDL, LDLCALC, TRIG, CHOLHDL, LDLDIRECT,  in the last 72 hours Thyroid Function Tests: No results found for this basename: TSH, T4TOTAL, FREET3, T3FREE, THYROIDAB,  in the last 72 hours Anemia Panel: No results found for this basename: VITAMINB12, FOLATE, FERRITIN, TIBC, IRON, RETICCTPCT,  in the last 72 hours  RADIOLOGY: Dg Chest 2 View  07/22/2013   CLINICAL DATA:  Chest pain  EXAM: CHEST  2 VIEW  COMPARISON:  None.  FINDINGS: Normal heart size. Upper left mediastinal convexity is likely from ectatic vasculature. Diffuse interstitial prominence with Kerley B-lines. No significant effusion. No asymmetric opacity. No acute osseous findings.  IMPRESSION: Interstitial pulmonary edema.   Electronically Signed   By: Tiburcio Pea M.D.   On: 07/22/2013 03:34   US Renal  07/22/2013   CLINICAL DATA:  Acute  versus chronic kidney disease.  EXAM: RENAL/URINARY TRACT ULTRASOUND COMPLETE  COMPARISON:  Abdominal ultrasound 09/30/2005.  FINDINGS: Right Kidney  Length: 13.0 cm in length. Two anechoic lesions with increased through transmission measuring 2.9 x 2.5 x 2.8 cm in the upper pole and 3.0 x 2.5 x 2.7 cm in the lower pole, compatible with simple cysts. Echogenicity within normal limits. No hydronephrosis visualized.  Left Kidney  Length: 12.2 cm in length. Two anechoic lesions with increased through transmission are noted in the interpolar region and lower pole measuring 3.2 x 3.0 x 2.9 cm and 2.3 x 2.2 x 2.2 cm respectively, compatible with simple cysts. Echogenicity within normal limits. No hydronephrosis visualized.  Bladder  Appears normal for degree of bladder distention.  IMPRESSION: 1. No hydronephrosis or other acute abnormality. 2. Multiple small simple cysts in the kidneys bilaterally, as above.    Electronically Signed   By: Trudie Reed M.D.   On: 07/22/2013 13:30    PHYSICAL EXAM General: NAD Neck: JVP 8 cm, no thyromegaly or thyroid nodule.  Lungs: Clear to auscultation bilaterally with normal respiratory effort. CV: Nondisplaced PMI.  Heart regular S1/S2, no S3/S4, 2/6 systolic murmur along sternal border.  No peripheral edema.  No carotid bruit.  Normal pedal pulses.  Abdomen: Soft, nontender, no hepatosplenomegaly, no distention.  Neurologic: Alert and oriented x 3.  Psych: Normal affect. Extremities: Right groin cath site appears benign with no hematoma and only mild ecchymosis.  TELEMETRY: Reviewed telemetry pt in NSR  ASSESSMENT AND PLAN: 52 yo with h/o hemochromatosis and probable CKD presented with acute diastolic CHF.  He was found to have a perimembranous VSD with significant left to right shunt.  1. Acute diastolic CHF: In the setting of a perimembranous VSD.  Patient had a significant left to right shunt on cath despite a small-appearing VSD on TEE.  There was moderate pulmonary HTN and the RV was dilated.  This may be a hemodynamically significant VSD.  No ASD was noted on TEE.  We do need to look for anomalous pulmonary veins as a contributor to shunt.  He cannot tolerate cardiac MRI/MRA due to claustrophobia so will plan cardiac-gated CTA if creatinine returns stable.  If the VSD is the only anomaly found, suspect it needs closure.  The adult cardiologists at [facility] do not do percutaneous VSD closure.  I will check at [facility].  In the mean time, I think that we can transition him to po Lasix today and let him go home.  2. HTN: Would use Coreg 6.25 mg bid for home. 3. CKD: Stable.  Awaiting BMET this morning post-cath.  4. Right groin cath site appears benign.  Will have him walk this morning.  5. Disposition: If he does ok with walking, home today.  Needs followup with me next week with BMET to decide what to do with VSD (may overbook in).  Home on ASA 81, Coreg 6.25 mg  bid, Lasix 40 mg daily, KCl 20 mEq daily.  He will need a cardiac-gated CTA if creatinine is stable today.   Marca Ancona 07/26/2013 7:59 AM  Creatinine is 1.4.  Will not do coronary CTA today for pulmonary vein evaluation as he just had cardiac cath yesterday.  Would like it arranged for early next week (?Monday).  I will read.  I will need to see him in the office after the coronary CT is done.   Marca Ancona 07/26/2013 10:19 AM

## 2013-07-26 NOTE — Discharge Summary (Signed)
Discharge Summary   Patient ID: Kevin Wiggins MRN: [id number], DOB/AGE: [birth year] 52 y.o. Admit date: 07/22/2013 D/C date:     07/26/2013  Primary Cardiologist: [name]  Primary Discharge Diagnoses:  1. Acute diastolic CHF in the setting of perimembranous VSD 2. Perimembranous VSD with significant L-R shunt 3. Moderate pulmonary HTN with RV dilation 4. Mild nonobstructive CAD by cath 07/25/13 5. CKD 6. Hemochromatosis 7. HTN 8. H/o borderline diabetes A1C 6.6 so likely full diabetic now 9. RBBB/LAFB  Hospital Course: Kevin Wiggins is a 52 y/o HTN, CKD, hemochromatosis treated with blood removal who presented to [facility] 07/22/2013 with 2 weeks of worsening SOB. He also has had some chest pain and progressive orthopnea. He's been coughing for upwards of 2 weeks. No syncope or presyncope. CXR was c/w interstitial  pulmonary edema. EKG showed RBBB, Sinus tachycardia, inferior infarct (old), LAD, pBNP was 1077. He was felt to have new onset CHF and was started on IV Lasix. Troponins remained negative. TSH was WNL. Initially Dr. [name] wanted to pursue cardiac MRI to assess T2* but the patient was unable to tolerate due to claustrophobia. 2D Echo showed EF 60-65%, grade 1 d/d, small perimembranous VSD, D-shaped interventricular septum in diastole suggests RV volume overload. TEE was recommended. This demonstrated: Left Ventricle: Normal size and function  Right ventricle: moderately dilated, mildly decreased RV function  Right atrium: normal size  Interventricular septum: a small restrictive perimembranous VSD, peak velocity 4.75 m/sec, peak gradient 90 mmHg  Mitral Valve: normal, no MR  Aortic Valve: normal, trivial AI  Tricuspid Valve: normal, trivial TR, normal RVSP  Pulmonic Valve: no PR  Left Atrium/ Left atrial appendage: mildly enlarged LA  Atrial septum: no PFO  Aorta: mild atherosclerotic plague He also underwent cardiac cath demonstrating: 1. Patent coronary arteries with  mild nonobstructive CAD  2. Moderate pulmonary HTN  3. O2 saturations consistent with significant left-right intracardiac shunt With diuresis, wt 264->250lb and -8L net. The patient is felt to have had acute diastolic CHF in the setting of perimembranous VSD. He had a significant left to right shunt on cath as above despite a small-appearing VSD on TEE. There was moderate pulmonary HTN and the RV was dilated. This may be a hemodynamically significant VSD. No ASD was noted on TEE. Dr. [name] felt we do need to look for anomalous pulmonary veins as a contributor to shunt. Will plan cardiac-gated CTA for next week. If the VSD is the only anomaly found, suspect it needs closure. The adult cardiologists at [facility] do not do percutaneous VSD closure. Dr. [name] will check at [facility]. In the mean time, the patient was felt stable to transition to oral Lasix. Dr. [name] has seen and examined the patient today and feels he is stable for discharge. Our office is working on scheduling the coronary CTA for pulmonary vein evaluation for preferably Monday. The patient will f/u with Dr. [name] later next week after this study with BMET same day.  Of note, A1C was 6.6 this adm. Has h/o borderline DM - was instructed to contact PCP for further evaluation/management. Will defer to primary cardiologist at outpatient f/u to determine if statin should be started. The patient was also instructed to monitor his BP at home and call if tending to run high (Lotrel stopped this adm in lieu of starting Coreg).  Discharge Vitals: Blood pressure 159/90, pulse 95, temperature 97.7 F (36.5 C), temperature source Oral, resp. rate 20, height 5\' 9"  (1.753 m), weight 250  lb 7.1 oz (113.6 kg), SpO2 96.00%.  Labs: Lab Results  Component Value Date   WBC 12.1* 07/26/2013   HGB 15.5 07/26/2013   HCT 46.1 07/26/2013   MCV 87.5 07/26/2013   PLT 271 07/26/2013    Recent Labs Lab 07/22/13 0313  07/26/13 0835  NA 138  < > 135  K 4.5  < > 4.3    CL 101  < > 96  CO2 24  < > 30  BUN 17  < > 21  CREATININE 1.56*  < > 1.42*  CALCIUM 8.9  < > 8.7  PROT 6.7  --   --   BILITOT 0.6  --   --   ALKPHOS 63  --   --   ALT 49  --   --   AST 40*  --   --   GLUCOSE 141*  < > 132*  < > = values in this interval not displayed. troponins neg x 3 Lab Results  Component Value Date   CHOL 204* 07/22/2013   HDL 41 07/22/2013   LDLCALC 141* 07/22/2013   TRIG 109 07/22/2013    Diagnostic Studies/Procedures   Dg Chest 2 View 07/22/2013   CLINICAL DATA:  Chest pain  EXAM: CHEST  2 VIEW  COMPARISON:  None.  FINDINGS: Normal heart size. Upper left mediastinal convexity is likely from ectatic vasculature. Diffuse interstitial prominence with Kerley B-lines. No significant effusion. No asymmetric opacity. No acute osseous findings.  IMPRESSION: Interstitial pulmonary edema.   Electronically Signed   By: Tiburcio Pea M.D.   On: 07/22/2013 03:34   US Renal 07/22/2013   CLINICAL DATA:  Acute versus chronic kidney disease.  EXAM: RENAL/URINARY TRACT ULTRASOUND COMPLETE  COMPARISON:  Abdominal ultrasound 09/30/2005.  FINDINGS: Right Kidney  Length: 13.0 cm in length. Two anechoic lesions with increased through transmission measuring 2.9 x 2.5 x 2.8 cm in the upper pole and 3.0 x 2.5 x 2.7 cm in the lower pole, compatible with simple cysts. Echogenicity within normal limits. No hydronephrosis visualized.  Left Kidney  Length: 12.2 cm in length. Two anechoic lesions with increased through transmission are noted in the interpolar region and lower pole measuring 3.2 x 3.0 x 2.9 cm and 2.3 x 2.2 x 2.2 cm respectively, compatible with simple cysts. Echogenicity within normal limits. No hydronephrosis visualized.  Bladder  Appears normal for degree of bladder distention.  IMPRESSION: 1. No hydronephrosis or other acute abnormality. 2. Multiple small simple cysts in the kidneys bilaterally, as above.   Electronically Signed   By: Trudie Reed M.D.   On: 07/22/2013  13:30   TEE & cath as above.  2D Echo 07/22/13 - Left ventricle: The cavity size was normal. Wall thickness was normal. Systolic function was normal. The estimated ejection fraction was in the range of 60% to 65%. Wall motion was normal; there were no regional wall motion abnormalities. Doppler parameters are consistent with abnormal left ventricular relaxation (grade 1 diastolic dysfunction). - Ventricular septum: There appears to be a small perimembranous VSD. D-shaped interventricular septum in diastole suggests RV volume overload. - Aortic valve: There was no stenosis. - Mitral valve: No significant regurgitation. - Left atrium: The atrium was mildly dilated. - Right ventricle: The cavity size was moderately dilated. Systolic function was normal. - Pulmonary arteries: No complete TR doppler jet so unable to estimate PA systolic pressure. - Systemic veins: IVC measured 2.3 cm with some respirophasic variation, suggesting RA pressure 10 mmHg. Impressions: - Normal  LV size and systolic function, EF 60-65%. Moderately dilated RV with normal systolic function. There appears to be a small VSD. The VSD does not appear to be large enough to have caused the RV dilation. Will need further evaluation.  Discharge Medications     Medication List    STOP taking these medications       amLODipine-benazepril 10-20 MG per capsule  Commonly known as:  LOTREL      TAKE these medications       aspirin 81 MG EC tablet  Take 1 tablet (81 mg total) by mouth daily.     carvedilol 6.25 MG tablet  Commonly known as:  COREG  Take 1 tablet (6.25 mg total) by mouth 2 (two) times daily with a meal.     dextromethorphan 30 MG/5ML liquid  Commonly known as:  DELSYM  Take 60 mg by mouth daily.     furosemide 40 MG tablet  Commonly known as:  LASIX  Take 1 tablet (40 mg total) by mouth daily.     Potassium Chloride ER 20 MEQ Tbcr  Take 20 mEq by mouth daily.     sildenafil 100 MG  tablet  Commonly known as:  VIAGRA  Take 0.5 tablets (50 mg total) by mouth as needed.     testosterone cypionate 200 MG/ML injection  Commonly known as:  DEPOTESTOTERONE CYPIONATE  Inject 1 mL (200 mg total) into the muscle every 14 (fourteen) days.        Disposition   The patient will be discharged in stable condition to home. Discharge Orders   Future Appointments Provider Department Dept Phone   08/04/2013 9:10 AM [facility] [place] Office [phone]   08/04/2013 9:15 AM Laurey Morale, MD [facility] [place] [phone]   Future Orders Complete By Expires   Diet - low sodium heart healthy  As directed    Discharge instructions  As directed    Comments:     Please monitor your blood pressure occasionally at home. Call your doctor if you tend to get readings of greater than 130 on the top number or 80 on the bottom number.   Increase activity slowly  As directed    Comments:     No driving for 2 days. No lifting over 5 lbs for 1 week. No sexual activity for 1 week. You should wait to be cleared by Dr. [name] before you return to work. Keep procedure site clean & dry. If you notice increased pain, swelling, bleeding or pus, call/return!  You may shower, but no soaking baths/hot tubs/pools for 1 week.     Follow-up Information   Follow up with Marca Ancona, MD. (Dr. Alford Highland office will call you to schedule your cardiac CT scan. Followup with Dr. [name] on 08/04/13 at 9:15am with labwork.)    Specialty:  Cardiology   Contact information:   [address] [place] [address] [phone]       Follow up with Primary Care Provider. (Please follow up with your PCP for evaluation of diabetes mellitus. Your A1C was 6.6 indicating diagnosis of diabetes. This will require further management. )         Duration of Discharge Encounter: Greater than 30 minutes including physician and PA time.  Signed, Kriste Basque Rubye Strohmeyer  PA-C 07/26/2013, 11:45 AM

## 2013-07-27 ENCOUNTER — Encounter: Payer: Self-pay | Admitting: *Deleted

## 2013-07-29 ENCOUNTER — Other Ambulatory Visit: Payer: Self-pay | Admitting: Physician Assistant

## 2013-07-29 ENCOUNTER — Emergency Department (HOSPITAL_COMMUNITY): Payer: BC Managed Care – PPO

## 2013-07-29 ENCOUNTER — Emergency Department (HOSPITAL_COMMUNITY)
Admission: EM | Admit: 2013-07-29 | Discharge: 2013-07-29 | Disposition: A | Discharge status: Home / Self Care | Payer: BC Managed Care – PPO | Attending: Emergency Medicine | Admitting: Emergency Medicine

## 2013-07-29 ENCOUNTER — Encounter (HOSPITAL_COMMUNITY): Payer: Self-pay | Admitting: Emergency Medicine

## 2013-07-29 DIAGNOSIS — I5033 Acute on chronic diastolic (congestive) heart failure: Secondary | ICD-10-CM

## 2013-07-29 DIAGNOSIS — N189 Chronic kidney disease, unspecified: Secondary | ICD-10-CM | POA: Insufficient documentation

## 2013-07-29 DIAGNOSIS — R0600 Dyspnea, unspecified: Secondary | ICD-10-CM

## 2013-07-29 DIAGNOSIS — I517 Cardiomegaly: Secondary | ICD-10-CM

## 2013-07-29 DIAGNOSIS — Z8639 Personal history of other endocrine, nutritional and metabolic disease: Secondary | ICD-10-CM | POA: Insufficient documentation

## 2013-07-29 DIAGNOSIS — Z7982 Long term (current) use of aspirin: Secondary | ICD-10-CM | POA: Insufficient documentation

## 2013-07-29 DIAGNOSIS — I2510 Atherosclerotic heart disease of native coronary artery without angina pectoris: Secondary | ICD-10-CM | POA: Insufficient documentation

## 2013-07-29 DIAGNOSIS — I5030 Unspecified diastolic (congestive) heart failure: Secondary | ICD-10-CM | POA: Insufficient documentation

## 2013-07-29 DIAGNOSIS — I2789 Other specified pulmonary heart diseases: Secondary | ICD-10-CM | POA: Insufficient documentation

## 2013-07-29 DIAGNOSIS — I10 Essential (primary) hypertension: Secondary | ICD-10-CM

## 2013-07-29 DIAGNOSIS — R34 Anuria and oliguria: Secondary | ICD-10-CM | POA: Insufficient documentation

## 2013-07-29 DIAGNOSIS — R0609 Other forms of dyspnea: Secondary | ICD-10-CM | POA: Insufficient documentation

## 2013-07-29 DIAGNOSIS — R0989 Other specified symptoms and signs involving the circulatory and respiratory systems: Secondary | ICD-10-CM | POA: Insufficient documentation

## 2013-07-29 DIAGNOSIS — Q210 Ventricular septal defect: Secondary | ICD-10-CM | POA: Insufficient documentation

## 2013-07-29 DIAGNOSIS — I272 Pulmonary hypertension, unspecified: Secondary | ICD-10-CM

## 2013-07-29 DIAGNOSIS — Z79899 Other long term (current) drug therapy: Secondary | ICD-10-CM | POA: Insufficient documentation

## 2013-07-29 DIAGNOSIS — Z862 Personal history of diseases of the blood and blood-forming organs and certain disorders involving the immune mechanism: Secondary | ICD-10-CM | POA: Insufficient documentation

## 2013-07-29 DIAGNOSIS — I5032 Chronic diastolic (congestive) heart failure: Secondary | ICD-10-CM

## 2013-07-29 LAB — CBC
HCT: 47.1 % (ref 39.0–52.0)
Hemoglobin: 16.1 g/dL (ref 13.0–17.0)
MCH: 30 pg (ref 26.0–34.0)
MCHC: 34.2 g/dL (ref 30.0–36.0)
MCV: 87.9 fL (ref 78.0–100.0)
RDW: 15.6 % — ABNORMAL HIGH (ref 11.5–15.5)

## 2013-07-29 LAB — POCT I-STAT TROPONIN I

## 2013-07-29 LAB — BASIC METABOLIC PANEL
BUN: 25 mg/dL — ABNORMAL HIGH (ref 6–23)
Calcium: 9.9 mg/dL (ref 8.4–10.5)
GFR calc Af Amer: 60 mL/min — ABNORMAL LOW (ref >90)
GFR calc non Af Amer: 51 mL/min — ABNORMAL LOW (ref >90)
Glucose, Bld: 145 mg/dL — ABNORMAL HIGH (ref 70–99)
Potassium: 3.8 mEq/L (ref 3.5–5.1)
Sodium: 138 mEq/L (ref 135–145)

## 2013-07-29 LAB — HEPATIC FUNCTION PANEL
ALT: 36 U/L (ref 0–53)
AST: 20 U/L (ref 0–37)
Albumin: 3.7 g/dL (ref 3.5–5.2)
Total Bilirubin: 0.8 mg/dL (ref 0.3–1.2)
Total Protein: 7.3 g/dL (ref 6.0–8.3)

## 2013-07-29 LAB — PRO B NATRIURETIC PEPTIDE: Pro B Natriuretic peptide (BNP): 1054 pg/mL — ABNORMAL HIGH (ref 0–125)

## 2013-07-29 LAB — TROPONIN I: Troponin I: <0.3 ng/mL (ref <0.30)

## 2013-07-29 LAB — SEDIMENTATION RATE: Sed Rate: 10 mm/hr (ref 0–16)

## 2013-07-29 MED ORDER — POTASSIUM CHLORIDE ER 20 MEQ PO TBCR: 20.0000 meq | EXTENDED_RELEASE_TABLET | Freq: Two times a day (BID) | ORAL | Status: DC

## 2013-07-29 MED ORDER — FUROSEMIDE 40 MG PO TABS: 60.0000 mg | ORAL_TABLET | Freq: Every day | ORAL | Status: DC

## 2013-07-29 NOTE — ED Provider Notes (Signed)
Cardiology evaluated, made medication changes, and follow up plan.  Recommended DC.  Clinical Impression: 1. Dyspnea       Candyce Churn, MD 07/29/13 1159

## 2013-07-29 NOTE — ED Notes (Signed)
Pt reports having cath done this past Monday.  Sts SOB has increased since then.

## 2013-07-29 NOTE — ED Provider Notes (Signed)
CSN: [id number]     Arrival date & time 07/29/13  7829 History   First MD Initiated Contact with Patient 07/29/13 [phone]     Chief Complaint  Patient presents with  . Shortness of Breath   (Consider location/radiation/quality/duration/timing/severity/associated sxs/prior Treatment) HPI 52 yo male presents to the ER from home with complaint of continued shortness of breath and poor urine output despite lasix.  Pt was admitted to hospital 10/17-10/21 with new dx of CHF, found to have VSD, pulm HTN.  Prior h/o hemochromatosis, CKD, DM.  Pt reports he was feeling well in the hospital, but since going home has had increased sob, especially when lying down.  Pt with diffuse constant chest pressure.  Some DOE.  No PND.  He reports he is not urinating as much as he did when he was in the hospital.  Past Medical History  Diagnosis Date  . Refusal of blood transfusions as patient is Jehovah's Witness   . Diastolic CHF     a. Acute diastolic CHF 07/2013 in the setting of perimembranous VSD. Eval underway given mod pulm HTN and RV dilation.  . Borderline diabetic     a. A1C 6.6 in 07/2013 - needs to f/u PCP for likely full DM.  [name] Hemochromatosis   . VSD (ventricular septal defect), perimembranous     a. Dx 07/2013 with significant L-R shunt.  . Pulmonary HTN     a. 07/2013: Moderate pulmonary HTN with RV dilitation.  [name] CAD (coronary artery disease)     a. 07/2013: mild nonobstructive CAD by cath 07/25/13 as part of eval for VSD/SOB.  . CKD (chronic kidney disease)   . RBBB   . LAFB (left anterior fascicular block)    Past Surgical History  Procedure Laterality Date  . No past surgeries    . Tee without cardioversion N/A 07/25/2013    Procedure: TRANSESOPHAGEAL ECHOCARDIOGRAM (TEE);  Surgeon: Lars Masson, MD;  [facility] ENDOSCOPY;  Service: Cardiovascular;  Laterality: N/A;   Family History  Problem Relation Age of Onset  . Cancer Mother   . Hypertension Father    History   Substance Use Topics  . Smoking status: Never Smoker   . Smokeless tobacco: Never Used  . Alcohol Use: No     Comment: socially    Review of Systems  All other systems reviewed and are negative.    Allergies  Watermelon concentrate  Home Medications   Current Outpatient Rx  Name  Route  Sig  Dispense  Refill  . aspirin EC 81 MG EC tablet   Oral   Take 1 tablet (81 mg total) by mouth daily.         . carvedilol (COREG) 6.25 MG tablet   Oral   Take 1 tablet (6.25 mg total) by mouth 2 (two) times daily with a meal.   60 tablet   6   . dextromethorphan (DELSYM) 30 MG/5ML liquid   Oral   Take 60 mg by mouth daily.         . furosemide (LASIX) 40 MG tablet   Oral   Take 1 tablet (40 mg total) by mouth daily.   30 tablet   6   . potassium chloride 20 MEQ TBCR   Oral   Take 20 mEq by mouth daily.   30 tablet   6   . sildenafil (VIAGRA) 100 MG tablet   Oral   Take 0.5 tablets (50 mg total) by mouth as needed.  10 tablet   5   . testosterone cypionate (DEPOTESTOTERONE CYPIONATE) 200 MG/ML injection   Intramuscular   Inject 1 mL (200 mg total) into the muscle every 14 (fourteen) days.   20 mL   4    BP 128/58  Pulse 90  Temp(Src) 97.9 F (36.6 C) (Oral)  Resp 16  SpO2 98% Physical Exam  Nursing note and vitals reviewed. Constitutional: He is oriented to person, place, and time. He appears well-developed and well-nourished.  HENT:  Head: Normocephalic and atraumatic.  Nose: Nose normal.  Mouth/Throat: Oropharynx is clear and moist.  Eyes: Conjunctivae and EOM are normal. Pupils are equal, round, and reactive to light.  Neck: Normal range of motion. Neck supple. No JVD present. No tracheal deviation present. No thyromegaly present.  Cardiovascular: Normal rate, regular rhythm, normal heart sounds and intact distal pulses.  Exam reveals no gallop and no friction rub.   No murmur heard. Pulmonary/Chest: Effort normal. No stridor. No respiratory  distress. He has no wheezes. He has rales. He exhibits no tenderness.  Abdominal: Soft. Bowel sounds are normal. He exhibits no distension and no mass. There is no tenderness. There is no rebound and no guarding.  Musculoskeletal: Normal range of motion. He exhibits no edema and no tenderness.  Lymphadenopathy:    He has no cervical adenopathy.  Neurological: He is alert and oriented to person, place, and time. He exhibits normal muscle tone. Coordination normal.  Skin: Skin is warm and dry. No rash noted. No erythema. No pallor.  Psychiatric: He has a normal mood and affect. His behavior is normal. Judgment and thought content normal.    ED Course  Procedures (including critical care time) Labs Review Labs Reviewed  BASIC METABOLIC PANEL - Abnormal; Notable for the following:    Glucose, Bld 145 (*)    BUN 25 (*)    Creatinine, Ser 1.51 (*)    GFR calc non Af Amer 51 (*)    GFR calc Af Amer 60 (*)    All other components within normal limits  CBC - Abnormal; Notable for the following:    WBC 13.1 (*)    RDW 15.6 (*)    All other components within normal limits  PRO B NATRIURETIC PEPTIDE - Abnormal; Notable for the following:    Pro B Natriuretic peptide (BNP) 1054.0 (*)    All other components within normal limits  TROPONIN I  POCT I-STAT TROPONIN I   Imaging Review Dg Chest 2 View  07/29/2013   CLINICAL DATA:  Chest pressure, cough  EXAM: CHEST  2 VIEW  COMPARISON:  Prior radiograph from 07/22/2013  FINDINGS: Cardiomegaly is stable as compared to the prior examination. Previously seen interstitial edema has largely resolved. No evidence of pulmonary edema now identified. No pleural effusion. No pneumothorax. No focal infiltrate identified to suggest acute infectious pneumonitis. There is mild bibasilar atelectasis, left greater than right.  Osseous structures are unchanged.  IMPRESSION: Stable cardiomegaly without pulmonary edema or focal airspace disease.   Electronically Signed    By: Rise Mu M.D.   On: 07/29/2013 06:14    Date: 07/29/2013  Rate: 90  Rhythm: normal sinus rhythm  QRS Axis: left  Intervals: normal  ST/T Wave abnormalities: normal  Conduction Disutrbances:right bundle branch block and left anterior fascicular block  Narrative Interpretation:   Old EKG Reviewed: unchanged   EKG Interpretation   None       MDM   1. Dyspnea    52 yo  male with dyspnea, recent dx of CHF.  Rales on exam.  Will check cxr, bnp, troponin.  Will d/w cardiology.   7:36 AM D/w Rosann Auerbach, PA with [facility].  Workup here without acute changes.  They will see patient in ED.     Olivia Mackie, MD 07/29/13 [phone]

## 2013-07-29 NOTE — Consult Note (Signed)
Cardiology Consultation  Patient ID: Kevin Wiggins MRN: [id number], DOB: 1960-10-26 Date of Encounter: 07/29/2013, 10:16 AM Primary Physician: [name], MD Primary Cardiologist: [name]  Chief Complaint: SOB Reason for Admission: SOB  HPI: [name] is a 52 y/o M with recent admission for acute diastolic CHF in the setting of small perimembranous VSD but with significant L-R shunt by cath and pulm HTN with RV dilitation. He also has history of HTN, hemochromatosis, RBBB/LAFB. Just last week he was diagnosed with CKD stage II and diabetes mellitus (A1c was 6.6 - had been borderline diabetic before). Initially Dr. [name] wanted to pursue cardiac MRI to assess T2* but the patient was unable to tolerate due to claustrophobia. The patient had TEE/Echo which showed a small VSD, but cath demonstrated moderate pulm HTN and O2 saturations consistent with significant L-R intracardiac shunt. Cath also showed only mild nonobstructive CAD. He was treated with IV Lasix then discharged on 40mg  PO daily. The plan was for an outpatient cardiac-gated coronary CTA for pulmonary vein evaluation next week given CKD/cath. The office had scheduled this for 08/03/13 with subsequent followup with Dr. [name]. (Dr. [name] had mentioned that the adult cardiologists at [facility] do not do percutaneous VSD closure thus he would check at [facility].)   The patient returned to the ER today with dyspnea. He has been compliant with meds since discharge. He ate [place] food last night (steamed broccoli and chicken) and told them not to add salt or MSG, but wonders in retrospect if these were added because it was "way too good." Around 2-3 in the AM he developed dyspnea, orthopnea, chest pressure, and restlessness. He also has had intermittent shooting pains across his R chest. Nothing seemed to make these symptoms worse. He ended up taking an extra Lasix. Initially this did not help but just a short while ago he begin to urinate more and feels  somewhat better. He was able to lie completely flat after this. He has had a persistent occasionally clear-phlegm cough since the first admission that never fully resolved. No hemoptysis, palpitations, syncope, LEE, fever, or chills. In the ER, troponin neg x 2, pBNP 1054, WBC 13.1 (was 12.1 at dc). CXR: stable cardiomegaly without pulmonary edema or focal airspace disease. VSS.  Past Medical History  Diagnosis Date  . Refusal of blood transfusions as patient is Jehovah's Witness   . Diastolic CHF     a. Acute diastolic CHF 07/2013 in the setting of perimembranous VSD. Eval underway given mod pulm HTN and RV dilation.  . Borderline diabetic     a. A1C 6.6 in 07/2013 - needs to f/u PCP for likely full DM.  [name] Hemochromatosis   . VSD (ventricular septal defect), perimembranous     a. Dx 07/2013 with significant L-R shunt.  . Pulmonary HTN     a. 07/2013: Moderate pulmonary HTN with RV dilitation.  [name] CAD (coronary artery disease)     a. 07/2013: mild nonobstructive CAD by cath 07/25/13 as part of eval for VSD/SOB.  . CKD (chronic kidney disease)   . RBBB   . LAFB (left anterior fascicular block)   . Hypogonadism male      Most Recent Cardiac Studies: TEE 07/25/13 - Left ventricle: There is a small restrictive perimembranous ventricular septal defect. The peak measured velocity is 4.75 m/sec, peak gradient 90 mmHg. The cavity size was normal. Wall thickness was normal. Systolic function was normal. The estimated ejection fraction was in the range of 55% to 60%. -  Aortic valve: No evidence of vegetation. Trivial regurgitation. - Mitral valve: No evidence of vegetation. - Right ventricle: The cavity size was moderately dilated. Systolic function was mildly reduced. - Right atrium: No evidence of thrombus in the atrial cavity or appendage. - Atrial septum: No defect or patent foramen ovale was identified. - Tricuspid valve: No evidence of vegetation. - Pulmonic valve: No evidence of  vegetation.  2D Echo 07/22/13 - Left ventricle: The cavity size was normal. Wall thickness was normal. Systolic function was normal. The estimated ejection fraction was in the range of 60% to 65%. Wall motion was normal; there were no regional wall motion abnormalities. Doppler parameters are consistent with abnormal left ventricular relaxation (grade 1 diastolic dysfunction). - Ventricular septum: There appears to be a small perimembranous VSD. D-shaped interventricular septum in diastole suggests RV volume overload. - Aortic valve: There was no stenosis. - Mitral valve: No significant regurgitation. - Left atrium: The atrium was mildly dilated. - Right ventricle: The cavity size was moderately dilated. Systolic function was normal. - Pulmonary arteries: No complete TR doppler jet so unable to estimate PA systolic pressure. - Systemic veins: IVC measured 2.3 cm with some respirophasic variation, suggesting RA pressure 10 mmHg. Impressions: - Normal LV size and systolic function, EF 60-65%. Moderately dilated RV with normal systolic function. There appears to be a small VSD. The VSD does not appear to be large enough to have caused the RV dilation. Will need further evaluation.  Cardiac Cath 07/25/13 Procedure: Right Heart Cath, Left Heart Cath, Selective Coronary Angiography, LV angiography  Indication: CHF, VSD  Procedural Details: The right groin was prepped, draped, and anesthetized with 1% lidocaine. Using the modified Seldinger technique a 5 French sheath was placed in the right femoral artery and a 7 French sheath was placed in the right femoral vein. A Swan-Ganz catheter was used for the right heart catheterization. Standard protocol was followed for recording of right heart pressures and sampling of oxygen saturations. Fick cardiac output was calculated. Standard Judkins catheters were used for selective coronary angiography and left ventriculography. There were no immediate  procedural complications. The patient was transferred to the post catheterization recovery area for further monitoring.  Procedural Findings:  Hemodynamics  RA 13/9 mean 6  RV 60/13  PA 56/22 mean 35  PCWP mean 18  LV 103/21  AO 106/60 mean 82  Oxygen saturations:  PA 82  AO 97  SVC 59  IVC 67  Cardiac Output (Fick) 4.7  Cardiac Index (Fick) 2.1  QP:QS = 2.4  Coronary angiography:  Coronary dominance: right  Left mainstem: Widely patent, no obstructive disease.  Left anterior descending (LAD): Normal in caliber, reaches the LV apex. There is minor diffuse irregularity without significant stenosis. The diagonal branches are normal in caliber.  Left circumflex (LCx): Patent throughout. The first OM is patent without obstructive disease. The PLA branches are patent.  Right coronary artery (RCA): Dominant vessel, normal in caliber, arises from right cusp. There is minor 30% stenosis at the origin of the RCA. Otherwise there is no significant disease. The acute marginal branch is patent and the PDA is small in caliber.  Left ventriculography: deferred  Final Conclusions:  1. Patent coronary arteries with mild nonobstructive CAD  2. Moderate pulmonary HTN  3. O2 saturations consistent with significant left-right intracardiac shunt    Surgical History:  Past Surgical History  Procedure Laterality Date  . No past surgeries    . Tee without cardioversion N/A 07/25/2013  Procedure: TRANSESOPHAGEAL ECHOCARDIOGRAM (TEE);  Surgeon: Lars Masson, MD;  Location: [facility] ENDOSCOPY;  Service: Cardiovascular;  Laterality: N/A;     Home Meds: Prior to Admission medications   Medication Sig Start Date End Date Taking? Authorizing Provider  aspirin EC 81 MG EC tablet Take 1 tablet (81 mg total) by mouth daily. 07/26/13  Yes Dayna N Dunn, PA-C  carvedilol (COREG) 6.25 MG tablet Take 1 tablet (6.25 mg total) by mouth 2 (two) times daily with a meal. 07/26/13  Yes Dayna N Dunn, PA-C    dextromethorphan (DELSYM) 30 MG/5ML liquid Take 60 mg by mouth daily.   Yes Historical Provider, MD  furosemide (LASIX) 40 MG tablet Take 1 tablet (40 mg total) by mouth daily. 07/26/13  Yes Dayna N Dunn, PA-C  potassium chloride 20 MEQ TBCR Take 20 mEq by mouth daily. 07/26/13  Yes Dayna N Dunn, PA-C  sildenafil (VIAGRA) 100 MG tablet Take 0.5 tablets (50 mg total) by mouth as needed. 04/06/13  Yes [name], MD  testosterone cypionate (DEPOTESTOTERONE CYPIONATE) 200 MG/ML injection Inject 1 mL (200 mg total) into the muscle every 14 (fourteen) days. 05/23/13  Yes [name], MD    Allergies:  Allergies  Allergen Reactions  . Watermelon Concentrate Swelling    History   Social History  . Marital Status: Divorced    Spouse Name: N/A    Number of Children: N/A  . Years of Education: N/A   Occupational History  .      Maintenance    Social History Main Topics  . Smoking status: Never Smoker   . Smokeless tobacco: Never Used  . Alcohol Use: No     Comment: socially  . Drug Use: No  . Sexual Activity: Not on file   Other Topics Concern  . Not on file   Social History Narrative  . No narrative on file     Family History  Problem Relation Age of Onset  . Cancer Mother   . Hypertension Father     Review of Systems: General: negative for chills, fever. Pt does not know if he had weight changes. Cardiovascular: see above Dermatological: negative for rash Respiratory: see above Urologic: negative for hematuria Abdominal: negative for nausea, vomiting, diarrhea, bright red blood per rectum, melena, or hematemesis Neurologic: negative for visual changes, syncope, or dizziness All other systems reviewed and are otherwise negative except as noted above.  Labs:   Lab Results  Component Value Date   WBC 13.1* 07/29/2013   HGB 16.1 07/29/2013   HCT 47.1 07/29/2013   MCV 87.9 07/29/2013   PLT 313 07/29/2013     Recent Labs Lab 07/29/13 0545  NA 138  K  3.8  CL 99  CO2 26  BUN 25*  CREATININE 1.51*  CALCIUM 9.9  GLUCOSE 145*    Recent Labs  07/29/13 0545  TROPONINI <0.30   Lab Results  Component Value Date   CHOL 204* 07/22/2013   HDL 41 07/22/2013   LDLCALC 141* 07/22/2013   TRIG 109 07/22/2013   Radiology/Studies:  Dg Chest 2 View 07/29/2013   CLINICAL DATA:  Chest pressure, cough  EXAM: CHEST  2 VIEW  COMPARISON:  Prior radiograph from 07/22/2013  FINDINGS: Cardiomegaly is stable as compared to the prior examination. Previously seen interstitial edema has largely resolved. No evidence of pulmonary edema now identified. No pleural effusion. No pneumothorax. No focal infiltrate identified to suggest acute infectious pneumonitis. There is mild bibasilar atelectasis, left greater  than right.  Osseous structures are unchanged.  IMPRESSION: Stable cardiomegaly without pulmonary edema or focal airspace disease.   Electronically Signed   By: Rise Mu M.D.   On: 07/29/2013 06:14   US Renal 07/22/2013   CLINICAL DATA:  Acute versus chronic kidney disease.  EXAM: RENAL/URINARY TRACT ULTRASOUND COMPLETE  COMPARISON:  Abdominal ultrasound 09/30/2005.  FINDINGS: Right Kidney  Length: 13.0 cm in length. Two anechoic lesions with increased through transmission measuring 2.9 x 2.5 x 2.8 cm in the upper pole and 3.0 x 2.5 x 2.7 cm in the lower pole, compatible with simple cysts. Echogenicity within normal limits. No hydronephrosis visualized.  Left Kidney  Length: 12.2 cm in length. Two anechoic lesions with increased through transmission are noted in the interpolar region and lower pole measuring 3.2 x 3.0 x 2.9 cm and 2.3 x 2.2 x 2.2 cm respectively, compatible with simple cysts. Echogenicity within normal limits. No hydronephrosis visualized.  Bladder  Appears normal for degree of bladder distention.  IMPRESSION: 1. No hydronephrosis or other acute abnormality. 2. Multiple small simple cysts in the kidneys bilaterally, as above.    Electronically Signed   By: Trudie Reed M.D.   On: 07/22/2013 13:30    EKG: NSR 90bpm, RBBB, LAFB, nonspecific ST-T changes  Physical Exam: Blood pressure 113/65, pulse 88, temperature 97.9 F (36.6 C), temperature source Oral, resp. rate 25, weight 261 lb 0.4 oz (118.4 kg), SpO2 95.00%. General: Morbidly obese and in no acute distress. Head: Normocephalic, atraumatic, sclera non-icteric, no xanthomas, nares are without discharge.  Neck: Negative for carotid bruits. JVD elevated at 9 cm   Lungs: Clear bilaterally to auscultation without wheezes, rales, or rhonchi. Breathing is unlabored. Heart: RRR with S1 S2.2/6 SEM along the sternal border. +S3. No rubs or gallops appreciated. Abdomen: Soft, non-tender, non-distended with normoactive bowel sounds. No hepatomegaly. No rebound/guarding. No obvious abdominal masses. Msk:  Strength and tone appear normal for age. Extremities: No clubbing or cyanosis. No edema.  Distal pedal pulses are 2+ and equal bilaterally. Neuro: Alert and oriented X 3. No focal deficit. No facial asymmetry. Moves all extremities spontaneously. Psych:  Responds to questions appropriately with a normal affect.   ASSESSMENT AND PLAN:  1. Dyspnea likely due to acute diastolic CHF in the setting of perimembranous VSD  2. Perimembranous VSD with significant L-R shunt  3. Moderate pulmonary HTN with RV dilation  4. Recent dx of CKD stage III 5. Hemochromatosis 6. Diabetes mellitus (recent dx) 7. HTN 8. RBBB/LAFB 9. Leukocytosis, may be reactive to the above  Dr. Tenny Craw discussed the case with Dr. [name]. The patient is currently feeling better and diuresed after his self-administered extra lasix. Will DC home from ER and increase Lasix to 60mg  daily and KCl BID. Will send off hepatic function panel and ESR in ER (does not have to wait for result). Recheck BMET/BNP on Monday 08/01/13. Keep appointment for CT on 08/03/13 and appointment with [facility] on 08/04/13. We  have also arranged sleep study for 08/24/13 at 8pm (sleep center will send information). Pt agreeable with plan.  Signed, Ronie Spies PA-C 07/29/2013, 10:16 AM  Patient seen and examined  Agree with findings of D Dunn.  I have amended note to reflect additional findings.   He is breathing better now that lasix has started working  I think above is from food indiscretion yesterday. Recomm increase daily lasix dose as noted  Labs Monday  CT next wk. Check ESR as WBC is increased.  Dorris Carnes

## 2013-07-29 NOTE — ED Notes (Signed)
Pt was in last Friday 07/22/13 for similar problems, SOB, MD sent Pt home with lasix pill, Pt c/o that lasix not working, no resulting in urine increase

## 2013-08-01 ENCOUNTER — Ambulatory Visit (INDEPENDENT_AMBULATORY_CARE_PROVIDER_SITE_OTHER): Payer: BC Managed Care – PPO | Admitting: Cardiology

## 2013-08-01 DIAGNOSIS — I5032 Chronic diastolic (congestive) heart failure: Secondary | ICD-10-CM

## 2013-08-01 DIAGNOSIS — I509 Heart failure, unspecified: Secondary | ICD-10-CM

## 2013-08-01 LAB — BASIC METABOLIC PANEL
BUN: 20 mg/dL (ref 6–23)
CO2: 26 mEq/L (ref 19–32)
Chloride: 100 mEq/L (ref 96–112)
Creatinine, Ser: 1.7 mg/dL — ABNORMAL HIGH (ref 0.4–1.5)
Glucose, Bld: 132 mg/dL — ABNORMAL HIGH (ref 70–99)
Potassium: 4.4 mEq/L (ref 3.5–5.1)
Sodium: 138 mEq/L (ref 135–145)

## 2013-08-03 ENCOUNTER — Ambulatory Visit (HOSPITAL_COMMUNITY)
Admission: RE | Admit: 2013-08-03 | Discharge: 2013-08-03 | Disposition: A | Discharge status: Home / Self Care | Payer: BC Managed Care – PPO | Source: Ambulatory Visit | Attending: Physician Assistant | Admitting: Physician Assistant

## 2013-08-03 ENCOUNTER — Encounter: Payer: Self-pay | Admitting: Physician Assistant

## 2013-08-03 ENCOUNTER — Other Ambulatory Visit: Payer: Self-pay | Admitting: Physician Assistant

## 2013-08-03 VITALS — BP 107/60 | HR 73 | Temp 98.3°F | Resp 21 | Ht 70.0 in | Wt 252.0 lb

## 2013-08-03 DIAGNOSIS — I2510 Atherosclerotic heart disease of native coronary artery without angina pectoris: Secondary | ICD-10-CM | POA: Insufficient documentation

## 2013-08-03 DIAGNOSIS — Q210 Ventricular septal defect: Secondary | ICD-10-CM | POA: Insufficient documentation

## 2013-08-03 DIAGNOSIS — N189 Chronic kidney disease, unspecified: Secondary | ICD-10-CM

## 2013-08-03 DIAGNOSIS — J811 Chronic pulmonary edema: Secondary | ICD-10-CM | POA: Insufficient documentation

## 2013-08-03 LAB — GLUCOSE, CAPILLARY: Glucose-Capillary: 221 mg/dL — ABNORMAL HIGH (ref 70–99)

## 2013-08-03 MED ORDER — METOPROLOL TARTRATE 1 MG/ML IV SOLN
5.0000 mg | INTRAVENOUS | Status: DC | PRN
  Administered 2013-08-03 (×2): 5 mg via INTRAVENOUS
  Filled 2013-08-03: qty 5

## 2013-08-03 MED ORDER — METOPROLOL TARTRATE 1 MG/ML IV SOLN
INTRAVENOUS | Status: AC
  Administered 2013-08-03: 5 mg via INTRAVENOUS
  Filled 2013-08-03: qty 15

## 2013-08-03 MED ORDER — SODIUM CHLORIDE 0.9 % IV SOLN
INTRAVENOUS | Status: AC
  Administered 2013-08-03: 10:00:00 via INTRAVENOUS

## 2013-08-03 MED ORDER — IOHEXOL 350 MG/ML SOLN
100.0000 mL | Freq: Once | INTRAVENOUS | Status: AC | PRN
  Administered 2013-08-03: 80 mL via INTRAVENOUS

## 2013-08-03 MED ORDER — NITROGLYCERIN 0.4 MG SL SUBL
SUBLINGUAL_TABLET | SUBLINGUAL | Status: AC
  Filled 2013-08-03: qty 25

## 2013-08-03 NOTE — Progress Notes (Signed)
Dr. [name] called me regarding pt's Cr earlier this AM - 1.7, up slightly from baseline. I called the patient at 8:30am to instruct not to take Lasix but he had already taken it for the day - Dr. [name] aware. We arranged for the pt to come into short stay earlier than scheduled - 10am for 3 hours of IV fluids at 125cc/hr. Pt was made aware as well as admitting/short stay. Gwendolyn Mclees PA-C

## 2013-08-04 ENCOUNTER — Other Ambulatory Visit: Payer: BC Managed Care – PPO

## 2013-08-04 ENCOUNTER — Ambulatory Visit (INDEPENDENT_AMBULATORY_CARE_PROVIDER_SITE_OTHER): Payer: BC Managed Care – PPO | Admitting: Cardiology

## 2013-08-04 ENCOUNTER — Encounter: Payer: Self-pay | Admitting: *Deleted

## 2013-08-04 VITALS — Ht 70.0 in | Wt 259.0 lb

## 2013-08-04 DIAGNOSIS — I5032 Chronic diastolic (congestive) heart failure: Secondary | ICD-10-CM | POA: Insufficient documentation

## 2013-08-04 DIAGNOSIS — I509 Heart failure, unspecified: Secondary | ICD-10-CM

## 2013-08-04 DIAGNOSIS — Q210 Ventricular septal defect: Secondary | ICD-10-CM

## 2013-08-04 DIAGNOSIS — G4733 Obstructive sleep apnea (adult) (pediatric): Secondary | ICD-10-CM

## 2013-08-04 LAB — GLUCOSE, CAPILLARY: Glucose-Capillary: 105 mg/dL — ABNORMAL HIGH (ref 70–99)

## 2013-08-04 MED ORDER — FUROSEMIDE 40 MG PO TABS: 60.0000 mg | ORAL_TABLET | Freq: Every day | ORAL | Status: DC

## 2013-08-04 NOTE — Patient Instructions (Addendum)
Take lasix (furosmide) 60mg  daily. This will be 1 and 1/2 of a 40mg  tablet daily.  Your physician recommends that you return for lab work in: 1 week--BMET.   Your physician has recommended that you have a sleep study. This test records several body functions during sleep, including: brain activity, eye movement, oxygen and carbon dioxide blood levels, heart rate and rhythm, breathing rate and rhythm, the flow of air through your mouth and nose, snoring, body muscle movements, and chest and belly movement.   You have been referred to Dr Theodis Sato for evaluation for ASD closure.     Your physician recommends that you schedule a follow-up appointment in 1 month with Dr [name]. This is scheduled for Monday December 1,2014 at 9:45AM.

## 2013-08-04 NOTE — Progress Notes (Signed)
Patient ID: Kevin Wiggins, male   DOB: 1961/04/10, 52 y.o.   MRN: [id number] 52 yo with history of hereditary hemochromatosis (closely followed by hematology with phlebotomies) and HTN presented to [facility] in 10/14 with symptoms consistent with CHF.  Dyspnea developed in early 10/14.  Prior to this, he had had no exertional symptoms.  In the hospital, he was noted to be volume overloaded and was diuresed.  He had a loud systolic murmur.  TTE showed normal LV systolic function with a moderately dilated RV.  He had what appeared to be a small peri-membranous VSD.  TEE confirmed the VSD.  He had a RHC/LHC done next given CHF.  This showed nonobstructive CAD and moderate pulmonary hypertension.  Qp/Qs was 2.4/1, which was surprising.  We attempted to do a cardiac MRI/MRA but were unable to do so due to claustrophobia.  Therefore, he had a coronary CTA, which showed no pulmonary vein anomalies.  The VSD actually appeared larger on CT than on echo.   Patient was diuresed and sent home.  Currently, he breathing is "ok."  He is still short of breath after walking 100 yards and needs to stop but he is able to climb a flight of steps without much problem.  No orthopnea or PND.  No chest pain.  He actually ended up in the ER earlier this week after eating [place] food (likely from the sodium load). Finally, he does snore loudly and stops breathing at times in his sleep per his wife.   Labs (10/14): K 4.4, creatinine 1.7, BNP 1054 => 274, LDL 141  PMH: 1. Hemochromatosis: Followed by hematology, gets periodic phlebotomies, has been controlled.  2. HTN 3. CKD 4. Diastolic CHF 5. Perimembranous VSD: Echo (10/14) with EF 60-65%, moderate RV dilation/normal function, small-appearing VSD (left to right flow).  TEE (10/14) with EF 55-60%, moderately dilated RV, perimembranous VSD (appeared small, left to right flow), trivial AI.  LHC/RHC (10/14) with nonobstructive mild CAD; mean RA 6, PA 56/22 mean 35, mean PCWP 18, PA 82%,  IVC 67%, SVG 59%, CI 2.1, Qp/Qs 2.4/1.  He was unable to tolerate cardiac MRI/MRA due to claustrophobia.  Coronary CTA (10/14): Nonobstructive mild coronary disease, 1.2 cm perimembranous VSD, no pulmonary vein anomaly, no sinus venosus ASD.   SH: Married, lives in [place], [place], nonsmoker.   FH: Uncle with CHF  ROS: All systems reviewed and negative except as per HPI.   Current Outpatient Prescriptions  Medication Sig Dispense Refill  . aspirin EC 81 MG EC tablet Take 1 tablet (81 mg total) by mouth daily.      . carvedilol (COREG) 6.25 MG tablet Take 1 tablet (6.25 mg total) by mouth 2 (two) times daily with a meal.  60 tablet  6  . dextromethorphan (DELSYM) 30 MG/5ML liquid Take 60 mg by mouth daily.      . furosemide (LASIX) 40 MG tablet Take 1.5 tablets (60 mg total) by mouth daily.  45 tablet  6  . Potassium Chloride ER 20 MEQ TBCR Take 20 mEq by mouth 2 (two) times daily.      . sildenafil (VIAGRA) 100 MG tablet Take 0.5 tablets (50 mg total) by mouth as needed.  10 tablet  5  . testosterone cypionate (DEPOTESTOTERONE CYPIONATE) 200 MG/ML injection Inject 1 mL (200 mg total) into the muscle every 14 (fourteen) days.  20 mL  4   No current facility-administered medications for this visit.    Ht 5\' 10"  (1.778 m)  Wt 117.482 kg (259 lb)  BMI 37.16 kg/m2 General: NAD, obese Neck: Thick, JVP 8-9 cm, no thyromegaly or thyroid nodule.  Lungs: Clear to auscultation bilaterally with normal respiratory effort. CV: Nondisplaced PMI.  Heart regular S1/S2, no S3/S4, 3/6 systolic murmur along the sternal border.  Trace ankle edema.  No carotid bruit.  Normal pedal pulses.  Abdomen: Soft, nontender, no hepatosplenomegaly, no distention.  Skin: Intact without lesions or rashes.  Neurologic: Alert and oriented x 3.  Psych: Normal affect. Extremities: No clubbing or cyanosis.   Assessment/Plan: 1. VSD: Perimembranous with left to right shunt.  This appeared small on echo  and LV is not dilated.  However, he was admitted with significant CHF.  RHC showed Qp/Qs 2.4/1 with moderate pulmonary HTN.  This suggested a more significant shunt than I had expected based on echo.  He was unable to tolerate an MRI.  Therefore, we did a cardiac CTA, which showed no pulmonary vein anomalies.  There also was no sinus venosus ASD.  The VSD actually seemed to look a larger on CT, 1.2 cm diameter.  Interestingly, the RV was moderately dilated, which would not be typical for VSD.  From the available evidence, it appears to me that the patient has a symptomatic VSD that should likely be closed.  I am going to refer him to the congenital program at [facility].  I wonder whether the VSD could be closed percutaneously.  2. Chronic diastolic CHF: Suspect this is related to LV volume load from VSD.  Patient does appear mildly volume overloaded today.  I will increase lasix to 60 mg daily with BMET in 1 week.  3. OSA: I strongly suspect OSA.  This could play a role in the pulmonary HTN and RV dilation.  I am going to arrange a sleep study.  4. Hemochromatosis: Iron indices have been well-controlled.  He is followed by hematology.  He does not have a dilated cardiomyopathy.  He was unable to tolerate MRI to assess for myocardial hemochromatosis involvement.   Followup with me in 1 month.   Marca Ancona 08/04/2013

## 2013-08-05 ENCOUNTER — Encounter: Payer: Self-pay | Admitting: Cardiology

## 2013-08-05 DIAGNOSIS — G4733 Obstructive sleep apnea (adult) (pediatric): Secondary | ICD-10-CM | POA: Insufficient documentation

## 2013-08-10 DIAGNOSIS — Q210 Ventricular septal defect: Secondary | ICD-10-CM | POA: Insufficient documentation

## 2013-08-10 DIAGNOSIS — I509 Heart failure, unspecified: Secondary | ICD-10-CM | POA: Insufficient documentation

## 2013-08-11 ENCOUNTER — Other Ambulatory Visit (INDEPENDENT_AMBULATORY_CARE_PROVIDER_SITE_OTHER): Payer: BC Managed Care – PPO

## 2013-08-11 DIAGNOSIS — I5032 Chronic diastolic (congestive) heart failure: Secondary | ICD-10-CM

## 2013-08-11 LAB — BASIC METABOLIC PANEL
BUN: 23 mg/dL (ref 6–23)
Chloride: 103 mEq/L (ref 96–112)
GFR: 47.62 mL/min — ABNORMAL LOW (ref >60.00)
Glucose, Bld: 153 mg/dL — ABNORMAL HIGH (ref 70–99)
Potassium: 4.3 mEq/L (ref 3.5–5.1)
Sodium: 137 mEq/L (ref 135–145)

## 2013-08-17 ENCOUNTER — Telehealth: Payer: Self-pay | Admitting: *Deleted

## 2013-08-17 NOTE — Telephone Encounter (Signed)
Pt called on 08/16/13 stating he was due to have open heart surgery on 08/26/13 for a "blown out heart murmur at [facility]". The team there wanted him to check to see if anything needed to be done prior to surgery in regards to his hemochromatosis. Reviewed the labs done on 07/30/15 with Dr Myna Hidalgo. His Hgb was 16.1. Last Ferritin (27 ng/ml) was checked on 05/23/13. Dr Myna Hidalgo stated nothing needed to be done prior to surgery as his Ferritin levels have not been an issue for a while not. Pt made aware of this on 08/17/13 and will relay this to his surgical team. He understands if something in writing is needed, it can be provided.

## 2013-08-24 ENCOUNTER — Ambulatory Visit (HOSPITAL_BASED_OUTPATIENT_CLINIC_OR_DEPARTMENT_OTHER): Payer: BC Managed Care – PPO | Attending: Hematology & Oncology

## 2013-08-25 DIAGNOSIS — Z5320 Procedure and treatment not carried out because of patient's decision for unspecified reasons: Secondary | ICD-10-CM | POA: Insufficient documentation

## 2013-08-25 DIAGNOSIS — Z531 Procedure and treatment not carried out because of patient's decision for reasons of belief and group pressure: Secondary | ICD-10-CM | POA: Insufficient documentation

## 2013-08-26 DIAGNOSIS — R52 Pain, unspecified: Secondary | ICD-10-CM | POA: Insufficient documentation

## 2013-08-26 DIAGNOSIS — Q2543 Congenital aneurysm of aorta: Secondary | ICD-10-CM | POA: Insufficient documentation

## 2013-08-31 ENCOUNTER — Telehealth: Payer: Self-pay | Admitting: Physician Assistant

## 2013-08-31 DIAGNOSIS — E119 Type 2 diabetes mellitus without complications: Secondary | ICD-10-CM | POA: Insufficient documentation

## 2013-08-31 NOTE — Telephone Encounter (Signed)
Received medical records from [facility]  P: [phone]

## 2013-09-05 ENCOUNTER — Ambulatory Visit: Payer: BC Managed Care – PPO | Admitting: Cardiology

## 2013-09-09 ENCOUNTER — Ambulatory Visit: Payer: BC Managed Care – PPO | Admitting: Physician Assistant

## 2013-10-04 ENCOUNTER — Encounter: Payer: Self-pay | Admitting: *Deleted

## 2013-10-04 ENCOUNTER — Other Ambulatory Visit: Payer: Self-pay | Admitting: *Deleted

## 2013-10-04 ENCOUNTER — Telehealth: Payer: Self-pay | Admitting: Cardiology

## 2013-10-04 NOTE — Telephone Encounter (Signed)
Advised pt to contact [facility], who performed OHS. I explained that they would be the one who could release him back to work since they performed surgery. Pt agreeable to plan and will call back if he has issues getting work release.

## 2013-10-04 NOTE — Telephone Encounter (Signed)
New Problem:  Kevin Wiggins states he needs a note from Dr. [name] stating it is ok for him to return to work on Jan 5. Kevin Wiggins states he doesn't know if he needs an appt or if Dr. [name] can write/type a note for him. Kevin Wiggins is requesting a call back

## 2013-10-12 ENCOUNTER — Encounter: Payer: Self-pay | Admitting: Cardiology

## 2013-10-12 ENCOUNTER — Encounter: Payer: Self-pay | Admitting: *Deleted

## 2013-10-12 ENCOUNTER — Ambulatory Visit (INDEPENDENT_AMBULATORY_CARE_PROVIDER_SITE_OTHER): Payer: BC Managed Care – PPO | Admitting: Cardiology

## 2013-10-12 VITALS — BP 124/74 | HR 88 | Ht 70.0 in | Wt 261.0 lb

## 2013-10-12 DIAGNOSIS — G4733 Obstructive sleep apnea (adult) (pediatric): Secondary | ICD-10-CM

## 2013-10-12 DIAGNOSIS — I5032 Chronic diastolic (congestive) heart failure: Secondary | ICD-10-CM

## 2013-10-12 DIAGNOSIS — Q2543 Congenital aneurysm of aorta: Secondary | ICD-10-CM | POA: Insufficient documentation

## 2013-10-12 DIAGNOSIS — I509 Heart failure, unspecified: Secondary | ICD-10-CM

## 2013-10-12 DIAGNOSIS — Q2549 Other congenital malformations of aorta: Secondary | ICD-10-CM

## 2013-10-12 DIAGNOSIS — Q254 Other congenital malformations of aorta: Secondary | ICD-10-CM

## 2013-10-12 LAB — BASIC METABOLIC PANEL
BUN: 10 mg/dL (ref 6–23)
CHLORIDE: 100 meq/L (ref 96–112)
CO2: 31 meq/L (ref 19–32)
Calcium: 9.1 mg/dL (ref 8.4–10.5)
Creatinine, Ser: 1.1 mg/dL (ref 0.4–1.5)
GFR: 72.86 mL/min (ref >60.00)
Glucose, Bld: 129 mg/dL — ABNORMAL HIGH (ref 70–99)
POTASSIUM: 4 meq/L (ref 3.5–5.1)
Sodium: 139 mEq/L (ref 135–145)

## 2013-10-12 NOTE — Patient Instructions (Signed)
Stop lasix (furosemide).  Your physician recommends that you have  lab work today--BMET.  Your physician has requested that you have an echocardiogram. Echocardiography is a painless test that uses sound waves to create images of your heart. It provides your doctor with information about the size and shape of your heart and how well your heart's chambers and valves are working. This procedure takes approximately one hour. There are no restrictions for this procedure.  Your physician has recommended that you have a sleep study. This test records several body functions during sleep, including: brain activity, eye movement, oxygen and carbon dioxide blood levels, heart rate and rhythm, breathing rate and rhythm, the flow of air through your mouth and nose, snoring, body muscle movements, and chest and belly movement.  Your physician wants you to follow-up in: 6 months with Dr Aundra Dubin. (July 2015). You will receive a reminder letter in the mail two months in advance. If you don't receive a letter, please call our office to schedule the follow-up appointment.

## 2013-10-12 NOTE — Progress Notes (Signed)
Patient ID: Kevin Wiggins, male   DOB: [birth year], 53 y.o.   MRN: [id number] PCP: Dr. Hassell Done  53 yo with history of hereditary hemochromatosis (closely followed by hematology with phlebotomies) and HTN presented to [facility] in 10/14 with symptoms consistent with CHF.  Dyspnea developed in early 10/14.  Prior to this, he had had no exertional symptoms.  In the hospital, he was noted to be volume overloaded and was diuresed.  He had a loud systolic murmur.  TTE showed normal LV systolic function with a moderately dilated RV.  He had what appeared to be a small peri-membranous VSD.  TEE showed that this actually was probably a ruptured right sinus of valsalva aneurysm.  He had a RHC/LHC done next given CHF.  This showed nonobstructive CAD and moderate pulmonary hypertension.  Qp/Qs was 2.4/1.  He had a coronary CTA, which showed no pulmonary vein anomalies. Patient was referred to Dr Darcus Austin for congenital cardiology evaluation.  He had patch repair of the ruptured SOV aneurysm in 11/14 at [facility].  Post-op course was uncomplicated.  Kevin Wiggins has done quite well since his surgery.  He is back at work.  He is now taking only 10 mg Lasix daily.  Breathing is much better, no particular DOE now.  He can get up a flight of steps with no problems.    Labs (10/14): K 4.4, creatinine 1.7, BNP 1054 => 274, LDL 141  PMH: 1. Hemochromatosis: Followed by hematology, gets periodic phlebotomies, has been controlled.  2. HTN 3. CKD 4. Diastolic CHF 5. Ruptured sinus of valsalva aneurysm: Echo (10/14) with EF 60-65%, moderate RV dilation/normal function, small-appearing ?VSD (left to right flow).  TEE (10/14) with EF 55-60%, moderately dilated RV, suspect ruptured sinus of valsalva aneurysm, trivial AI.  LHC/RHC (10/14) with nonobstructive mild CAD; mean RA 6, PA 56/22 mean 35, mean PCWP 18, PA 82%, IVC 67%, SVG 59%, CI 2.1, Qp/Qs 2.4/1.  He was unable to tolerate cardiac MRI/MRA due to claustrophobia.  Coronary CTA (10/14):  Nonobstructive mild coronary disease, no pulmonary vein anomaly, enlarged right sinus of valsalva.  Patch repair of ruptured SOV aneurysm in 11/14 at [facility].  6. CAD: Nonobstructive on 10/14 LHC.  Coronary CTA with calcium score 21.9 suggesting intermediate risk (61st percentile).   SH: Single, lives in [place], [place], nonsmoker.   FH: Uncle with CHF  ROS: All systems reviewed and negative except as per HPI.   Current Outpatient Prescriptions  Medication Sig Dispense Refill  . amLODipine (NORVASC) 10 MG tablet Take 1 tablet by mouth daily.      [name] aspirin EC 81 MG EC tablet Take 1 tablet (81 mg total) by mouth daily.      . carvedilol (COREG) 6.25 MG tablet Take 1 tablet (6.25 mg total) by mouth 2 (two) times daily with a meal.  60 tablet  6  . dextromethorphan (DELSYM) 30 MG/5ML liquid Take 60 mg by mouth daily.      . sildenafil (VIAGRA) 100 MG tablet Take 0.5 tablets (50 mg total) by mouth as needed.  10 tablet  5  . sitaGLIPtin (JANUVIA) 100 MG tablet Take by mouth. AS DIRECTED      . testosterone cypionate (DEPOTESTOTERONE CYPIONATE) 200 MG/ML injection Inject 1 mL (200 mg total) into the muscle every 14 (fourteen) days.  20 mL  4   No current facility-administered medications for this visit.    BP 124/74  Pulse 88  Ht 5\' 10"  (1.778 m)  Wt  118.389 kg (261 lb)  BMI 37.45 kg/m2 General: NAD, obese Neck: Thick, JVP 7 cm, no thyromegaly or thyroid nodule.  Lungs: Clear to auscultation bilaterally with normal respiratory effort. CV: Nondisplaced PMI.  Heart regular S1/S2, no S3/S4, no murmur.  Trace ankle edema.  No carotid bruit.  Normal pedal pulses.  Abdomen: Soft, nontender, no hepatosplenomegaly, no distention.  Skin: Intact without lesions or rashes.  Neurologic: Alert and oriented x 3.  Psych: Normal affect. Extremities: No clubbing or cyanosis.   Assessment/Plan: 1. Ruptured sinus of valsalva aneurysm: s/p patch repair at [facility] in 11/14.  He is doing much  better symptomatically.  He does not have a significant murmur on exam.  I will get an echo for baseline evaluation post-surgery (to look for any aortic insufficiency or residual SOV leakage).   2. Chronic diastolic CHF: Suspect this was related to LV volume load from ruptured SOV aneurysm.  He looks euvolemic and is only taking Lasix 10 mg daily.  He can stop Lasix.  Will get BMET.  3. OSA: I strongly suspect OSA.  I am going to arrange a sleep study.  4. Hemochromatosis: Iron indices have been well-controlled.  He is followed by hematology.  He does not have a dilated cardiomyopathy.  He was unable to tolerate MRI to assess for myocardial hemochromatosis involvement.   Followup with me in 6 months.   [name] 10/12/2013

## 2013-10-27 ENCOUNTER — Ambulatory Visit (HOSPITAL_COMMUNITY): Payer: BC Managed Care – PPO | Attending: Cardiovascular Disease | Admitting: Radiology

## 2013-10-27 ENCOUNTER — Other Ambulatory Visit (HOSPITAL_COMMUNITY): Payer: BC Managed Care – PPO | Admitting: *Deleted

## 2013-10-27 ENCOUNTER — Encounter: Payer: Self-pay | Admitting: Cardiovascular Disease

## 2013-10-27 DIAGNOSIS — Q2549 Other congenital malformations of aorta: Secondary | ICD-10-CM

## 2013-10-27 DIAGNOSIS — I25810 Atherosclerosis of coronary artery bypass graft(s) without angina pectoris: Secondary | ICD-10-CM

## 2013-10-27 DIAGNOSIS — I2789 Other specified pulmonary heart diseases: Secondary | ICD-10-CM | POA: Insufficient documentation

## 2013-10-27 DIAGNOSIS — Z6837 Body mass index (BMI) 37.0-37.9, adult: Secondary | ICD-10-CM | POA: Insufficient documentation

## 2013-10-27 DIAGNOSIS — Q254 Other congenital malformations of aorta: Secondary | ICD-10-CM | POA: Insufficient documentation

## 2013-10-27 DIAGNOSIS — I5032 Chronic diastolic (congestive) heart failure: Secondary | ICD-10-CM

## 2013-10-27 DIAGNOSIS — I509 Heart failure, unspecified: Secondary | ICD-10-CM | POA: Insufficient documentation

## 2013-10-27 DIAGNOSIS — G4733 Obstructive sleep apnea (adult) (pediatric): Secondary | ICD-10-CM | POA: Insufficient documentation

## 2013-10-27 MED ORDER — PERFLUTREN PROTEIN A MICROSPH IV SUSP
2.0000 mL | Freq: Once | INTRAVENOUS | Status: AC
  Administered 2013-10-27: 2 mL via INTRAVENOUS

## 2013-10-27 NOTE — Progress Notes (Signed)
Echocardiogram performed with [employer].  

## 2013-11-04 ENCOUNTER — Ambulatory Visit (HOSPITAL_BASED_OUTPATIENT_CLINIC_OR_DEPARTMENT_OTHER): Payer: BC Managed Care – PPO | Attending: Cardiology

## 2013-11-04 DIAGNOSIS — I4949 Other premature depolarization: Secondary | ICD-10-CM | POA: Insufficient documentation

## 2013-11-04 DIAGNOSIS — G4733 Obstructive sleep apnea (adult) (pediatric): Secondary | ICD-10-CM

## 2013-11-09 ENCOUNTER — Telehealth: Payer: Self-pay | Admitting: Hematology & Oncology

## 2013-11-09 NOTE — Telephone Encounter (Signed)
Pt called made 2-5 appointment

## 2013-11-10 ENCOUNTER — Ambulatory Visit (HOSPITAL_BASED_OUTPATIENT_CLINIC_OR_DEPARTMENT_OTHER): Payer: BC Managed Care – PPO | Admitting: Hematology & Oncology

## 2013-11-10 ENCOUNTER — Encounter: Payer: Self-pay | Admitting: Hematology & Oncology

## 2013-11-10 ENCOUNTER — Other Ambulatory Visit (HOSPITAL_BASED_OUTPATIENT_CLINIC_OR_DEPARTMENT_OTHER): Payer: BC Managed Care – PPO | Admitting: Lab

## 2013-11-10 VITALS — BP 145/79 | HR 92 | Temp 98.3°F | Resp 18 | Ht 69.0 in | Wt 265.0 lb

## 2013-11-10 DIAGNOSIS — E291 Testicular hypofunction: Secondary | ICD-10-CM

## 2013-11-10 LAB — CBC WITH DIFFERENTIAL (CANCER CENTER ONLY)
BASO#: 0 10*3/uL (ref 0.0–0.2)
BASO%: 0.3 % (ref 0.0–2.0)
EOS%: 8.8 % — ABNORMAL HIGH (ref 0.0–7.0)
Eosinophils Absolute: 0.7 10*3/uL — ABNORMAL HIGH (ref 0.0–0.5)
HEMATOCRIT: 46.3 % (ref 38.7–49.9)
HGB: 15.6 g/dL (ref 13.0–17.1)
LYMPH#: 1.4 10*3/uL (ref 0.9–3.3)
LYMPH%: 18.5 % (ref 14.0–48.0)
MCH: 29.1 pg (ref 28.0–33.4)
MCHC: 33.7 g/dL (ref 32.0–35.9)
MCV: 86 fL (ref 82–98)
MONO#: 0.9 10*3/uL (ref 0.1–0.9)
MONO%: 11.6 % (ref 0.0–13.0)
NEUT#: 4.5 10*3/uL (ref 1.5–6.5)
NEUT%: 60.8 % (ref 40.0–80.0)
Platelets: 263 10*3/uL (ref 145–400)
RBC: 5.36 10*6/uL (ref 4.20–5.70)
RDW: 14.2 % (ref 11.1–15.7)
WBC: 7.4 10*3/uL (ref 4.0–10.0)

## 2013-11-10 MED ORDER — TESTOSTERONE CYPIONATE 200 MG/ML IM SOLN: 200.0000 mg | INTRAMUSCULAR | Status: DC

## 2013-11-10 NOTE — Progress Notes (Signed)
This office note has been dictated.

## 2013-11-11 LAB — IRON AND TIBC CHCC
%SAT: 41 % (ref 20–55)
Iron: 143 ug/dL (ref 42–163)
TIBC: 350 ug/dL (ref 202–409)
UIBC: 208 ug/dL (ref 117–376)

## 2013-11-11 LAB — TESTOSTERONE: Testosterone: 269 ng/dL — ABNORMAL LOW (ref 300–890)

## 2013-11-11 LAB — FERRITIN CHCC: FERRITIN: 39 ng/mL (ref 22–316)

## 2013-11-14 NOTE — Progress Notes (Signed)
CC:   [name], MD  DIAGNOSIS: 1. Hemochromatosis (homozygous for H63D mutation). 2. Hemochromatosis. 3. Hypogonadism secondary to hemochromatosis. 4. Erythrocytosis secondary to testosterone therapy. 5. Ruptured right sinus of Valsalva aneurysm.  CURRENT THERAPY: 1. The patient status post cardiac surgery to repair the sinus of     Valsalva aneurysm. 2. Phlebotomy to keep hematocrit below 48%. 3. Testosterone 200 mg IM q.2 weeks.  INTERIM HISTORY:  Kevin Wiggins comes in for followup.  Since we last saw him, he has had an incredible history.  He went to the hospital with what appeared to be a heart failure.  He was found to have a ruptured right sinus of Valsalva aneurysm.  He subsequently went to [facility].  He underwent surgical repair of this on 11/14.  He is followed by Dr. [name].  Dr. Aundra Dubin feels that Mr. Tetrault has been doing very well since his surgery.  Mr. Lapinsky feels pretty good.  He is back to work.  He is pretty much doing what he wishes.  Of note, Dr. Aundra Dubin is going to order a sleep study on him for the possibility of obstructive sleep apnea.  Mr. Peyton did have an echocardiogram done just done a couple weeks ago. He had a good ejection fraction of 55-60%.  Everything else looked okay. He has slightly increased wall thickness.  This was to the left ventricle.  Left atrium was mildly dilated.  Mr. Spiewak has had no cough.  He has had no abdominal pain.  He has had no fever, sweats, or chills.  PHYSICAL EXAMINATION:  General:  This is a benign somewhat obese white gentleman, in no obvious distress.  Vital Signs:  Temperature of 98.3, pulse 92, respiratory rate 18, blood pressure is 145/79, weight is 269 pounds.  Head and Neck:  Normocephalic, atraumatic skull.  There are no ocular or oral lesions.  There are no palpable cervical or supraclavicular lymph nodes.  Lungs:  Clear bilaterally.  Cardiac: Regular rate and rhythm with normal S1 and S2.  He has no  murmurs, rubs, or bruits.  Abdomen:  Soft.  He has good bowel sounds.  There is no palpable abdominal mass.  There is no palpable hepatosplenomegaly. Extremities:  Show no clubbing, cyanosis, or edema.  Neurological: Shows no focal neurological deficits.  Skin:  No rashes, ecchymosis, or petechia.  LABORATORY STUDIES:  White cell count 7.4, hemoglobin 15.6, hematocrit 46.3, platelet count 263.  IMPRESSION:  Mr. Mcgaha is a 53 year old gentleman.  He has hemochromatosis.  We had no problems with this.  His ferritins have always been under very good control.  His last ferritin in August was 27.  He does not need to be phlebotomized today.  We will go ahead and get him back in about 3 months' time now.  He has quite a few doctors appointments that he needs to make in the next couple of months.    ______________________________ [name], M.D. PRE/MEDQ  D:  11/10/2013  T:  11/11/2013  Job:  [id number]

## 2013-11-16 NOTE — Addendum Note (Signed)
Addended by: Leanor Rubenstein on: 11/16/2013 02:09 PM   Modules accepted: Level of Service

## 2013-11-17 DIAGNOSIS — G4733 Obstructive sleep apnea (adult) (pediatric): Secondary | ICD-10-CM

## 2013-11-17 NOTE — Sleep Study (Signed)
   NAME: Kevin Wiggins DATE OF BIRTH:  December 09, 1960 MEDICAL RECORD NUMBER [id number]  LOCATION: [facility]  PHYSICIAN: Kathee Delton  DATE OF STUDY: 11/04/2013  SLEEP STUDY TYPE: Nocturnal Polysomnogram               REFERRING PHYSICIAN: Larey Dresser, MD  INDICATION FOR STUDY: Hypersomnia with sleep apnea  EPWORTH SLEEPINESS SCORE:  4 HEIGHT:    WEIGHT:      There is no weight on file to calculate BMI.  NECK SIZE: 19.5 in.  MEDICATIONS: Reviewed in sleep chart  SLEEP ARCHITECTURE: The patient had a total sleep time of 234 minutes with no slow-wave sleep and only 13 minutes of REM. Sleep onset latency was normal at 17 minutes, and REM onset was prolonged at 289 minutes. Sleep efficiency was poor at 59%.  RESPIRATORY DATA: The patient was found to have 45 apneas and 176 obstructive hypopneas, giving him an AHI of 57 events per hour. The events occurred in all body positions, and there was loud snoring noted throughout. Attempts were made to initiate split-night protocol, however the patient refuses to wear CPAP.  OXYGEN DATA: There was transient oxygen desaturation as low as 84% with the patient's obstructive events  CARDIAC DATA: Occasional PVC noted, but no clinically significant arrhythmias were seen  MOVEMENT/PARASOMNIA: No significant limb movements or other abnormal behaviors were noted.  IMPRESSION/ RECOMMENDATION:    1)  severe obstructive sleep apnea/hypopnea syndrome, with an AHI of 57 events per hour and oxygen desaturation as low as 84%. Treatment for this degree of sleep apnea should focus primarily on a trial of CPAP, and as well as weight loss.  2)  occasional PVC noted.      Kathee Delton Diplomate, American Board of Sleep Medicine  ELECTRONICALLY SIGNED ON:  11/17/2013, 2:41 PM [place] PH: [phone]   FX: [phone] [place]

## 2013-11-21 ENCOUNTER — Telehealth: Payer: Self-pay | Admitting: *Deleted

## 2013-11-21 DIAGNOSIS — G4730 Sleep apnea, unspecified: Secondary | ICD-10-CM

## 2013-11-21 NOTE — Telephone Encounter (Signed)
Needs pulmonary referral for sleep apnea. ----- Message ----- From: Kathee Delton, MD Sent: 11/17/2013 2:45 PM To: Larey Dresser, MD, Mosie Lukes, MD

## 2013-12-07 ENCOUNTER — Institutional Professional Consult (permissible substitution): Payer: BC Managed Care – PPO | Admitting: Pulmonary Disease

## 2013-12-26 ENCOUNTER — Institutional Professional Consult (permissible substitution): Payer: BC Managed Care – PPO | Admitting: Pulmonary Disease

## 2014-01-05 ENCOUNTER — Ambulatory Visit: Payer: BC Managed Care – PPO

## 2014-01-05 ENCOUNTER — Other Ambulatory Visit (HOSPITAL_BASED_OUTPATIENT_CLINIC_OR_DEPARTMENT_OTHER): Payer: BC Managed Care – PPO | Admitting: Lab

## 2014-01-05 ENCOUNTER — Ambulatory Visit (HOSPITAL_BASED_OUTPATIENT_CLINIC_OR_DEPARTMENT_OTHER): Payer: BC Managed Care – PPO | Admitting: Hematology & Oncology

## 2014-01-05 VITALS — BP 156/93 | HR 96 | Temp 97.8°F | Resp 16

## 2014-01-05 DIAGNOSIS — E291 Testicular hypofunction: Secondary | ICD-10-CM

## 2014-01-05 DIAGNOSIS — N5201 Erectile dysfunction due to arterial insufficiency: Secondary | ICD-10-CM

## 2014-01-05 LAB — CBC WITH DIFFERENTIAL (CANCER CENTER ONLY)
BASO#: 0 10*3/uL (ref 0.0–0.2)
BASO%: 0.3 % (ref 0.0–2.0)
EOS%: 6 % (ref 0.0–7.0)
Eosinophils Absolute: 0.5 10*3/uL (ref 0.0–0.5)
HEMATOCRIT: 47.1 % (ref 38.7–49.9)
HGB: 16.7 g/dL (ref 13.0–17.1)
LYMPH#: 1.5 10*3/uL (ref 0.9–3.3)
LYMPH%: 19.1 % (ref 14.0–48.0)
MCH: 30.6 pg (ref 28.0–33.4)
MCHC: 35.5 g/dL (ref 32.0–35.9)
MCV: 86 fL (ref 82–98)
MONO#: 0.7 10*3/uL (ref 0.1–0.9)
MONO%: 9.4 % (ref 0.0–13.0)
NEUT#: 5 10*3/uL (ref 1.5–6.5)
NEUT%: 65.2 % (ref 40.0–80.0)
Platelets: 228 10*3/uL (ref 145–400)
RBC: 5.45 10*6/uL (ref 4.20–5.70)
RDW: 14.3 % (ref 11.1–15.7)
WBC: 7.6 10*3/uL (ref 4.0–10.0)

## 2014-01-05 LAB — TESTOSTERONE: Testosterone: 1090 ng/dL — ABNORMAL HIGH (ref 300–890)

## 2014-01-05 LAB — CHCC SATELLITE - SMEAR

## 2014-01-05 MED ORDER — SILDENAFIL CITRATE 50 MG PO TABS: 50.0000 mg | ORAL_TABLET | Freq: Every day | ORAL | Status: DC | PRN

## 2014-01-06 LAB — IRON AND TIBC CHCC
%SAT: 41 % (ref 20–55)
IRON: 119 ug/dL (ref 42–163)
TIBC: 289 ug/dL (ref 202–409)
UIBC: 170 ug/dL (ref 117–376)

## 2014-01-06 LAB — FERRITIN CHCC: FERRITIN: 46 ng/mL (ref 22–316)

## 2014-01-08 NOTE — Progress Notes (Signed)
CC:   [name], MD  DIAGNOSIS: 1. Hemochromatosis (homozygous for H63D mutation). 2. Hemochromatosis. 3. Hypogonadism secondary to hemochromatosis. 4. Erythrocytosis secondary to testosterone therapy. 5. Ruptured right sinus of Valsalva aneurysm.  CURRENT THERAPY: 1. The patient status post cardiac surgery to repair the sinus of     Valsalva aneurysm. 2. Phlebotomy to keep hematocrit below 48%. 3. Testosterone 200 mg IM q.2 weeks.  INTERIM HISTORY:  Kevin Wiggins comes in for followup.  Since we last saw him, he has had an incredible history.  He went to the hospital with what appeared to be a heart failure.  He was found to have a ruptured right sinus of Valsalva aneurysm.  He subsequently went to [facility].  He underwent surgical repair of this on 11/14.  He is followed by Dr. [name].  Dr. Aundra Dubin feels that Kevin Wiggins has been doing very well since his surgery.  Kevin Wiggins feels pretty good.  He is back to work.  He is pretty much doing what he wishes.  Of note, Dr. Aundra Dubin is going to order a sleep study on him for the possibility of obstructive sleep apnea.  Kevin Wiggins did have an echocardiogram done just done a couple weeks ago. He had a good ejection fraction of 55-60%.  Everything else looked okay. He has slightly increased wall thickness.  This was to the left ventricle.  Left atrium was mildly dilated.  Kevin Wiggins has had no cough.  He has had no abdominal pain.  He has had no fever, sweats, or chills.  PHYSICAL EXAMINATION:  General:  This is a benign somewhat obese white gentleman, in no obvious distress.  Vital Signs:  Temperature of 97.8, pulse 92, respiratory rate 18, blood pressure is 156/93, weight is 265 pounds.  Head and Neck:  Normocephalic, atraumatic skull.  There are no ocular or oral lesions.  There are no palpable cervical or supraclavicular lymph nodes.  Lungs:  Clear bilaterally.  Cardiac: Regular rate and rhythm with normal S1 and S2.  He has no  murmurs, rubs, or bruits.  Abdomen:  Soft.  He has good bowel sounds.  There is no palpable abdominal mass.  There is no palpable hepatosplenomegaly. Extremities:  Show no clubbing, cyanosis, or edema.  Neurological: Shows no focal neurological deficits.  Skin:  No rashes, ecchymosis, or petechia.  LABORATORY STUDIES:  White cell count 7.4, hemoglobin 16.7, hematocrit 47.1, platelet count 228. Testosterone is 1090  IMPRESSION:  Kevin Wiggins is a 54 year old gentleman.  He has hemochromatosis.  We had no problems with this.  His ferritins have always been under very good control.  His last ferritin was 46.   He does not need to be phlebotomized today.  We will go ahead and get him back in about 3 months' time now.  He has quite a few doctors appointments that he needs to make in the next couple of months.

## 2014-01-09 ENCOUNTER — Telehealth: Payer: Self-pay | Admitting: *Deleted

## 2014-01-09 NOTE — Telephone Encounter (Addendum)
Message copied by Orlando Penner on Mon Jan 09, 2014 11:52 AM ------      Message from: [name]      Created: Fri Jan 06, 2014  3:59 PM       Call - testosterone level is quite high!!  You can probably cut back the frequency of shots by 1-2 weeks. Pete ------This message given to pt. And he voiced understanding.

## 2014-02-16 ENCOUNTER — Other Ambulatory Visit: Payer: Self-pay | Admitting: *Deleted

## 2014-02-17 ENCOUNTER — Encounter: Payer: Self-pay | Admitting: Hematology & Oncology

## 2014-02-17 ENCOUNTER — Ambulatory Visit (HOSPITAL_BASED_OUTPATIENT_CLINIC_OR_DEPARTMENT_OTHER): Payer: BC Managed Care – PPO

## 2014-02-17 ENCOUNTER — Ambulatory Visit (HOSPITAL_BASED_OUTPATIENT_CLINIC_OR_DEPARTMENT_OTHER): Payer: BC Managed Care – PPO | Admitting: Hematology & Oncology

## 2014-02-17 ENCOUNTER — Other Ambulatory Visit (HOSPITAL_BASED_OUTPATIENT_CLINIC_OR_DEPARTMENT_OTHER): Payer: BC Managed Care – PPO | Admitting: Lab

## 2014-02-17 LAB — CBC WITH DIFFERENTIAL (CANCER CENTER ONLY)
BASO#: 0 10*3/uL (ref 0.0–0.2)
BASO%: 0.3 % (ref 0.0–2.0)
EOS ABS: 0.3 10*3/uL (ref 0.0–0.5)
EOS%: 4 % (ref 0.0–7.0)
HCT: 49.5 % (ref 38.7–49.9)
HGB: 17.7 g/dL — ABNORMAL HIGH (ref 13.0–17.1)
LYMPH#: 1.2 10*3/uL (ref 0.9–3.3)
LYMPH%: 16.3 % (ref 14.0–48.0)
MCH: 31.6 pg (ref 28.0–33.4)
MCHC: 35.8 g/dL (ref 32.0–35.9)
MCV: 88 fL (ref 82–98)
MONO#: 0.6 10*3/uL (ref 0.1–0.9)
MONO%: 9.1 % (ref 0.0–13.0)
NEUT#: 5 10*3/uL (ref 1.5–6.5)
NEUT%: 70.3 % (ref 40.0–80.0)
PLATELETS: 212 10*3/uL (ref 145–400)
RBC: 5.61 10*6/uL (ref 4.20–5.70)
RDW: 15.2 % (ref 11.1–15.7)
WBC: 7.1 10*3/uL (ref 4.0–10.0)

## 2014-02-17 LAB — TESTOSTERONE: TESTOSTERONE: 236 ng/dL — AB (ref 300–890)

## 2014-02-17 NOTE — Patient Instructions (Signed)
Therapeutic Phlebotomy Therapeutic phlebotomy is the controlled removal of blood from your body for the purpose of treating a medical condition. It is similar to donating blood. Usually, about a pint (470 mL) of blood is removed. The average adult has 9 to 12 pints (4.3 to 5.7 L) of blood. Therapeutic phlebotomy may be used to treat the following medical conditions:  Hemochromatosis. This is a condition in which there is too much iron in the blood.  Polycythemia vera. This is a condition in which there are too many red cells in the blood.  Porphyria cutanea tarda. This is a disease usually passed from one generation to the next (inherited). It is a condition in which an important part of hemoglobin is not made properly. This results in the build up of abnormal amounts of porphyrins in the body.  Sickle cell disease. This is an inherited disease. It is a condition in which the red blood cells form an abnormal crescent shape rather than a round shape. LET YOUR CAREGIVER KNOW ABOUT:  Allergies.  Medicines taken including herbs, eyedrops, over-the-counter medicines, and creams.  Use of steroids (by mouth or creams).  Previous problems with anesthetics or numbing medicine.  History of blood clots.  History of bleeding or blood problems.  Previous surgery.  Possibility of pregnancy, if this applies. RISKS AND COMPLICATIONS This is a simple and safe procedure. Problems are unlikely. However, problems can occur and may include:  Nausea or lightheadedness.  Low blood pressure.  Soreness, bleeding, swelling, or bruising at the needle insertion site.  Infection. BEFORE THE PROCEDURE  This is a procedure that can be done as an outpatient. Confirm the time that you need to arrive for your procedure. Confirm whether there is a need to fast or withhold any medications. It is helpful to wear clothing with sleeves that can be raised above the elbow. A blood sample may be done to determine the  amount of red blood cells or iron in your blood. Plan ahead of time to have someone drive you home after the procedure. PROCEDURE The entire procedure from preparation through recovery takes about 1 hour. The actual collection takes about 10 to 15 minutes.  A needle will be inserted into your vein.  Tubing and a collection bag will be attached to that needle.  Blood will flow through the needle and tubing into the collection bag.  You may be asked to open and close your hand slowly and continuously during the entire collection.  Once the specified amount of blood has been removed from your body, the collection bag and tubing will be clamped.  The needle will be removed.  Pressure will be held on the site of the needle insertion to stop the bleeding. Then a bandage will be placed over the needle insertion site. AFTER THE PROCEDURE  Your recovery will be assessed and monitored. If there are no problems, as an outpatient, you should be able to go home shortly after the procedure.  Document Released: 02/24/2011 Document Revised: 12/15/2011 Document Reviewed: 02/24/2011 [employer] Patient Information 2014 [employer].  

## 2014-02-17 NOTE — Progress Notes (Signed)
Kevin Wiggins presents today for phlebotomy per MD orders. Phlebotomy procedure started at 1540 and ended at 1510. 500 mls removed. Patient observed for 30 minutes after procedure without any incident. Patient tolerated procedure well. IV needle removed intact.

## 2014-02-20 LAB — IRON AND TIBC CHCC
%SAT: 50 % (ref 20–55)
Iron: 148 ug/dL (ref 42–163)
TIBC: 296 ug/dL (ref 202–409)
UIBC: 148 ug/dL (ref 117–376)

## 2014-02-20 LAB — FERRITIN CHCC: Ferritin: 44 ng/ml (ref 22–316)

## 2014-02-20 NOTE — Progress Notes (Signed)
CC:   Kevin Champagne, MD  DIAGNOSIS: 1. Hemochromatosis (homozygous for H63D mutation). 2. Hemochromatosis. 3. Hypogonadism secondary to hemochromatosis. 4. Erythrocytosis secondary to testosterone therapy. 5. Ruptured right sinus of Valsalva aneurysm.  CURRENT THERAPY: 1. The patient status post cardiac surgery to repair the sinus of     Valsalva aneurysm. 2. Phlebotomy to keep hematocrit below 48%. 3. Testosterone 200 mg IM q.2 weeks.  INTERIM HISTORY:  Kevin Wiggins comes in for followup.  Since we last saw him, he has had an incredible history.  He went to the hospital with what appeared to be a heart failure.  He was found to have a ruptured right sinus of Valsalva aneurysm.  He subsequently went to [facility].  He underwent surgical repair of this on 11/14.  He has gone through this very well.  He is followed by Dr. Loralie Wiggins.  Dr. Aundra Dubin feels that Kevin Wiggins has been doing very well since his surgery.  Kevin Wiggins feels pretty good. just moved. He now has his own house. He is really enjoying this. The vital signs  He is back to work.  He is pretty much doing what he wishes.  Of note, Dr. Aundra Dubin is going to order a sleep study on him for the possibility of obstructive sleep apnea.  Kevin Wiggins did have an echocardiogram done just done a couple weeks ago. He had a good ejection fraction of 55-60%.  Everything else looked okay. He has slightly increased wall thickness.  This was to the left ventricle.  Left atrium was mildly dilated.  Kevin Wiggins has had no cough.  He has had no abdominal pain.  He has had no fever, sweats, or chills.  PHYSICAL EXAMINATION:  General:  This is a benign somewhat obese white gentleman, in no obvious distress.  Vital Signs:  Temperature of  98.4 , pulse 90  respiratory rate 18, blood pressure is 157/94  weight is 263 pounds  pounds.  Head and Neck:  Normocephalic, atraumatic skull.  There are no ocular or oral lesions.  There are no palpable cervical  or supraclavicular lymph nodes.  Lungs:  Clear bilaterally.  Cardiac: Regular rate and rhythm with normal S1 and S2.  He has no murmurs, rubs, or bruits.  Abdomen:  Soft.  He has good bowel sounds.  There is no palpable abdominal mass.  There is no palpable hepatosplenomegaly. Extremities:  Show no clubbing, cyanosis, or edema.  Neurological: Shows no focal neurological deficits.  Skin:  No rashes, ecchymosis, or petechia.  LABORATORY STUDIES:  White cell count 7.1, hemoglobin 17.7, hematocrit 49 1, platelet count 212. Testosterone is 236.[name]  Ferritin 44. Iron saturation 50%.  IMPRESSION:  Kevin Wiggins is a 53 year old gentleman.  He has hemochromatosis.  We had no problems with this.  His ferritins have always been under very good control.  His last ferritin was 46.   He needs to be phlebotomized today.  We will go ahead and get him back in about 2 months' time now.  He has quite a few doctors appointments that he needs to make in the next couple of months.

## 2014-02-22 ENCOUNTER — Telehealth: Payer: Self-pay | Admitting: Hematology & Oncology

## 2014-02-22 NOTE — Telephone Encounter (Signed)
Left pt message with 7-1 appointment.  °

## 2014-04-05 ENCOUNTER — Ambulatory Visit (HOSPITAL_BASED_OUTPATIENT_CLINIC_OR_DEPARTMENT_OTHER): Payer: BC Managed Care – PPO | Admitting: Hematology & Oncology

## 2014-04-05 ENCOUNTER — Encounter: Payer: Self-pay | Admitting: Hematology & Oncology

## 2014-04-05 ENCOUNTER — Other Ambulatory Visit (HOSPITAL_BASED_OUTPATIENT_CLINIC_OR_DEPARTMENT_OTHER): Payer: BC Managed Care – PPO | Admitting: Lab

## 2014-04-05 ENCOUNTER — Ambulatory Visit (HOSPITAL_BASED_OUTPATIENT_CLINIC_OR_DEPARTMENT_OTHER): Payer: BC Managed Care – PPO

## 2014-04-05 VITALS — BP 139/86 | HR 89 | Resp 20

## 2014-04-05 DIAGNOSIS — I5033 Acute on chronic diastolic (congestive) heart failure: Secondary | ICD-10-CM

## 2014-04-05 DIAGNOSIS — E291 Testicular hypofunction: Secondary | ICD-10-CM

## 2014-04-05 LAB — CBC WITH DIFFERENTIAL (CANCER CENTER ONLY)
BASO#: 0 10*3/uL (ref 0.0–0.2)
BASO%: 0.3 % (ref 0.0–2.0)
EOS%: 4.3 % (ref 0.0–7.0)
Eosinophils Absolute: 0.4 10*3/uL (ref 0.0–0.5)
HEMATOCRIT: 48 % (ref 38.7–49.9)
HGB: 17.4 g/dL — ABNORMAL HIGH (ref 13.0–17.1)
LYMPH#: 1.6 10*3/uL (ref 0.9–3.3)
LYMPH%: 17.2 % (ref 14.0–48.0)
MCH: 32.8 pg (ref 28.0–33.4)
MCHC: 36.3 g/dL — ABNORMAL HIGH (ref 32.0–35.9)
MCV: 90 fL (ref 82–98)
MONO#: 0.9 10*3/uL (ref 0.1–0.9)
MONO%: 9.7 % (ref 0.0–13.0)
NEUT#: 6.2 10*3/uL (ref 1.5–6.5)
NEUT%: 68.5 % (ref 40.0–80.0)
PLATELETS: 255 10*3/uL (ref 145–400)
RBC: 5.31 10*6/uL (ref 4.20–5.70)
RDW: 13.5 % (ref 11.1–15.7)
WBC: 9.1 10*3/uL (ref 4.0–10.0)

## 2014-04-05 NOTE — Progress Notes (Signed)
Kevin Wiggins presents today for phlebotomy per MD orders. Phlebotomy procedure started at 1600 and ended at1615. Approximately 500 mls removed. Patient observed for 30 minutes after procedure without any incident. Patient tolerated procedure well. IV needle removed intact.

## 2014-04-05 NOTE — Progress Notes (Signed)
Hematology and Oncology Follow Up Visit  Kevin Wiggins [id number] 1960/10/24 53 y.o. 04/05/2014   Principle Diagnosis:     Hemochromatosis (homozygous for H63D mutation). 3. Hypogonadism secondary to hemochromatosis. 4. Erythrocytosis secondary to testosterone therapy. 5. Ruptured right sinus of Valsalva aneurysm.  Current Therapy:   1. The patient status post cardiac surgery to repair the sinus of     Valsalva aneurysm. 2. Phlebotomy to keep hematocrit below 48%. 3. Testosterone 200 mg IM q.2 weeks.     Interim History:  [name] is back for followup. Unfortunately, Kevin Wiggins is having a tough time right now. Kevin Wiggins has since moved back with his parents. They are doing poorly with their health.  Kevin Wiggins also is having some issues financially. Typically, Kevin Wiggins is always in a good mood. Kevin Wiggins does is somewhat somber today.  I told him to stop his testosterone.  Kevin Wiggins's gotten through his heart surgery without any difficulties. I don't think Kevin Wiggins has to go back to see the surgeon for quite a while.  Medications: Current outpatient prescriptions:amLODipine (NORVASC) 10 MG tablet, Take 1 tablet by mouth daily. 02-17-14  Has not taken in 5 days, states Kevin Wiggins will get it refilled., Disp: , Rfl: ;  aspirin 81 MG EC tablet, Take 81 mg by mouth daily. Often forgets, Disp: , Rfl: ;  sildenafil (VIAGRA) 50 MG tablet, Take 1 tablet (50 mg total) by mouth daily as needed for erectile dysfunction., Disp: 10 tablet, Rfl: 3 testosterone cypionate (DEPOTESTOTERONE CYPIONATE) 200 MG/ML injection, Inject 200 mg into the muscle every 14 (fourteen) days. ON HOLD, Disp: , Rfl:   Allergies:  Allergies  Allergen Reactions  . Watermelon Concentrate Swelling    Past Medical History, Surgical history, Social history, and Family History were reviewed and updated.  Review of Systems: As above  Physical Exam:  height is 5\' 9"  (1.753 m) and weight is 266 lb (120.657 kg). His oral temperature is 98 F (36.7 C). His blood pressure is  153/80 and his pulse is 93. His respiration is 18.   Well-developed and well-nourished white showman. Head and neck exam shows no for or oral lesions. Kevin Wiggins has no palpable cervical or supraclavicular lymph nodes. Lungs are clear. Cardiac exam regular in rhythm with no murmurs rubs or bruits. Abdomen is soft. There is no fluid wave. There is no palpable liver or spleen tip. Neck exam shows no tenderness over the spine ribs or hips. Extremities shows no clubbing cyanosis or edema. Neurological exam shows no focal neurological deficits. Skin exam no rashes.   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 His hemochromatosis. We will go ahead and phlebotomize him. We do this for the erythrocytosis that Kevin Wiggins has from the testosterone. This helps with his ferritin. His last  ferritin back in May was 44.  We will plan to get him back in another 4 weeks or so.  I will definitely pray for him. I just feel bad Kevin Wiggins's gone through a difficult time. Kevin Wiggins always has been very "upbeat ."  [name], MD 7/1/20154:08 PM

## 2014-04-05 NOTE — Patient Instructions (Signed)
Therapeutic Phlebotomy Care After Refer to this sheet in the next few weeks. These instructions provide you with information on caring for yourself after your procedure. Your caregiver may also give you more specific instructions. Your treatment has been planned according to current medical practices, but problems sometimes occur. Call your caregiver if you have any problems or questions after your procedure. HOME CARE INSTRUCTIONS Most people can go back to their normal activities right away. Before you leave, be sure to ask if there is anything you should or should not do. In general, it would be wise to:  Keep the bandage dry. You can remove the bandage after about 5 hours.  Eat well-balanced meals for the next 24 hours.  Drink enough fluids to keep your urine clear or pale yellow.  Avoid drinking alcohol minimally until after eating.  Avoid smoking for at least 30 minutes after the procedure.  Avoid strenous physical activity or heavy lifting or pulling for about 5 hours after the procedure.  Athletes should avoid strenous exercise for 12 hours or more.  Change positions slowly for the remainder of the day to prevent lightheadedness or fainting.  If you feel lightheaded, lie down until the feeling subsides.  If you have bleeding from the needle insertion site, elevate your arm and press firmly on the site until the bleeding stops.  If bruising or bleeding appears under the skin, apply ice to the area for 15 to 20 minutes, 3 to 4 times per day. Put the ice in a plastic bag and place a towel between the bag of ice and your skin. Do this while you are awake for the first 24 hours. The ice packs can be stopped before 24 hours if the swelling goes away. If swelling persists after 24 hours, a warm, moist washcloth can be applied to the area for 15 to 20 minutes, 3 to 4 times per day. The warm, moist treatments can be stopped when the swelling goes away.  It is important to continue further  therapeutic phlebotomy as directed by your caregiver. SEEK MEDICAL CARE IF:  There is bleeding or fluid leaking from the needle insertion site.  The needle insertion site becomes swollen, red, or sore.  You feel lightheaded, dizzy or nauseated, and the feeling does not go away.  You notice new bruising at the needle insertion site.  You feel more weak or tired than normal.  You develop a fever. SEEK IMMEDIATE MEDICAL CARE IF:   There is increased bleeding, pain, or swelling from the needle insertion site.  You have severe nausea or vomiting.  You have chest pain.  You have trouble breathing. MAKE SURE YOU:  Understand these instructions.  Will watch your condition.  Will get help right away if you are not doing well or get worse. Document Released: 02/24/2011 Document Revised: 12/15/2011 Document Reviewed: 02/24/2011 ExitCare Patient Information 2015 [employer]. This information is not intended to replace advice given to you by your health care provider. Make sure you discuss any questions you have with your health care provider.  

## 2014-04-28 ENCOUNTER — Encounter: Payer: Self-pay | Admitting: *Deleted

## 2014-05-02 ENCOUNTER — Encounter: Payer: Self-pay | Admitting: Cardiology

## 2014-05-02 ENCOUNTER — Ambulatory Visit (INDEPENDENT_AMBULATORY_CARE_PROVIDER_SITE_OTHER): Payer: BC Managed Care – PPO | Admitting: Cardiology

## 2014-05-02 VITALS — BP 140/88 | HR 94 | Ht 69.0 in | Wt 265.0 lb

## 2014-05-02 DIAGNOSIS — Q254 Other congenital malformations of aorta: Secondary | ICD-10-CM

## 2014-05-02 DIAGNOSIS — I5033 Acute on chronic diastolic (congestive) heart failure: Secondary | ICD-10-CM

## 2014-05-02 DIAGNOSIS — N5201 Erectile dysfunction due to arterial insufficiency: Secondary | ICD-10-CM

## 2014-05-02 DIAGNOSIS — N529 Male erectile dysfunction, unspecified: Secondary | ICD-10-CM

## 2014-05-02 DIAGNOSIS — I5032 Chronic diastolic (congestive) heart failure: Secondary | ICD-10-CM

## 2014-05-02 DIAGNOSIS — I10 Essential (primary) hypertension: Secondary | ICD-10-CM

## 2014-05-02 DIAGNOSIS — Q2543 Congenital aneurysm of aorta: Secondary | ICD-10-CM

## 2014-05-02 MED ORDER — SILDENAFIL CITRATE 50 MG PO TABS: 50.0000 mg | ORAL_TABLET | Freq: Every day | ORAL | Status: DC | PRN

## 2014-05-02 MED ORDER — LISINOPRIL 20 MG PO TABS: 20.0000 mg | ORAL_TABLET | Freq: Every day | ORAL | Status: DC

## 2014-05-02 NOTE — Patient Instructions (Signed)
Stop amlodipine.   Start lisinopril 20mg  daily.  Your physician recommends that you return for lab work in: 2 weeks--BMET.   Your physician has requested that you regularly monitor and record your blood pressure readings at home. Please use the same machine at the same time of day to check your readings and record them to bring to your follow-up visit.  Your physician wants you to follow-up in: 6 months with Dr Aundra Dubin. (January 2016).  You will receive a reminder letter in the mail two months in advance. If you don't receive a letter, please call our office to schedule the follow-up appointment.

## 2014-05-02 NOTE — Progress Notes (Signed)
Patient ID: Kevin Wiggins, male   DOB: [birth year], 53 y.o.   MRN: [id number] PCP: Dr. Hassell Done  53 yo with history of hereditary hemochromatosis (closely followed by hematology with phlebotomies) and HTN presented to [facility] in 10/14 with symptoms consistent with CHF.  Dyspnea developed in early 10/14.  Prior to this, he had had no exertional symptoms.  In the hospital, he was noted to be volume overloaded and was diuresed.  He had a loud systolic murmur.  TTE showed normal LV systolic function with a moderately dilated RV.  He had what appeared to be a small peri-membranous VSD.  TEE showed that this actually was probably a ruptured right sinus of valsalva aneurysm.  He had a RHC/LHC done next given CHF.  This showed nonobstructive CAD and moderate pulmonary hypertension.  Qp/Qs was 2.4/1.  He had a coronary CTA, which showed no pulmonary vein anomalies. Patient was referred to Dr Darcus Austin for congenital cardiology evaluation.  He had patch repair of the ruptured SOV aneurysm in 11/14 at [facility].  Post-op course was uncomplicated.  Mr Ballow has done quite well since his surgery.  He is back at work.  He is no longer taking Lasix.  Breathing is much better, no particular DOE now.  He can get up a flight of steps with no problems.  No chest pain.  He does not like amlodipine, says that it makes his legs swell and makes him lethargic.  He was diagnosed by sleep study with severe OSA but did not tolerate CPAP.   Labs (10/14): K 4.4, creatinine 1.7, BNP 1054 => 274, LDL 141 Labs (1/15): HCT 48, K 4, creatinine 1.1  ECG: NSR, LAFB, RBBB  PMH: 1. Hemochromatosis: Followed by hematology, gets periodic phlebotomies, has been controlled.  2. HTN 3. CKD 4. Diastolic CHF 5. Ruptured sinus of valsalva aneurysm: Echo (10/14) with EF 60-65%, moderate RV dilation/normal function, small-appearing ?VSD (left to right flow).  TEE (10/14) with EF 55-60%, moderately dilated RV, suspect ruptured sinus of valsalva aneurysm,  trivial AI.  LHC/RHC (10/14) with nonobstructive mild CAD; mean RA 6, PA 56/22 mean 35, mean PCWP 18, PA 82%, IVC 67%, SVG 59%, CI 2.1, Qp/Qs 2.4/1.  He was unable to tolerate cardiac MRI/MRA due to claustrophobia.  Coronary CTA (10/14): Nonobstructive mild coronary disease, no pulmonary vein anomaly, enlarged right sinus of valsalva.  Patch repair of ruptured SOV aneurysm in 11/14 at [facility].  Echo (1/15) with EF 55-60%, mild LVH, stable aortic root s/p sinus of valsalva fistula repair.  6. CAD: Nonobstructive on 10/14 LHC.  Coronary CTA with calcium score 21.9 suggesting intermediate risk (61st percentile).  7. Hyperlipidemia 8. Hypogonadism: Related to hemochromatosis.  9. OSA: Severe by sleep study in 2/15.  Unable to wear CPAP.   SH: Single, lives in [place], [place], nonsmoker.   FH: Uncle with CHF  ROS: All systems reviewed and negative except as per HPI.   Current Outpatient Prescriptions  Medication Sig Dispense Refill  . aspirin 81 MG EC tablet Take 81 mg by mouth daily. Often forgets      . lisinopril (PRINIVIL,ZESTRIL) 20 MG tablet Take 1 tablet (20 mg total) by mouth daily.  90 tablet  0  . sildenafil (VIAGRA) 50 MG tablet Take 1 tablet (50 mg total) by mouth daily as needed for erectile dysfunction.  10 tablet  0  . testosterone cypionate (DEPOTESTOTERONE CYPIONATE) 200 MG/ML injection Inject 200 mg into the muscle every 14 (fourteen) days. ON HOLD  No current facility-administered medications for this visit.    BP 140/88  Pulse 94  Ht 5\' 9"  (1.753 m)  Wt 265 lb (120.203 kg)  BMI 39.12 kg/m2 General: NAD, obese Neck: Thick, JVP 7 cm, no thyromegaly or thyroid nodule.  Lungs: Clear to auscultation bilaterally with normal respiratory effort. CV: Nondisplaced PMI.  Heart regular S1/S2, no S3/S4, no murmur.  Trace ankle edema.  No carotid bruit.  Normal pedal pulses.  Abdomen: Soft, nontender, no hepatosplenomegaly, no distention.  Skin: Intact without  lesions or rashes.  Neurologic: Alert and oriented x 3.  Psych: Normal affect. Extremities: No clubbing or cyanosis.   Assessment/Plan: 1. Ruptured sinus of valsalva aneurysm: s/p patch repair at [facility] in 11/14.  He is doing much better symptomatically.  He does not have a significant murmur on exam.  Baseline echo post-op in 1/15 was stable.  2. Chronic diastolic CHF: Suspect this was related to LV volume load from ruptured SOV aneurysm, now resolved.   3. OSA: Severe by sleep study but he is unable to tolerate CPAP.   4. Hemochromatosis: Iron indices have been well-controlled.  He is followed by hematology.  He does not have a dilated cardiomyopathy.  He was unable to tolerate MRI to assess for myocardial hemochromatosis involvement.  5. Erectile dysfunction: Would be ok to use Viagra.  6. HTN: Side effects from amlodipine. Stop amlodipine, start lisinopril 20 mg daily with BMET and BP check in 2 wks.   Followup with me in 6 months.   [name] 05/02/2014

## 2014-05-04 ENCOUNTER — Telehealth: Payer: Self-pay | Admitting: Cardiology

## 2014-05-04 NOTE — Telephone Encounter (Signed)
Spoke with [facility] pharmacy for prior Viagra prior authorization,at [id number]. The prior authorization person states that we need to answer the questions given in the form before the medication can be authorized. This Form will be fax to [place] today. Pt's Member ID # is [id number].

## 2014-05-04 NOTE — Telephone Encounter (Signed)
New message      Need Dr Aundra Dubin to call and get prior approval for viagra---call [phone]. He said someone from here called him and asked him to call and give [place] the number

## 2014-05-05 NOTE — Addendum Note (Signed)
Addended by: Katrine Coho on: 05/05/2014 08:01 AM   Modules accepted: Orders

## 2014-05-05 NOTE — Telephone Encounter (Signed)
The prior authorization form that Dr Aundra Dubin received was from [employer] [phone]. This has been completed.

## 2014-05-05 NOTE — Telephone Encounter (Signed)
This has been done and forwarded to HIM to be faxed.

## 2014-05-09 NOTE — Telephone Encounter (Signed)
refaxed prior authorization form for Viagra

## 2014-05-10 ENCOUNTER — Telehealth: Payer: Self-pay | Admitting: Cardiology

## 2014-05-10 NOTE — Telephone Encounter (Signed)
Spoke with Ailene Ravel at [facility] and advised her that prior authorization was faxed yesterday afternoon.  Ailene Ravel thanked me for the call and states she will update her records.

## 2014-05-10 NOTE — Telephone Encounter (Signed)
New problem    Want to know the status of authorization for insurance for pt's Vigara. Please advise

## 2014-05-15 ENCOUNTER — Other Ambulatory Visit (INDEPENDENT_AMBULATORY_CARE_PROVIDER_SITE_OTHER): Payer: BC Managed Care – PPO

## 2014-05-15 ENCOUNTER — Telehealth: Payer: Self-pay | Admitting: Cardiology

## 2014-05-15 DIAGNOSIS — N529 Male erectile dysfunction, unspecified: Secondary | ICD-10-CM

## 2014-05-15 DIAGNOSIS — Q254 Other congenital malformations of aorta: Secondary | ICD-10-CM

## 2014-05-15 DIAGNOSIS — N5201 Erectile dysfunction due to arterial insufficiency: Secondary | ICD-10-CM

## 2014-05-15 DIAGNOSIS — I5032 Chronic diastolic (congestive) heart failure: Secondary | ICD-10-CM

## 2014-05-15 NOTE — Telephone Encounter (Signed)
New message     Checking on status for viagra and prior auth.  Have we heard from the ins company?

## 2014-05-15 NOTE — Telephone Encounter (Signed)
Contacted Meredith at [facility] that Dr Aundra Dubin and nurse are both out of the office this week.  Informed Ailene Ravel that I checked there mail, and no info faxed as of yet in regards to pts Viagra being authorized by [employer] yet.  Informed Ailene Ravel that the prior Josem Kaufmann has been faxed x 2.  Informed Ailene Ravel if the info is received this week, then we will contact her at [facility] to follow-up thereafter.  Will keep this on the triage desktop to f/u on.

## 2014-05-16 LAB — BASIC METABOLIC PANEL
BUN: 14 mg/dL (ref 6–23)
CALCIUM: 9.4 mg/dL (ref 8.4–10.5)
CO2: 25 meq/L (ref 19–32)
CREATININE: 1.3 mg/dL (ref 0.4–1.5)
Chloride: 101 mEq/L (ref 96–112)
GFR: 64.05 mL/min (ref >60.00)
Glucose, Bld: 107 mg/dL — ABNORMAL HIGH (ref 70–99)
Potassium: 4.1 mEq/L (ref 3.5–5.1)
Sodium: 138 mEq/L (ref 135–145)

## 2014-05-18 ENCOUNTER — Telehealth: Payer: Self-pay | Admitting: Hematology & Oncology

## 2014-05-18 NOTE — Telephone Encounter (Signed)
Pt cx 8-14 MD is going to keep lab. He rescheduled for 9-24, Dr. Marin Olp ok with that

## 2014-05-19 ENCOUNTER — Other Ambulatory Visit (HOSPITAL_BASED_OUTPATIENT_CLINIC_OR_DEPARTMENT_OTHER): Payer: BC Managed Care – PPO | Admitting: Lab

## 2014-05-19 ENCOUNTER — Ambulatory Visit: Payer: BC Managed Care – PPO | Admitting: Hematology & Oncology

## 2014-05-19 ENCOUNTER — Other Ambulatory Visit: Payer: BC Managed Care – PPO | Admitting: Lab

## 2014-05-19 LAB — CBC WITH DIFFERENTIAL (CANCER CENTER ONLY)
BASO#: 0 10*3/uL (ref 0.0–0.2)
BASO%: 0.1 % (ref 0.0–2.0)
EOS%: 4.6 % (ref 0.0–7.0)
Eosinophils Absolute: 0.4 10*3/uL (ref 0.0–0.5)
HCT: 49 % (ref 38.7–49.9)
HGB: 17.3 g/dL — ABNORMAL HIGH (ref 13.0–17.1)
LYMPH#: 1.3 10*3/uL (ref 0.9–3.3)
LYMPH%: 16 % (ref 14.0–48.0)
MCH: 32.7 pg (ref 28.0–33.4)
MCHC: 35.3 g/dL (ref 32.0–35.9)
MCV: 93 fL (ref 82–98)
MONO#: 0.7 10*3/uL (ref 0.1–0.9)
MONO%: 8.7 % (ref 0.0–13.0)
NEUT%: 70.6 % (ref 40.0–80.0)
NEUTROS ABS: 5.8 10*3/uL (ref 1.5–6.5)
PLATELETS: 241 10*3/uL (ref 145–400)
RBC: 5.29 10*6/uL (ref 4.20–5.70)
RDW: 13.6 % (ref 11.1–15.7)
WBC: 8.3 10*3/uL (ref 4.0–10.0)

## 2014-05-19 LAB — COMPREHENSIVE METABOLIC PANEL
ALT: 50 U/L (ref 0–53)
AST: 27 U/L (ref 0–37)
Albumin: 4.2 g/dL (ref 3.5–5.2)
Alkaline Phosphatase: 50 U/L (ref 39–117)
BILIRUBIN TOTAL: 0.7 mg/dL (ref 0.2–1.2)
BUN: 12 mg/dL (ref 6–23)
CO2: 24 mEq/L (ref 19–32)
Calcium: 9.4 mg/dL (ref 8.4–10.5)
Chloride: 101 mEq/L (ref 96–112)
Creatinine, Ser: 1.12 mg/dL (ref 0.50–1.35)
Glucose, Bld: 135 mg/dL — ABNORMAL HIGH (ref 70–99)
Potassium: 4.2 mEq/L (ref 3.5–5.3)
SODIUM: 137 meq/L (ref 135–145)
Total Protein: 6.8 g/dL (ref 6.0–8.3)

## 2014-05-19 LAB — TESTOSTERONE: TESTOSTERONE: 338 ng/dL (ref 300–890)

## 2014-05-22 LAB — IRON AND TIBC CHCC
%SAT: 26 % (ref 20–55)
IRON: 85 ug/dL (ref 42–163)
TIBC: 326 ug/dL (ref 202–409)
UIBC: 241 ug/dL (ref 117–376)

## 2014-05-22 LAB — FERRITIN CHCC: FERRITIN: 26 ng/mL (ref 22–316)

## 2014-05-23 ENCOUNTER — Telehealth: Payer: Self-pay | Admitting: *Deleted

## 2014-05-23 ENCOUNTER — Other Ambulatory Visit: Payer: Self-pay | Admitting: *Deleted

## 2014-05-23 NOTE — Telephone Encounter (Signed)
Message copied by Rico Ala on Tue May 23, 2014 10:18 AM ------      Message from: [name]      Created: Sun May 21, 2014  8:50 PM       Call - his blood is a little too high!!  Need to phlebotomize 1 unit this week.  Please set up!!  pete ------

## 2014-05-24 ENCOUNTER — Ambulatory Visit (HOSPITAL_BASED_OUTPATIENT_CLINIC_OR_DEPARTMENT_OTHER): Payer: BC Managed Care – PPO

## 2014-05-24 NOTE — Telephone Encounter (Signed)
The prior authorization form for Viagra completed by Dr Aundra Dubin has been re faxed to [employer] today. This is the 3rd time this form has been faxed.

## 2014-05-24 NOTE — Patient Instructions (Signed)

## 2014-05-24 NOTE — Progress Notes (Signed)
Kevin Wiggins presents today for phlebotomy per MD orders. Phlebotomy procedure started at 1600 and ended at 1608. 500 grams removed. Patient observed for 30 minutes after procedure without any incident. Patient tolerated procedure well. IV needle removed intact.

## 2014-05-29 ENCOUNTER — Telehealth: Payer: Self-pay | Admitting: *Deleted

## 2014-05-29 DIAGNOSIS — I10 Essential (primary) hypertension: Secondary | ICD-10-CM

## 2014-05-29 MED ORDER — LOSARTAN POTASSIUM 50 MG PO TABS: 50.0000 mg | ORAL_TABLET | Freq: Every day | ORAL | Status: DC

## 2014-05-29 NOTE — Telephone Encounter (Signed)
Copied from Dr Claris Gladden 05/02/14 office note: HTN: Side effects from amlodipine. Stop amlodipine, start lisinopril 20 mg daily with BMET and BP check in 2 wks.   05/29/14:  Pt states his BP, 123/83, is better on lisinopril but he states it makes him feel bad and he does not have any energy.  Pt does not feel he can continue lisinopril.  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   I will forward to Dr Aundra Dubin for review.

## 2014-05-29 NOTE — Telephone Encounter (Signed)
Received notice from [employer] ([id number])Viagra approved 04/26/14-05/25/17.

## 2014-05-29 NOTE — Telephone Encounter (Signed)
LMTCB

## 2014-05-29 NOTE — Telephone Encounter (Signed)
Pt advised,verbalized understanding. 

## 2014-05-29 NOTE — Telephone Encounter (Signed)
Follow up ° ° ° ° °Returning a nurses call °

## 2014-05-29 NOTE — Telephone Encounter (Signed)
Stop lisinopril and start losartan 50 mg daily.  BMET and BP check 2 wks.

## 2014-06-13 ENCOUNTER — Other Ambulatory Visit (INDEPENDENT_AMBULATORY_CARE_PROVIDER_SITE_OTHER): Payer: BC Managed Care – PPO

## 2014-06-13 DIAGNOSIS — I10 Essential (primary) hypertension: Secondary | ICD-10-CM

## 2014-06-13 LAB — BASIC METABOLIC PANEL
BUN: 16 mg/dL (ref 6–23)
CALCIUM: 9.1 mg/dL (ref 8.4–10.5)
CO2: 27 mEq/L (ref 19–32)
CREATININE: 1.2 mg/dL (ref 0.4–1.5)
Chloride: 102 mEq/L (ref 96–112)
GFR: 64.62 mL/min (ref >60.00)
GLUCOSE: 167 mg/dL — AB (ref 70–99)
Potassium: 3.8 mEq/L (ref 3.5–5.1)
SODIUM: 137 meq/L (ref 135–145)

## 2014-06-28 ENCOUNTER — Other Ambulatory Visit: Payer: Self-pay | Admitting: *Deleted

## 2014-06-29 ENCOUNTER — Encounter: Payer: Self-pay | Admitting: Hematology & Oncology

## 2014-06-29 ENCOUNTER — Ambulatory Visit (HOSPITAL_BASED_OUTPATIENT_CLINIC_OR_DEPARTMENT_OTHER): Payer: BC Managed Care – PPO

## 2014-06-29 ENCOUNTER — Ambulatory Visit (HOSPITAL_BASED_OUTPATIENT_CLINIC_OR_DEPARTMENT_OTHER): Payer: BC Managed Care – PPO | Admitting: Hematology & Oncology

## 2014-06-29 ENCOUNTER — Other Ambulatory Visit (HOSPITAL_BASED_OUTPATIENT_CLINIC_OR_DEPARTMENT_OTHER): Payer: BC Managed Care – PPO | Admitting: Lab

## 2014-06-29 ENCOUNTER — Other Ambulatory Visit: Payer: Self-pay | Admitting: Nurse Practitioner

## 2014-06-29 DIAGNOSIS — N529 Male erectile dysfunction, unspecified: Secondary | ICD-10-CM

## 2014-06-29 DIAGNOSIS — D751 Secondary polycythemia: Secondary | ICD-10-CM

## 2014-06-29 DIAGNOSIS — Q210 Ventricular septal defect: Secondary | ICD-10-CM

## 2014-06-29 DIAGNOSIS — I5032 Chronic diastolic (congestive) heart failure: Secondary | ICD-10-CM

## 2014-06-29 DIAGNOSIS — E291 Testicular hypofunction: Secondary | ICD-10-CM

## 2014-06-29 LAB — CBC WITH DIFFERENTIAL (CANCER CENTER ONLY)
BASO#: 0 10*3/uL (ref 0.0–0.2)
BASO%: 0.1 % (ref 0.0–2.0)
EOS%: 4.9 % (ref 0.0–7.0)
Eosinophils Absolute: 0.4 10*3/uL (ref 0.0–0.5)
HCT: 49.5 % (ref 38.7–49.9)
HGB: 17.8 g/dL — ABNORMAL HIGH (ref 13.0–17.1)
LYMPH#: 1.5 10*3/uL (ref 0.9–3.3)
LYMPH%: 18.5 % (ref 14.0–48.0)
MCH: 32.7 pg (ref 28.0–33.4)
MCHC: 36 g/dL — ABNORMAL HIGH (ref 32.0–35.9)
MCV: 91 fL (ref 82–98)
MONO#: 0.7 10*3/uL (ref 0.1–0.9)
MONO%: 8.5 % (ref 0.0–13.0)
NEUT#: 5.4 10*3/uL (ref 1.5–6.5)
NEUT%: 68 % (ref 40.0–80.0)
PLATELETS: 232 10*3/uL (ref 145–400)
RBC: 5.44 10*6/uL (ref 4.20–5.70)
RDW: 13.5 % (ref 11.1–15.7)
WBC: 7.9 10*3/uL (ref 4.0–10.0)

## 2014-06-29 MED ORDER — TADALAFIL 20 MG PO TABS: 20.0000 mg | ORAL_TABLET | Freq: Every day | ORAL | Status: DC | PRN

## 2014-06-29 MED ORDER — TESTOSTERONE CYPIONATE 200 MG/ML IM SOLN: 300.0000 mg | Freq: Once | INTRAMUSCULAR | Status: DC

## 2014-06-29 NOTE — Patient Instructions (Signed)

## 2014-06-29 NOTE — Progress Notes (Signed)
Kevin Wiggins presents today for phlebotomy per MD orders. Phlebotomy procedure started at 1500 and ended at 1515. 500 grams removed. Patient observed for 30 minutes after procedure without any incident. Patient tolerated procedure well. IV needle removed intact.

## 2014-06-29 NOTE — Progress Notes (Signed)
Hematology and Oncology Follow Up Visit  Kevin Wiggins [id number] 07-17-1961 53 y.o. 06/29/2014   Principle Diagnosis:     Hemochromatosis (homozygous for H63D mutation). 3. Hypogonadism secondary to hemochromatosis. 4. Erythrocytosis secondary to testosterone therapy. 5. Ruptured right sinus of Valsalva aneurysm.  Current Therapy:   1. The patient status post cardiac surgery to repair the sinus of     Valsalva aneurysm. 2. Phlebotomy to keep hematocrit below 48%. 3. Testosterone 200 mg IM q.2 weeks.     Interim History:  Kevin Wiggins is back for followup. Things are working out for him to be well right now. He is back with his parents because of his mom's illness. However, his meds he nice young lady. He is to have a nice time with her. He really had a good quality of life right now. However, he does have the erectile dysfunction. He has hypergonadism  from his hemochromatosis. We will have to change his testosterone dose. He was on Viagra but this was not helping that much.  He blood be phlebotomized today. His hematocrit is 49.5. I told him to make sure that he takes aspirin. Medications: Current outpatient prescriptions:aspirin 81 MG EC tablet, Take 81 mg by mouth daily. Often forgets, Disp: , Rfl: ;  losartan (COZAAR) 50 MG tablet, Take 1 tablet (50 mg total) by mouth daily., Disp: 30 tablet, Rfl: 1;  tadalafil (CIALIS) 20 MG tablet, Take 1 tablet (20 mg total) by mouth daily as needed for erectile dysfunction., Disp: 10 tablet, Rfl: 2 testosterone cypionate (DEPOTESTOTERONE CYPIONATE) 200 MG/ML injection, Inject 1.5 mLs (300 mg total) into the muscle once., Disp: 20 mL, Rfl: 3  Allergies:  Allergies  Allergen Reactions  . Watermelon Concentrate Swelling    Past Medical History, Surgical history, Social history, and Family History were reviewed and updated.  Review of Systems: As above  Physical Exam:  height is 5\' 9"  (1.753 m) and weight is 270 lb (122.471 kg). His oral  temperature is 97.5 F (36.4 C). His blood pressure is 131/77 and his pulse is 102. His respiration is 18.   Well-developed and well-nourished white showman. Head and neck exam shows no for or oral lesions. He has no palpable cervical or supraclavicular lymph nodes. Lungs are clear. Cardiac exam regular in rhythm with no murmurs rubs or bruits. Abdomen is soft. There is no fluid wave. There is no palpable liver or spleen tip. Neck exam shows no tenderness over the spine ribs or hips. Extremities shows no clubbing cyanosis or edema. Neurological exam shows no focal neurological deficits. Skin exam no rashes.   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 He has hemochromatosis. We will go ahead and phlebotomize him. We do this for the erythrocytosis that he has  from the testosterone. This helps with his ferritin. His last ferritin back in August was 26.   We will plan to get him back in another 4 weeks or so.  [name], MD 9/24/20153:26 PM

## 2014-06-30 LAB — IRON AND TIBC CHCC
%SAT: 39 % (ref 20–55)
Iron: 123 ug/dL (ref 42–163)
TIBC: 311 ug/dL (ref 202–409)
UIBC: 189 ug/dL (ref 117–376)

## 2014-06-30 LAB — COMPREHENSIVE METABOLIC PANEL
ALT: 50 U/L (ref 0–53)
AST: 28 U/L (ref 0–37)
Albumin: 4.1 g/dL (ref 3.5–5.2)
Alkaline Phosphatase: 54 U/L (ref 39–117)
BUN: 13 mg/dL (ref 6–23)
CHLORIDE: 101 meq/L (ref 96–112)
CO2: 23 mEq/L (ref 19–32)
Calcium: 9.8 mg/dL (ref 8.4–10.5)
Creatinine, Ser: 1.17 mg/dL (ref 0.50–1.35)
Glucose, Bld: 175 mg/dL — ABNORMAL HIGH (ref 70–99)
Potassium: 4.2 mEq/L (ref 3.5–5.3)
Sodium: 138 mEq/L (ref 135–145)
Total Bilirubin: 0.7 mg/dL (ref 0.2–1.2)
Total Protein: 7.1 g/dL (ref 6.0–8.3)

## 2014-06-30 LAB — TESTOSTERONE: Testosterone: 521 ng/dL (ref 300–890)

## 2014-06-30 LAB — FERRITIN CHCC: FERRITIN: 32 ng/mL (ref 22–316)

## 2014-07-03 ENCOUNTER — Telehealth: Payer: Self-pay | Admitting: Hematology & Oncology

## 2014-07-03 NOTE — Telephone Encounter (Signed)
[employer] approved CIALIS TABLET and TESTOSTERONE CYPIONATE VIAL.   Dates:  [id number] - 05/31/2014-06/29/2017 - Cialis Tablet [id number] - 05/31/2014 - 05/31/2014 - Testosterone Cypionate          COPY SCANNED

## 2014-08-07 ENCOUNTER — Other Ambulatory Visit (HOSPITAL_BASED_OUTPATIENT_CLINIC_OR_DEPARTMENT_OTHER): Payer: BC Managed Care – PPO | Admitting: Lab

## 2014-08-07 ENCOUNTER — Ambulatory Visit (HOSPITAL_BASED_OUTPATIENT_CLINIC_OR_DEPARTMENT_OTHER): Payer: BC Managed Care – PPO

## 2014-08-07 ENCOUNTER — Ambulatory Visit (HOSPITAL_BASED_OUTPATIENT_CLINIC_OR_DEPARTMENT_OTHER): Payer: BC Managed Care – PPO | Admitting: Family

## 2014-08-07 DIAGNOSIS — N529 Male erectile dysfunction, unspecified: Secondary | ICD-10-CM

## 2014-08-07 DIAGNOSIS — Q210 Ventricular septal defect: Secondary | ICD-10-CM

## 2014-08-07 DIAGNOSIS — I5032 Chronic diastolic (congestive) heart failure: Secondary | ICD-10-CM

## 2014-08-07 LAB — CBC WITH DIFFERENTIAL (CANCER CENTER ONLY)
BASO#: 0 10*3/uL (ref 0.0–0.2)
BASO%: 0.3 % (ref 0.0–2.0)
EOS ABS: 0.4 10*3/uL (ref 0.0–0.5)
EOS%: 4.8 % (ref 0.0–7.0)
HCT: 50.1 % — ABNORMAL HIGH (ref 38.7–49.9)
HGB: 17.6 g/dL — ABNORMAL HIGH (ref 13.0–17.1)
LYMPH#: 1.5 10*3/uL (ref 0.9–3.3)
LYMPH%: 21.1 % (ref 14.0–48.0)
MCH: 31.9 pg (ref 28.0–33.4)
MCHC: 35.1 g/dL (ref 32.0–35.9)
MCV: 91 fL (ref 82–98)
MONO#: 0.7 10*3/uL (ref 0.1–0.9)
MONO%: 10.1 % (ref 0.0–13.0)
NEUT#: 4.7 10*3/uL (ref 1.5–6.5)
NEUT%: 63.7 % (ref 40.0–80.0)
PLATELETS: 238 10*3/uL (ref 145–400)
RBC: 5.52 10*6/uL (ref 4.20–5.70)
RDW: 13.2 % (ref 11.1–15.7)
WBC: 7.3 10*3/uL (ref 4.0–10.0)

## 2014-08-07 LAB — CMP (CANCER CENTER ONLY)
ALBUMIN: 3.6 g/dL (ref 3.3–5.5)
ALT(SGPT): 47 U/L (ref 10–47)
AST: 19 U/L (ref 11–38)
Alkaline Phosphatase: 56 U/L (ref 26–84)
BUN: 12 mg/dL (ref 7–22)
CALCIUM: 9.2 mg/dL (ref 8.0–10.3)
CHLORIDE: 100 meq/L (ref 98–108)
CO2: 25 meq/L (ref 18–33)
Creat: 1.2 mg/dl (ref 0.6–1.2)
Glucose, Bld: 147 mg/dL — ABNORMAL HIGH (ref 73–118)
POTASSIUM: 3.6 meq/L (ref 3.3–4.7)
Sodium: 140 mEq/L (ref 128–145)
Total Bilirubin: 0.9 mg/dl (ref 0.20–1.60)
Total Protein: 7.3 g/dL (ref 6.4–8.1)

## 2014-08-07 NOTE — Progress Notes (Signed)
Kevin Wiggins presents today for phlebotomy per MD orders. Phlebotomy procedure started at 1515 and ended at 1525. 536mL removed. Patient observed for 30 minutes after procedure without any incident. Patient tolerated procedure well. IV needle removed intact.

## 2014-08-07 NOTE — Patient Instructions (Signed)

## 2014-08-07 NOTE — Progress Notes (Signed)
[place]  [phone] [phone]  ID: [name] OB: [birth year] MR#: [id number] RAQ#:[id number] Patient Care Team: Mosie Lukes, MD as PCP - General (Family Medicine)  DIAGNOSIS: Hemochromatosis (homozygous for H63D mutation). Hypogonadism secondary to hemochromatosis. Erythrocytosis secondary to testosterone therapy. Ruptured right sinus of Valsalva aneurysm.  INTERVAL HISTORY: Mr. Shewell is here for a follow-up. He is feeling ok. He is a bit tired and becomes SOB with exertion. His Hct is 50.1 today. He denies fever, chills, n/v, cough, rash, headache, dizziness, chest pain, palpitations, abdominal pain, constipation, diarrhea, blood in urine or stool. No swelling, tenderness, numbness or tingling in his extremities. His appetite is good and he is drinking plenty of fluids.   CURRENT TREATMENT: 1. The patient status post cardiac surgery to repair the sinus of  Valsalva aneurysm. 2. Phlebotomy to keep hematocrit below 48%. 3. Testosterone 200 mg IM q.2 weeks. 4. Asprin 81 mg daily.   REVIEW OF SYSTEMS: All other 10 point review of systems is negative.   PAST MEDICAL HISTORY: Past Medical History  Diagnosis Date  . Refusal of blood transfusions as patient is Jehovah's Witness   . Diastolic CHF     a. Acute diastolic CHF 45/6256 in the setting of perimembranous VSD. Eval underway given mod pulm HTN and RV dilation.  . Borderline diabetic     a. A1C 6.6 in 07/2013 - needs to f/u PCP for likely full DM.  [name] Hemochromatosis     H63D homozyous mutation  . VSD (ventricular septal defect), perimembranous     a. Dx 07/2013 with significant L-R shunt.  . Pulmonary HTN     a. 07/2013: Moderate pulmonary HTN with RV dilitation.  [name] CAD (coronary artery disease)     a. 07/2013: mild nonobstructive CAD by cath 07/25/13 as part of eval for VSD/SOB.  . CKD (chronic kidney disease)   . RBBB   . LAFB (left anterior fascicular block)   . Hypogonadism  male   . H/O: hypothyroidism   . ED (erectile dysfunction)   . Leukocytosis     secondary to testosterone replacement  . Erythrocytosis     secondary to testosterone replacement    PAST SURGICAL HISTORY: Past Surgical History  Procedure Laterality Date  . Tee without cardioversion N/A 07/25/2013    Procedure: TRANSESOPHAGEAL ECHOCARDIOGRAM (TEE);  Surgeon: Dorothy Spark, MD;  Location: [facility] ENDOSCOPY;  Service: Cardiovascular;  Laterality: N/A;  . Phlebotomy      FAMILY HISTORY Family History  Problem Relation Age of Onset  . Cancer Mother   . Hypertension Father   . Congestive Heart Failure Father   . Hemochromatosis      family history    GYNECOLOGIC HISTORY:  No LMP for male patient.   SOCIAL HISTORY: History   Social History  . Marital Status: Divorced    Spouse Name: N/A    Number of Children: N/A  . Years of Education: N/A   Occupational History  .      Maintenance    Social History Main Topics  . Smoking status: Never Smoker   . Smokeless tobacco: Never Used     Comment: never used tobacco  . Alcohol Use: No     Comment: socially  . Drug Use: No  . Sexual Activity: Not on file   Other Topics Concern  . Not on file   Social History Narrative  . No narrative on file    ADVANCED DIRECTIVES:  <no information>  HEALTH MAINTENANCE: History  Substance Use Topics  . Smoking status: Never Smoker   . Smokeless tobacco: Never Used     Comment: never used tobacco  . Alcohol Use: No     Comment: socially   Colonoscopy: PAP: Bone density: Lipid panel:  Allergies  Allergen Reactions  . Watermelon Concentrate Swelling    Current Outpatient Prescriptions  Medication Sig Dispense Refill  . aspirin 81 MG EC tablet Take 81 mg by mouth daily. Often forgets    . losartan (COZAAR) 50 MG tablet Take 1 tablet (50 mg total) by mouth daily. 30 tablet 1  . tadalafil (CIALIS) 20 MG tablet Take 1 tablet (20 mg total) by mouth daily as needed for  erectile dysfunction. 10 tablet 2  . testosterone cypionate (DEPOTESTOTERONE CYPIONATE) 200 MG/ML injection Inject 1.5 mLs (300 mg total) into the muscle once. 20 mL 3   No current facility-administered medications for this visit.    OBJECTIVE: Filed Vitals:   08/07/14 1449  BP: 154/69  Pulse: 89  Temp: 98 F (36.7 C)    Filed Weights   08/07/14 1449  Weight: 275 lb (124.739 kg)   ECOG FS:1 - Symptomatic but completely ambulatory Ocular: Sclerae unicteric, pupils equal, round and reactive to light Ear-nose-throat: Oropharynx clear, dentition fair Lymphatic: No cervical or supraclavicular adenopathy Lungs no rales or rhonchi, good excursion bilaterally Heart regular rate and rhythm, no murmur appreciated Abd soft, nontender, positive bowel sounds MSK no focal spinal tenderness, no joint edema Neuro: non-focal, well-oriented, appropriate affect  LAB RESULTS: CMP     Component Value Date/Time   NA 140 08/07/2014 1359   NA 138 06/29/2014 1358   K 3.6 08/07/2014 1359   K 4.2 06/29/2014 1358   CL 100 08/07/2014 1359   CL 101 06/29/2014 1358   CO2 25 08/07/2014 1359   CO2 23 06/29/2014 1358   GLUCOSE 147* 08/07/2014 1359   GLUCOSE 175* 06/29/2014 1358   BUN 12 08/07/2014 1359   BUN 13 06/29/2014 1358   CREATININE 1.2 08/07/2014 1359   CREATININE 1.17 06/29/2014 1358   CALCIUM 9.2 08/07/2014 1359   CALCIUM 9.8 06/29/2014 1358   PROT 7.3 08/07/2014 1359   PROT 7.1 06/29/2014 1358   ALBUMIN 4.1 06/29/2014 1358   AST 19 08/07/2014 1359   AST 28 06/29/2014 1358   ALT 47 08/07/2014 1359   ALT 50 06/29/2014 1358   ALKPHOS 56 08/07/2014 1359   ALKPHOS 54 06/29/2014 1358   BILITOT 0.90 08/07/2014 1359   BILITOT 0.7 06/29/2014 1358   GFRNONAA 51* 07/29/2013 0545   GFRAA 60* 07/29/2013 0545   INo results found for: SPEP, UPEP Lab Results  Component Value Date   WBC 7.3 08/07/2014   NEUTROABS 4.7 08/07/2014   HGB 17.6* 08/07/2014   HCT 50.1* 08/07/2014   MCV 91  08/07/2014   PLT 238 08/07/2014   No results found for: LABCA2 No components found for: [id number] No results for input(s): INR in the last 168 hours.  STUDIES: No results found.  ASSESSMENT/PLAN: Mr. Landers is 53 year old male with hemochromatosis. He is symptomatic today with fatigue and SOB.  We will phlebotomize him today. His Hct is 50.1.  We will see what the rest of his labs show.  We will plan to get him back in 5 weeks for labs and follow-up.  He knows to call here with any questions or concerns and to go to the ED in the event of an emergency. We can certainly see him  sooner if need be.   Eliezer Bottom, NP 08/07/2014 3:25 PM

## 2014-08-08 LAB — TESTOSTERONE: TESTOSTERONE: 251 ng/dL — AB (ref 300–890)

## 2014-08-08 LAB — IRON AND TIBC CHCC
%SAT: 41 % (ref 20–55)
Iron: 141 ug/dL (ref 42–163)
TIBC: 340 ug/dL (ref 202–409)
UIBC: 199 ug/dL (ref 117–376)

## 2014-08-08 LAB — FERRITIN CHCC: Ferritin: 23 ng/ml (ref 22–316)

## 2014-08-20 IMAGING — CT CT HEART MORPH/PULM VEIN W/ CM & W/O CA SCORE
1 of 10 series · 3 of 20 positions shown, 4 images · non-contrast
Comparison: No priors.

ADDENDUM:
Exam:  Coronary CT angiogram with calcium score.
INDICATION: Left to right shunting on right heart cath. Small VSD.
Assess for anomalous pulmonary veins, evidence for sinus venosus
defect.

[Series 14: w/ edge corr., 73.0% · axial · 0.49mm/px · z∈[-339,-122]mm · 3 of 484 slices shown, 4 images]
[im 1/484  vessel]
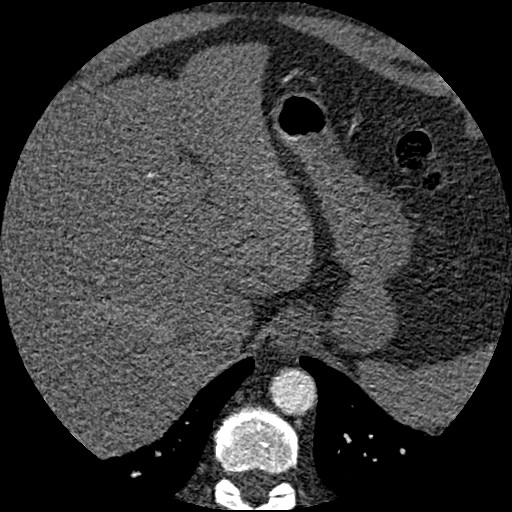
[im 1/484  lung]
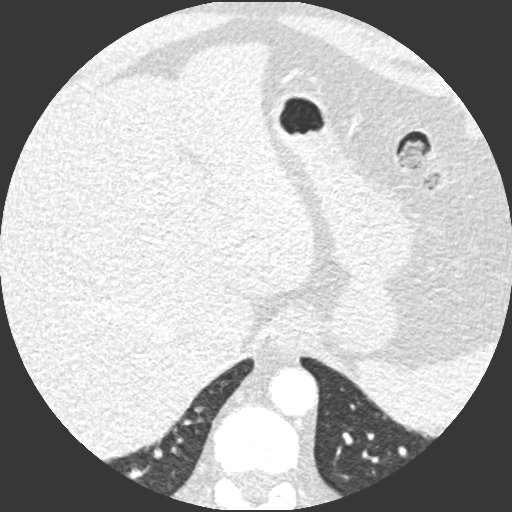
[im 242/484  vessel]
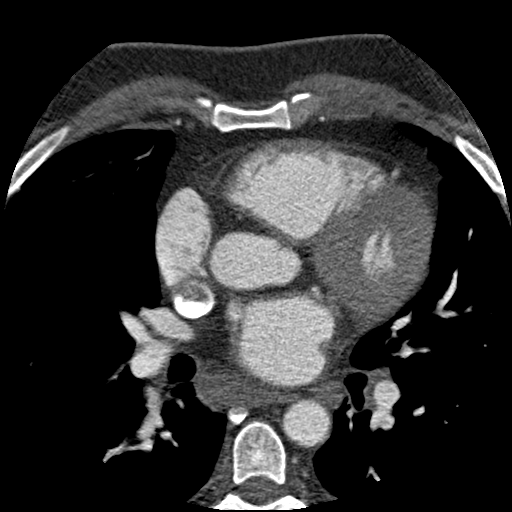
[im 484/484  vessel]
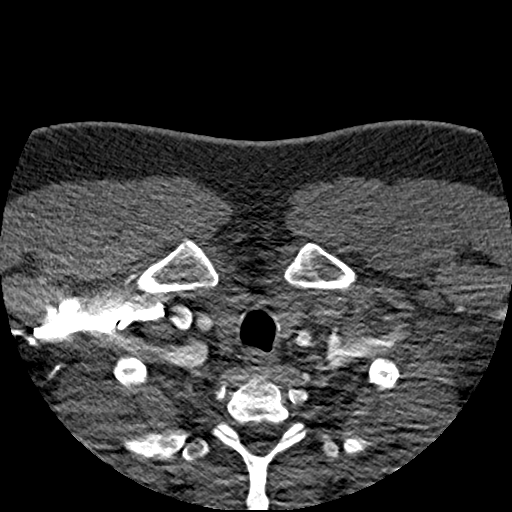

[3 of 20 positions shown; findings below may reference images not displayed]

Exam description: The patient was hydrated in [REDACTED] prior to
exam. [place] [REDACTED]ice CT. HR 65 bpm at the time of the exam. Scout
films done initially followed by noncontrast CT for calcium scoring.
Following this, coronary CT angiogram was done at 120 kV with
prospective gating and 5% phase tolerance. 80 cc contrast was used
with bolus tracking in the descending thoracic aorta.
FINDINGS: 1. Coronary arteries: Right dominant. There was mixed plaque in the
proximal RCA with mild stenosis. There was no significant plaque
noted in the remainder of the RCA. Artifact made interpretation of
the mid RCA difficult. Left main had no plaque or stenosis. There
was mixed plaque in the proximal left anterior descending coronary
artery with no stenosis. There was no obvious plaque or stenosis in
the remainder of the LAD. There was a small ramus with no disease.
The left circumflex did not have obvious plaque or stenosis. The
distal LCx was poorly visualized due to artifact.

2. Cardiac structure: There appeared to be a 1.3 cm perimembranous
VSD. The LV appeared to be normal in size, the RV appeared
moderately dilated. The RA appeared moderately dilated. No ASD
(including no sinus venosus defect) was noted.

3. Thoracic vasculature: The pulmonary veins drained normally to the
left atrium (2 right, 2 left). The thoracic aorta was normal in
caliber with normal great vessel origins. The SVC and IVC entered
the right atrium normally.

4.  Calcium score 21.9 Agatston units.
IMPRESSION: 1. No obstructive coronary disease though evaluation of mid RCA and
distal LCx was limited by artifact.

2.  No anomalous pulmonary venous return.

3.  Perimembranous VSD, appeared to be about 1.3 cm in diameter.

4.  No ASD noted.

5. Coronary artery calcium score 21.9 Agatston units places the
patient in the 61st percentile for age and gender. This suggests
intermediate risk of future cardiac events.

6.  Noncardiac findings reported by [REDACTED].

ADDENDUM:
Upon further review of study, the right sinus of valsalva is
elongated, measuring about 4.3 cm. There is a discontinuity
suggesting right sinus of Valsalva rupture. Compare to LESYA.

EXAM:
OVER-READ INTERPRETATION  CT CHEST

The following report is an over-read performed by radiologist Dr.
over-read does not include interpretation of cardiac or coronary
anatomy or pathology. The coronary calcium score and cardiac CTA
interpretation by the cardiologist is attached.
FINDINGS: Within the visualized portions of the lungs there is a small amount
of interlobular septal thickening and some patchy areas of very mild
ground-glass attenuation, which may represent a trace amount of
interstitial edema. No acute consolidative airspace disease. No
pleural effusions. No suspicious appearing pulmonary nodules or
masses are noted within the visualized portions of the thorax.
Visualized portions of the upper abdomen are unremarkable. No
aggressive appearing lytic or blastic lesions are noted in the
visualized portions of the skeleton.
IMPRESSION: 1. The appearance of the lung parenchyma suggest very mild
interstitial pulmonary edema.
2. No other significant incidental noncardiac findings noted.

## 2014-09-13 ENCOUNTER — Other Ambulatory Visit: Payer: Self-pay | Admitting: Cardiology

## 2014-09-13 ENCOUNTER — Encounter: Payer: Self-pay | Admitting: Hematology & Oncology

## 2014-09-13 ENCOUNTER — Ambulatory Visit (HOSPITAL_BASED_OUTPATIENT_CLINIC_OR_DEPARTMENT_OTHER): Payer: BC Managed Care – PPO

## 2014-09-13 ENCOUNTER — Other Ambulatory Visit (HOSPITAL_BASED_OUTPATIENT_CLINIC_OR_DEPARTMENT_OTHER): Payer: BC Managed Care – PPO | Admitting: Lab

## 2014-09-13 ENCOUNTER — Ambulatory Visit (HOSPITAL_BASED_OUTPATIENT_CLINIC_OR_DEPARTMENT_OTHER): Payer: BC Managed Care – PPO | Admitting: Hematology & Oncology

## 2014-09-13 DIAGNOSIS — N2889 Other specified disorders of kidney and ureter: Secondary | ICD-10-CM

## 2014-09-13 DIAGNOSIS — E291 Testicular hypofunction: Secondary | ICD-10-CM

## 2014-09-13 DIAGNOSIS — I151 Hypertension secondary to other renal disorders: Secondary | ICD-10-CM

## 2014-09-13 DIAGNOSIS — E349 Endocrine disorder, unspecified: Secondary | ICD-10-CM

## 2014-09-13 LAB — CBC WITH DIFFERENTIAL (CANCER CENTER ONLY)
BASO#: 0 10*3/uL (ref 0.0–0.2)
BASO%: 0.2 % (ref 0.0–2.0)
EOS%: 4.5 % (ref 0.0–7.0)
Eosinophils Absolute: 0.4 10*3/uL (ref 0.0–0.5)
HEMATOCRIT: 48.1 % (ref 38.7–49.9)
HEMOGLOBIN: 16.5 g/dL (ref 13.0–17.1)
LYMPH#: 1.5 10*3/uL (ref 0.9–3.3)
LYMPH%: 18.1 % (ref 14.0–48.0)
MCH: 29.9 pg (ref 28.0–33.4)
MCHC: 34.3 g/dL (ref 32.0–35.9)
MCV: 87 fL (ref 82–98)
MONO#: 0.8 10*3/uL (ref 0.1–0.9)
MONO%: 9.7 % (ref 0.0–13.0)
NEUT%: 67.5 % (ref 40.0–80.0)
NEUTROS ABS: 5.8 10*3/uL (ref 1.5–6.5)
Platelets: 245 10*3/uL (ref 145–400)
RBC: 5.51 10*6/uL (ref 4.20–5.70)
RDW: 13.2 % (ref 11.1–15.7)
WBC: 8.5 10*3/uL (ref 4.0–10.0)

## 2014-09-13 NOTE — Progress Notes (Signed)
Hematology and Oncology Follow Up Visit  Kevin Wiggins [id number] [birth year] 53 y.o. 09/13/2014   Principle Diagnosis:  1.  Hemochromatosis (homozygous for H63D mutation). 2. Hypogonadism secondary to hemochromatosis. 3. Erythrocytosis secondary to testosterone therapy. 4. Ruptured right sinus of Valsalva aneurysm.  Current Therapy:   1. The patient status post cardiac surgery to repair the sinus of     Valsalva aneurysm. 2. Phlebotomy to keep hematocrit below 48%. 3. Testosterone 200 mg IM q.2 weeks.     Interim History:  [name] is back for followup. Things are working out for him to be well right now. He is back with his parents because of his mom's illness. She is doing better.  He has his new girlfriend. He she was friends of his a while back. They have had a really nice relationship. They went to his daughter's home in [place] for Thanksgiving. We had a nice time. His daughter seems to like her a lot.   He really has a good quality of life right now. He really does not have to take the testosterone all that much. He is on Cialis. This certainly has helped him.  His blood pressure is still a little on the high side.  He does take aspirin.   Medications: Current outpatient prescriptions: aspirin 81 MG EC tablet, Take 81 mg by mouth daily. Often forgets, Disp: , Rfl: ;  losartan (COZAAR) 50 MG tablet, TAKE 1 TABLET (50 MG) BY MOUTH DAILY., Disp: 30 tablet, Rfl: 0;  tadalafil (CIALIS) 20 MG tablet, Take 1 tablet (20 mg total) by mouth daily as needed for erectile dysfunction., Disp: 10 tablet, Rfl: 2 testosterone cypionate (DEPOTESTOTERONE CYPIONATE) 200 MG/ML injection, Inject 1.5 mLs (300 mg total) into the muscle once., Disp: 20 mL, Rfl: 3  Allergies:  Allergies  Allergen Reactions  . Watermelon Concentrate Swelling    Past Medical History, Surgical history, Social history, and Family History were reviewed and updated.  Review of Systems: As above  Physical Exam:  height is 5\' 9"  (1.753 m) and weight is 278 lb (126.1 kg). His oral temperature is 98.1 F (36.7 C). His blood pressure is 146/93 and his pulse is 87. His respiration is 20.   Well-developed and well-nourished white showman. Head and neck exam shows no for or oral lesions. He has no conjunctival inflammation. He has no palpable cervical or supraclavicular lymph nodes. Lungs are clear. Cardiac exam regular rate and rhythm with no murmurs rubs or bruits. Abdomen is soft. There is no fluid wave. There is no palpable liver or spleen tip. Back exam shows no tenderness over the spine ribs or hips. Extremities shows no clubbing cyanosis or edema. Neurological exam shows no focal neurological deficits. Skin exam no rashes, ecchymoses or petechia.   Lab Results  Component Value Date   WBC 8.5 09/13/2014   HGB 16.5 09/13/2014   HCT 48.1 09/13/2014   MCV 87 09/13/2014   PLT 245 09/13/2014     Chemistry      Component Value Date/Time   NA 140 08/07/2014 1359   NA 138 06/29/2014 1358   K 3.6 08/07/2014 1359   K 4.2 06/29/2014 1358   CL 100 08/07/2014 1359   CL 101 06/29/2014 1358   CO2 25 08/07/2014 1359   CO2 23 06/29/2014 1358   BUN 12 08/07/2014 1359   BUN 13 06/29/2014 1358   CREATININE 1.2 08/07/2014 1359   CREATININE 1.17 06/29/2014 1358      Component Value Date/Time  CALCIUM 9.2 08/07/2014 1359   CALCIUM 9.8 06/29/2014 1358   ALKPHOS 56 08/07/2014 1359   ALKPHOS 54 06/29/2014 1358   AST 19 08/07/2014 1359   AST 28 06/29/2014 1358   ALT 47 08/07/2014 1359   ALT 50 06/29/2014 1358   BILITOT 0.90 08/07/2014 1359   BILITOT 0.7 06/29/2014 1358         Impression and Plan: Kevin Wiggins is 53 year old white male.[name] He has the homozygous mutation for hemochromatosis.   We will go ahead and phlebotomize him. His hemoglobin is a little on the higher side. We do this for the erythrocytosis that he has from the testosterone. This helps with his ferritin. His last ferritin back in November  was 23.   We will plan to get him back in another 6 weeks or so.  [name], MD 12/9/20155:16 PM

## 2014-09-13 NOTE — Patient Instructions (Signed)

## 2014-09-13 NOTE — Progress Notes (Signed)
Kevin Wiggins presents today for phlebotomy per MD orders. Phlebotomy procedure started at 1608 and ended at 1617. 567mls removed. Patient observed for 30 minutes after procedure without any incident. Patient tolerated procedure well. IV needle removed intact.

## 2014-09-14 ENCOUNTER — Encounter (HOSPITAL_COMMUNITY): Payer: Self-pay | Admitting: Internal Medicine

## 2014-09-14 LAB — COMPREHENSIVE METABOLIC PANEL
ALBUMIN: 4.2 g/dL (ref 3.5–5.2)
ALT: 43 U/L (ref 0–53)
AST: 25 U/L (ref 0–37)
Alkaline Phosphatase: 59 U/L (ref 39–117)
BUN: 14 mg/dL (ref 6–23)
CALCIUM: 9.7 mg/dL (ref 8.4–10.5)
CHLORIDE: 100 meq/L (ref 96–112)
CO2: 26 meq/L (ref 19–32)
CREATININE: 1.21 mg/dL (ref 0.50–1.35)
Glucose, Bld: 112 mg/dL — ABNORMAL HIGH (ref 70–99)
POTASSIUM: 4.2 meq/L (ref 3.5–5.3)
Sodium: 137 mEq/L (ref 135–145)
Total Bilirubin: 0.8 mg/dL (ref 0.2–1.2)
Total Protein: 7.4 g/dL (ref 6.0–8.3)

## 2014-09-14 LAB — IRON AND TIBC CHCC
%SAT: 17 % — ABNORMAL LOW (ref 20–55)
Iron: 65 ug/dL (ref 42–163)
TIBC: 380 ug/dL (ref 202–409)
UIBC: 315 ug/dL (ref 117–376)

## 2014-09-14 LAB — FERRITIN CHCC: FERRITIN: 16 ng/mL — AB (ref 22–316)

## 2014-09-14 LAB — PSA: PSA: 0.67 ng/mL (ref <=4.00)

## 2014-09-14 LAB — TESTOSTERONE: Testosterone: 343 ng/dL (ref 300–890)

## 2014-09-21 ENCOUNTER — Other Ambulatory Visit: Payer: Self-pay | Admitting: Nurse Practitioner

## 2014-10-20 ENCOUNTER — Encounter: Payer: Self-pay | Admitting: Hematology & Oncology

## 2014-10-20 ENCOUNTER — Telehealth: Payer: Self-pay | Admitting: Hematology & Oncology

## 2014-10-20 ENCOUNTER — Ambulatory Visit (HOSPITAL_BASED_OUTPATIENT_CLINIC_OR_DEPARTMENT_OTHER): Payer: BLUE CROSS/BLUE SHIELD

## 2014-10-20 ENCOUNTER — Ambulatory Visit (HOSPITAL_BASED_OUTPATIENT_CLINIC_OR_DEPARTMENT_OTHER): Payer: BLUE CROSS/BLUE SHIELD | Admitting: Hematology & Oncology

## 2014-10-20 ENCOUNTER — Other Ambulatory Visit (HOSPITAL_BASED_OUTPATIENT_CLINIC_OR_DEPARTMENT_OTHER): Payer: BLUE CROSS/BLUE SHIELD | Admitting: Lab

## 2014-10-20 DIAGNOSIS — N2889 Other specified disorders of kidney and ureter: Secondary | ICD-10-CM

## 2014-10-20 DIAGNOSIS — E349 Endocrine disorder, unspecified: Secondary | ICD-10-CM

## 2014-10-20 DIAGNOSIS — I151 Hypertension secondary to other renal disorders: Secondary | ICD-10-CM

## 2014-10-20 LAB — CBC WITH DIFFERENTIAL (CANCER CENTER ONLY)
BASO#: 0 10*3/uL (ref 0.0–0.2)
BASO%: 0.1 % (ref 0.0–2.0)
EOS%: 5.1 % (ref 0.0–7.0)
Eosinophils Absolute: 0.4 10*3/uL (ref 0.0–0.5)
HEMATOCRIT: 49 % (ref 38.7–49.9)
HGB: 16.7 g/dL (ref 13.0–17.1)
LYMPH#: 1.2 10*3/uL (ref 0.9–3.3)
LYMPH%: 17.4 % (ref 14.0–48.0)
MCH: 29.1 pg (ref 28.0–33.4)
MCHC: 34.1 g/dL (ref 32.0–35.9)
MCV: 86 fL (ref 82–98)
MONO#: 0.7 10*3/uL (ref 0.1–0.9)
MONO%: 9.4 % (ref 0.0–13.0)
NEUT#: 4.8 10*3/uL (ref 1.5–6.5)
NEUT%: 68 % (ref 40.0–80.0)
PLATELETS: 229 10*3/uL (ref 145–400)
RBC: 5.73 10*6/uL — ABNORMAL HIGH (ref 4.20–5.70)
RDW: 13.9 % (ref 11.1–15.7)
WBC: 7.1 10*3/uL (ref 4.0–10.0)

## 2014-10-20 LAB — COMPREHENSIVE METABOLIC PANEL
ALK PHOS: 52 U/L (ref 39–117)
ALT: 55 U/L — ABNORMAL HIGH (ref 0–53)
AST: 30 U/L (ref 0–37)
Albumin: 3.9 g/dL (ref 3.5–5.2)
BUN: 14 mg/dL (ref 6–23)
CALCIUM: 9.3 mg/dL (ref 8.4–10.5)
CHLORIDE: 100 meq/L (ref 96–112)
CO2: 23 mEq/L (ref 19–32)
CREATININE: 1.13 mg/dL (ref 0.50–1.35)
Glucose, Bld: 152 mg/dL — ABNORMAL HIGH (ref 70–99)
Potassium: 3.9 mEq/L (ref 3.5–5.3)
SODIUM: 134 meq/L — AB (ref 135–145)
TOTAL PROTEIN: 7.2 g/dL (ref 6.0–8.3)
Total Bilirubin: 0.6 mg/dL (ref 0.2–1.2)

## 2014-10-20 LAB — IRON AND TIBC
%SAT: 52 % (ref 20–55)
Iron: 175 ug/dL — ABNORMAL HIGH (ref 42–165)
TIBC: 339 ug/dL (ref 215–435)
UIBC: 164 ug/dL (ref 125–400)

## 2014-10-20 LAB — FERRITIN: Ferritin: 24 ng/mL (ref 22–322)

## 2014-10-20 NOTE — Progress Notes (Signed)
Hematology and Oncology Follow Up Visit  Kevin Wiggins [id number] Apr 30, 1961 54 y.o. 10/20/2014   Principle Diagnosis:  1.  Hemochromatosis (homozygous for H63D mutation). 2. Hypogonadism secondary to hemochromatosis. 3. Erythrocytosis secondary to testosterone therapy. 4. Ruptured right sinus of Valsalva aneurysm.  Current Therapy:   1. The patient status post cardiac surgery to repair the sinus of     Valsalva aneurysm. 2. Phlebotomy to keep hematocrit below 48%. 3. Testosterone 200 mg IM q.2 weeks.     Interim History:  Mr.  Kevin Wiggins is back for followup. He had a very good Christmas. He was at home. Things are working very well with him with his new girlfriend.  He will had no specific complaints. He is taking his medications.  He's had no nausea vomiting. There's been no headache. He's had no rashes. He's had no pruritus. There's been no change in bowel or bladder habits.  We last saw him back in December, his ferritin was 16. I saturation was 17%. His testosterone level was 343.  His last PSA was 0.67. This was done back in December 2015.  Medications:  Current outpatient prescriptions:  .  aspirin 81 MG EC tablet, Take 81 mg by mouth daily. Often forgets, Disp: , Rfl:  .  losartan (COZAAR) 50 MG tablet, TAKE 1 TABLET (50 MG) BY MOUTH DAILY., Disp: 30 tablet, Rfl: 0 .  tadalafil (CIALIS) 20 MG tablet, Take 1 tablet (20 mg total) by mouth daily as needed for erectile dysfunction., Disp: 10 tablet, Rfl: 2 .  testosterone cypionate (DEPOTESTOTERONE CYPIONATE) 200 MG/ML injection, Inject 1.5 mLs (300 mg total) into the muscle once., Disp: 20 mL, Rfl: 3  Allergies:  Allergies  Allergen Reactions  . Watermelon Concentrate Swelling    Past Medical History, Surgical history, Social history, and Family History were reviewed and updated.  Review of Systems: As above  Physical Exam:  height is 5\' 9"  (1.753 m) and weight is 280 lb (127.007 kg). His oral temperature is 98 F (36.7  C). His blood pressure is 143/81 and his pulse is 98. His respiration is 20.   Well-developed and well-nourished white showman. Head and neck exam shows no for or oral lesions. He has no conjunctival inflammation. He has no palpable cervical or supraclavicular lymph nodes. Lungs are clear. Cardiac exam regular rate and rhythm with no murmurs rubs or bruits. Abdomen is soft. There is no fluid wave. There is no palpable liver or spleen tip. Back exam shows no tenderness over the spine ribs or hips. Extremities shows no clubbing cyanosis or edema. Neurological exam shows no focal neurological deficits. Skin exam no rashes, ecchymoses or petechia.   Lab Results  Component Value Date   WBC 7.1 10/20/2014   HGB 16.7 10/20/2014   HCT 49.0 10/20/2014   MCV 86 10/20/2014   PLT 229 10/20/2014     Chemistry      Component Value Date/Time   NA 137 09/13/2014 1424   NA 140 08/07/2014 1359   K 4.2 09/13/2014 1424   K 3.6 08/07/2014 1359   CL 100 09/13/2014 1424   CL 100 08/07/2014 1359   CO2 26 09/13/2014 1424   CO2 25 08/07/2014 1359   BUN 14 09/13/2014 1424   BUN 12 08/07/2014 1359   CREATININE 1.21 09/13/2014 1424   CREATININE 1.2 08/07/2014 1359      Component Value Date/Time   CALCIUM 9.7 09/13/2014 1424   CALCIUM 9.2 08/07/2014 1359   ALKPHOS 59 09/13/2014 1424  ALKPHOS 56 08/07/2014 1359   AST 25 09/13/2014 1424   AST 19 08/07/2014 1359   ALT 43 09/13/2014 1424   ALT 47 08/07/2014 1359   BILITOT 0.8 09/13/2014 1424   BILITOT 0.90 08/07/2014 1359         Impression and Plan: Kevin Wiggins is 54 year old white male.Kevin Wiggins He has a homozygous mutation for one of them minor genes for hemochromatosis.   We will go ahead and phlebotomize him. His hemoglobin is on the higher side. We do this for the erythrocytosis that he has from the testosterone. This helps with his ferritin. His last ferritin back in November was 23.   I'm happy that he is doing well with his new girlfriend. This  really is helping his quality of life.  We will plan to get him back in another 4 weeks or so.  Kevin Napoleon, MD 1/15/20163:28 PM

## 2014-10-20 NOTE — Telephone Encounter (Signed)
Per MD ok to schedule pt 5 weeks instead of 4

## 2014-10-20 NOTE — Progress Notes (Signed)
Kevin Wiggins presents today for phlebotomy per MD orders. Phlebotomy procedure started at 1505 and ended at 1520. 524mL removed. Patient observed for 30 minutes after procedure without any incident. Patient tolerated procedure well. IV needle removed intact.

## 2014-10-20 NOTE — Patient Instructions (Signed)

## 2014-10-21 LAB — TESTOSTERONE: Testosterone: 222 ng/dL — ABNORMAL LOW (ref 300–890)

## 2014-11-08 ENCOUNTER — Other Ambulatory Visit: Payer: Self-pay | Admitting: Cardiology

## 2014-11-22 ENCOUNTER — Ambulatory Visit (HOSPITAL_BASED_OUTPATIENT_CLINIC_OR_DEPARTMENT_OTHER): Payer: BLUE CROSS/BLUE SHIELD

## 2014-11-22 ENCOUNTER — Encounter: Payer: Self-pay | Admitting: Hematology & Oncology

## 2014-11-22 ENCOUNTER — Ambulatory Visit (HOSPITAL_BASED_OUTPATIENT_CLINIC_OR_DEPARTMENT_OTHER): Payer: BLUE CROSS/BLUE SHIELD | Admitting: Hematology & Oncology

## 2014-11-22 ENCOUNTER — Other Ambulatory Visit (HOSPITAL_BASED_OUTPATIENT_CLINIC_OR_DEPARTMENT_OTHER): Payer: BLUE CROSS/BLUE SHIELD | Admitting: Lab

## 2014-11-22 DIAGNOSIS — I10 Essential (primary) hypertension: Secondary | ICD-10-CM

## 2014-11-22 DIAGNOSIS — D751 Secondary polycythemia: Secondary | ICD-10-CM

## 2014-11-22 DIAGNOSIS — E291 Testicular hypofunction: Secondary | ICD-10-CM

## 2014-11-22 LAB — CBC WITH DIFFERENTIAL (CANCER CENTER ONLY)
BASO#: 0 10e3/uL (ref 0.0–0.2)
BASO%: 0.4 % (ref 0.0–2.0)
EOS%: 4 % (ref 0.0–7.0)
Eosinophils Absolute: 0.3 10e3/uL (ref 0.0–0.5)
HCT: 48 % (ref 38.7–49.9)
HGB: 16.5 g/dL (ref 13.0–17.1)
LYMPH#: 1.7 10e3/uL (ref 0.9–3.3)
LYMPH%: 21.3 % (ref 14.0–48.0)
MCH: 29.2 pg (ref 28.0–33.4)
MCHC: 34.4 g/dL (ref 32.0–35.9)
MCV: 85 fL (ref 82–98)
MONO#: 0.8 10e3/uL (ref 0.1–0.9)
MONO%: 10.4 % (ref 0.0–13.0)
NEUT#: 5 10e3/uL (ref 1.5–6.5)
NEUT%: 63.9 % (ref 40.0–80.0)
Platelets: 227 10e3/uL (ref 145–400)
RBC: 5.66 10e6/uL (ref 4.20–5.70)
RDW: 14.8 % (ref 11.1–15.7)
WBC: 7.8 10e3/uL (ref 4.0–10.0)

## 2014-11-22 LAB — COMPREHENSIVE METABOLIC PANEL (CC13)
ALT: 50 U/L (ref 0–55)
AST: 27 U/L (ref 5–34)
Albumin: 4.1 g/dL (ref 3.5–5.0)
Alkaline Phosphatase: 62 U/L (ref 40–150)
Anion Gap: 10 mEq/L (ref 3–11)
BILIRUBIN TOTAL: 0.51 mg/dL (ref 0.20–1.20)
BUN: 14.1 mg/dL (ref 7.0–26.0)
CO2: 24 meq/L (ref 22–29)
CREATININE: 1.1 mg/dL (ref 0.7–1.3)
Calcium: 9.7 mg/dL (ref 8.4–10.4)
Chloride: 104 mEq/L (ref 98–109)
EGFR: 75 mL/min/{1.73_m2} — AB (ref >90)
GLUCOSE: 127 mg/dL (ref 70–140)
Potassium: 4.4 mEq/L (ref 3.5–5.1)
Sodium: 138 mEq/L (ref 136–145)
Total Protein: 7.5 g/dL (ref 6.4–8.3)

## 2014-11-22 LAB — FERRITIN CHCC: Ferritin: 22 ng/ml (ref 22–316)

## 2014-11-22 LAB — TESTOSTERONE: Testosterone: 259 ng/dL — ABNORMAL LOW (ref 300–890)

## 2014-11-22 LAB — IRON AND TIBC CHCC
%SAT: 33 % (ref 20–55)
Iron: 121 ug/dL (ref 42–163)
TIBC: 364 ug/dL (ref 202–409)
UIBC: 243 ug/dL (ref 117–376)

## 2014-11-22 LAB — CHCC SATELLITE - SMEAR

## 2014-11-22 NOTE — Progress Notes (Signed)
Hematology and Oncology Follow Up Visit  Kevin Wiggins [id number] 02/17/1961 54 y.o. 11/22/2014   Principle Diagnosis:  1.  Hemochromatosis (homozygous for H63D mutation). 2. Hypogonadism secondary to hemochromatosis. 3. Erythrocytosis secondary to testosterone therapy. 4. Ruptured right sinus of Valsalva aneurysm.  Current Therapy:   1. The patient status post cardiac surgery to repair the sinus of     Valsalva aneurysm. 2. Phlebotomy to keep hematocrit below 48%. 3. Testosterone 200 mg IM q.2 weeks.     Interim History:  [name] is back for followup. He's doing pretty well. He's had no problems with headache. There's been no nausea or vomiting. He's had no cardiac issues. He's had no problems with blood pressure. He's taken his blood pressure medicine every day.  He is still working. He's having no problems at work.  He's on taking the testosterone once since we last saw him. He doesn't think that he needs this all the time.  He's had no issues with fatigue. He's had no rashes. He's had no leg swelling.  He's had no cough. There's been no shortness of breath.  He will try  to exercise more. He wants try to lose some weight. A gym is only 3 miles from where he lives.  Overall, his performance status is ECOG 1.  Medications:  Current outpatient prescriptions:  .  aspirin 81 MG EC tablet, Take 81 mg by mouth daily. Often forgets, Disp: , Rfl:  .  losartan (COZAAR) 50 MG tablet, TAKE 1 TABLET (50 MG) BY MOUTH DAILY. *PATIENT NEEDS AN APPT*, Disp: 90 tablet, Rfl: 0 .  tadalafil (CIALIS) 20 MG tablet, Take 1 tablet (20 mg total) by mouth daily as needed for erectile dysfunction., Disp: 10 tablet, Rfl: 2 .  testosterone cypionate (DEPOTESTOTERONE CYPIONATE) 200 MG/ML injection, Inject 1.5 mLs (300 mg total) into the muscle once., Disp: 20 mL, Rfl: 3  Allergies:  Allergies  Allergen Reactions  . Watermelon Concentrate Swelling    Past Medical History, Surgical history, Social  history, and Family History were reviewed and updated.  Review of Systems: As above  Physical Exam:  height is 5\' 9"  (1.753 m) and weight is 280 lb (127.007 kg). His oral temperature is 98.2 F (36.8 C). His blood pressure is 129/74 and his pulse is 94. His respiration is 20.   Well-developed and well-nourished white gentleman. Head and neck exam shows no ocular or oral lesions. He has no conjunctival inflammation. He has no palpable cervical or supraclavicular lymph nodes. Lungs are clear. Cardiac exam regular rate and rhythm with no murmurs rubs or bruits. Abdomen is soft. He is moderately obese. There is no fluid wave. There is no palpable liver or spleen tip. Back exam shows no tenderness over the spine ribs or hips. Extremities shows no clubbing cyanosis or edema. Neurological exam shows no focal neurological deficits. Skin exam no rashes, ecchymoses or petechia.   Lab Results  Component Value Date   WBC 7.8 11/22/2014   HGB 16.5 11/22/2014   HCT 48.0 11/22/2014   MCV 85 11/22/2014   PLT 227 11/22/2014     Chemistry      Component Value Date/Time   NA 134* 10/20/2014 1411   NA 140 08/07/2014 1359   K 3.9 10/20/2014 1411   K 3.6 08/07/2014 1359   CL 100 10/20/2014 1411   CL 100 08/07/2014 1359   CO2 23 10/20/2014 1411   CO2 25 08/07/2014 1359   BUN 14 10/20/2014 1411  BUN 12 08/07/2014 1359   CREATININE 1.13 10/20/2014 1411   CREATININE 1.2 08/07/2014 1359      Component Value Date/Time   CALCIUM 9.3 10/20/2014 1411   CALCIUM 9.2 08/07/2014 1359   ALKPHOS 52 10/20/2014 1411   ALKPHOS 56 08/07/2014 1359   AST 30 10/20/2014 1411   AST 19 08/07/2014 1359   ALT 55* 10/20/2014 1411   ALT 47 08/07/2014 1359   BILITOT 0.6 10/20/2014 1411   BILITOT 0.90 08/07/2014 1359         Impression and Plan: Kevin Wiggins is 54 year old white male.[name] He has a homozygous mutation for one of them minor genes for hemochromatosis.   We will go ahead and phlebotomize him. His hemoglobin  is on the higher side. We do this for the erythrocytosis that he has from the testosterone. This helps with his ferritin. His last ferritin back in November was 23.   I'm happy that he is doing well with his new girlfriend. This really is helping his quality of life.  We will plan to get him back in another 4 weeks or so.  [name], MD 2/17/20163:41 PM

## 2014-11-22 NOTE — Patient Instructions (Signed)

## 2014-11-22 NOTE — Progress Notes (Signed)
Kevin Wiggins presents today for phlebotomy per MD orders. Phlebotomy procedure started at 1515 and ended at 1525. 500 ml removed. Patient observed for 30 minutes after procedure without any incident. Patient tolerated procedure well. IV needle removed intact.

## 2014-12-27 ENCOUNTER — Encounter: Payer: Self-pay | Admitting: Hematology & Oncology

## 2014-12-27 ENCOUNTER — Ambulatory Visit (HOSPITAL_BASED_OUTPATIENT_CLINIC_OR_DEPARTMENT_OTHER): Payer: BLUE CROSS/BLUE SHIELD

## 2014-12-27 ENCOUNTER — Other Ambulatory Visit (HOSPITAL_BASED_OUTPATIENT_CLINIC_OR_DEPARTMENT_OTHER): Payer: BLUE CROSS/BLUE SHIELD | Admitting: Lab

## 2014-12-27 ENCOUNTER — Ambulatory Visit (HOSPITAL_BASED_OUTPATIENT_CLINIC_OR_DEPARTMENT_OTHER): Payer: BLUE CROSS/BLUE SHIELD | Admitting: Hematology & Oncology

## 2014-12-27 DIAGNOSIS — I10 Essential (primary) hypertension: Secondary | ICD-10-CM

## 2014-12-27 LAB — CMP (CANCER CENTER ONLY)
ALT(SGPT): 51 U/L — ABNORMAL HIGH (ref 10–47)
AST: 27 U/L (ref 11–38)
Albumin: 3.6 g/dL (ref 3.3–5.5)
Alkaline Phosphatase: 49 U/L (ref 26–84)
BUN: 15 mg/dL (ref 7–22)
CALCIUM: 9.1 mg/dL (ref 8.0–10.3)
CHLORIDE: 100 meq/L (ref 98–108)
CO2: 28 meq/L (ref 18–33)
CREATININE: 1.3 mg/dL — AB (ref 0.6–1.2)
GLUCOSE: 166 mg/dL — AB (ref 73–118)
POTASSIUM: 4.3 meq/L (ref 3.3–4.7)
SODIUM: 142 meq/L (ref 128–145)
Total Bilirubin: 0.8 mg/dl (ref 0.20–1.60)
Total Protein: 7.3 g/dL (ref 6.4–8.1)

## 2014-12-27 LAB — CBC WITH DIFFERENTIAL (CANCER CENTER ONLY)
BASO#: 0 10*3/uL (ref 0.0–0.2)
BASO%: 0.3 % (ref 0.0–2.0)
EOS ABS: 0.4 10*3/uL (ref 0.0–0.5)
EOS%: 5.6 % (ref 0.0–7.0)
HCT: 47.3 % (ref 38.7–49.9)
HEMOGLOBIN: 16.1 g/dL (ref 13.0–17.1)
LYMPH#: 1.5 10*3/uL (ref 0.9–3.3)
LYMPH%: 19.5 % (ref 14.0–48.0)
MCH: 28.8 pg (ref 28.0–33.4)
MCHC: 34 g/dL (ref 32.0–35.9)
MCV: 85 fL (ref 82–98)
MONO#: 0.6 10*3/uL (ref 0.1–0.9)
MONO%: 8.3 % (ref 0.0–13.0)
NEUT#: 5 10*3/uL (ref 1.5–6.5)
NEUT%: 66.3 % (ref 40.0–80.0)
Platelets: 232 10*3/uL (ref 145–400)
RBC: 5.6 10*6/uL (ref 4.20–5.70)
RDW: 15.3 % (ref 11.1–15.7)
WBC: 7.5 10*3/uL (ref 4.0–10.0)

## 2014-12-27 NOTE — Progress Notes (Signed)
Kevin Wiggins presents today for phlebotomy per MD orders. Phlebotomy procedure started at 1540 and ended at 1550. 500 grams removed. Patient observed for 30 minutes after procedure without any incident. Patient tolerated procedure well. IV needle removed intact.

## 2014-12-27 NOTE — Progress Notes (Signed)
Hematology and Oncology Follow Up Visit  Kevin Wiggins [id number] 1960/11/01 54 y.o. 12/27/2014   Principle Diagnosis:  1.  Hemochromatosis (homozygous for H63D mutation). 2. Hypogonadism secondary to hemochromatosis. 3. Erythrocytosis secondary to testosterone therapy. 4. Ruptured right sinus of Valsalva aneurysm.  Current Therapy:   1. The patient status post cardiac surgery to repair the sinus of     Valsalva aneurysm. 2. Phlebotomy to keep hematocrit below 48%. 3. Testosterone 200 mg IM q.2 weeks.     Interim History:  [name] is back for followup. He's doing fantastic. He now is engaged. He got Inda Merlin couple Fridays ago. He is very excited about his situation. I'm so happy for him.  He's had no problems healthwise. He still exercising. He's watching what he eats. He's trying to watch his weight.  We do phlebotomize him relatively frequently. I think a lot of this is secondary to his testosterone injections. I did this is causing some degree of erythrocytosis.  Hemochromatosis really has not been a problem for him.  Overall, his performance status is ECOG 1.  Medications:  Current outpatient prescriptions:  .  aspirin 81 MG EC tablet, Take 81 mg by mouth daily. Often forgets, Disp: , Rfl:  .  losartan (COZAAR) 50 MG tablet, TAKE 1 TABLET (50 MG) BY MOUTH DAILY. *PATIENT NEEDS AN APPT*, Disp: 90 tablet, Rfl: 0 .  tadalafil (CIALIS) 20 MG tablet, Take 1 tablet (20 mg total) by mouth daily as needed for erectile dysfunction., Disp: 10 tablet, Rfl: 2 .  testosterone cypionate (DEPOTESTOTERONE CYPIONATE) 200 MG/ML injection, Inject 1.5 mLs (300 mg total) into the muscle once., Disp: 20 mL, Rfl: 3  Allergies:  Allergies  Allergen Reactions  . Watermelon Concentrate Swelling    Past Medical History, Surgical history, Social history, and Family History were reviewed and updated.  Review of Systems: As above  Physical Exam:  height is 5\' 9"  (1.753 m) and weight is 283 lb  (128.368 kg). His oral temperature is 98.1 F (36.7 C). His blood pressure is 146/72 and his pulse is 98. His respiration is 20.   Well-developed and well-nourished white gentleman. Head and neck exam shows no ocular or oral lesions. He has no conjunctival inflammation. He has no palpable cervical or supraclavicular lymph nodes. Lungs are clear. Cardiac exam regular rate and rhythm with no murmurs rubs or bruits. Abdomen is soft. He is moderately obese. There is no fluid wave. There is no palpable liver or spleen tip. Back exam shows no tenderness over the spine ribs or hips. Extremities shows no clubbing cyanosis or edema. Neurological exam shows no focal neurological deficits. Skin exam no rashes, ecchymoses or petechia.   Lab Results  Component Value Date   WBC 7.5 12/27/2014   HGB 16.1 12/27/2014   HCT 47.3 12/27/2014   MCV 85 12/27/2014   PLT 232 12/27/2014     Chemistry      Component Value Date/Time   NA 142 12/27/2014 1401   NA 138 11/22/2014 1417   NA 134* 10/20/2014 1411   K 4.3 12/27/2014 1401   K 4.4 11/22/2014 1417   K 3.9 10/20/2014 1411   CL 100 12/27/2014 1401   CL 100 10/20/2014 1411   CO2 28 12/27/2014 1401   CO2 24 11/22/2014 1417   CO2 23 10/20/2014 1411   BUN 15 12/27/2014 1401   BUN 14.1 11/22/2014 1417   BUN 14 10/20/2014 1411   CREATININE 1.3* 12/27/2014 1401   CREATININE 1.1  11/22/2014 1417   CREATININE 1.13 10/20/2014 1411      Component Value Date/Time   CALCIUM 9.1 12/27/2014 1401   CALCIUM 9.7 11/22/2014 1417   CALCIUM 9.3 10/20/2014 1411   ALKPHOS 49 12/27/2014 1401   ALKPHOS 62 11/22/2014 1417   ALKPHOS 52 10/20/2014 1411   AST 27 12/27/2014 1401   AST 27 11/22/2014 1417   AST 30 10/20/2014 1411   ALT 51* 12/27/2014 1401   ALT 50 11/22/2014 1417   ALT 55* 10/20/2014 1411   BILITOT 0.80 12/27/2014 1401   BILITOT 0.51 11/22/2014 1417   BILITOT 0.6 10/20/2014 1411         Impression and Plan: Kevin Wiggins is 54 year old white male.Kevin Wiggins He  has a homozygous mutation for one of the minor genes for hemochromatosis.   We will go ahead and phlebotomize him. His hemoglobin is on the higher side. We do this for the erythrocytosis that he has from the testosterone. This helps with his ferritin. His last ferritin back in February was 22.   I'm happy that he is doing well and that he is now engaged. It sounds like the wedding will be in September.    We will plan to get him back in another 6    weeks or so.  [name], MD 3/23/20163:38 PM

## 2014-12-27 NOTE — Patient Instructions (Signed)

## 2014-12-27 NOTE — Patient Instructions (Signed)

## 2014-12-28 LAB — FERRITIN CHCC: Ferritin: 18 ng/ml — ABNORMAL LOW (ref 22–316)

## 2014-12-28 LAB — IRON AND TIBC CHCC
%SAT: 27 % (ref 20–55)
IRON: 96 ug/dL (ref 42–163)
TIBC: 350 ug/dL (ref 202–409)
UIBC: 254 ug/dL (ref 117–376)

## 2014-12-28 LAB — TESTOSTERONE: TESTOSTERONE: 235 ng/dL — AB (ref 300–890)

## 2015-01-05 ENCOUNTER — Ambulatory Visit (INDEPENDENT_AMBULATORY_CARE_PROVIDER_SITE_OTHER): Payer: BLUE CROSS/BLUE SHIELD | Admitting: Cardiology

## 2015-01-05 ENCOUNTER — Encounter: Payer: Self-pay | Admitting: Cardiology

## 2015-01-05 VITALS — BP 120/80 | HR 86 | Ht 69.0 in | Wt 287.0 lb

## 2015-01-05 DIAGNOSIS — G4733 Obstructive sleep apnea (adult) (pediatric): Secondary | ICD-10-CM | POA: Diagnosis not present

## 2015-01-05 DIAGNOSIS — I5032 Chronic diastolic (congestive) heart failure: Secondary | ICD-10-CM

## 2015-01-05 DIAGNOSIS — Q254 Other congenital malformations of aorta: Secondary | ICD-10-CM

## 2015-01-05 DIAGNOSIS — Q2543 Congenital aneurysm of aorta: Secondary | ICD-10-CM

## 2015-01-05 NOTE — Patient Instructions (Signed)
Your physician recommends that you have a  lipid profile today.  Your physician wants you to follow-up in: 1 year with Dr Aundra Dubin. (April 2017).  You will receive a reminder letter in the mail two months in advance. If you don't receive a letter, please call our office to schedule the follow-up appointment.

## 2015-01-06 LAB — LIPID PANEL
Cholesterol: 221 mg/dL — ABNORMAL HIGH (ref 0–200)
HDL: 37 mg/dL — ABNORMAL LOW (ref >=40)
LDL CALC: 154 mg/dL — AB (ref 0–99)
Total CHOL/HDL Ratio: 6 Ratio
Triglycerides: 149 mg/dL (ref <150)
VLDL: 30 mg/dL (ref 0–40)

## 2015-01-07 NOTE — Progress Notes (Signed)
Patient ID: Kevin Wiggins, male   DOB: 07-20-1961, 54 y.o.   MRN: [id number] PCP: Dr. Hassell Done  54 yo with history of hereditary hemochromatosis (closely followed by hematology with phlebotomies) and HTN presented to [facility] in 10/14 with symptoms consistent with CHF.  Dyspnea developed in early 10/14.  Prior to this, he had had no exertional symptoms.  In the hospital, he was noted to be volume overloaded and was diuresed.  He had a loud systolic murmur.  TTE showed normal LV systolic function with a moderately dilated RV.  He had what appeared to be a small peri-membranous VSD.  TEE showed that this actually was probably a ruptured right sinus of valsalva aneurysm.  He had a RHC/LHC done next given CHF.  This showed nonobstructive CAD and moderate pulmonary hypertension.  Qp/Qs was 2.4/1.  He had a coronary CTA, which showed no pulmonary vein anomalies. Patient was referred to Dr Darcus Austin for congenital cardiology evaluation.  He had patch repair of the ruptured SOV aneurysm in 11/14 at [facility].  Post-op course was uncomplicated.  Mr Pape has done quite well since his surgery.  He is back at work.  He is no longer taking Lasix.  Breathing is much better, no particular DOE now.  He can get up a flight of steps with no problems.  He was diagnosed by sleep study with severe OSA but did not tolerate CPAP.  He will be getting married soon.  Of note, weight is up 22 lbs.  He is currently living with his parents and says that he has been eating much more than in the past.    Labs (10/14): K 4.4, creatinine 1.7, BNP 1054 => 274, LDL 141 Labs (1/15): HCT 48, K 4, creatinine 1.1 Labs (3/16): K 4.3, creatinine 1.3, HCT 47.3  ECG: NSR, LAFB, RBBB  PMH: 1. Hemochromatosis: Followed by hematology, gets periodic phlebotomies, has been controlled.  2. HTN 3. CKD 4. Diastolic CHF 5. Ruptured sinus of valsalva aneurysm: Echo (10/14) with EF 60-65%, moderate RV dilation/normal function, small-appearing ?VSD (left to right  flow).  TEE (10/14) with EF 55-60%, moderately dilated RV, suspect ruptured sinus of valsalva aneurysm, trivial AI.  LHC/RHC (10/14) with nonobstructive mild CAD; mean RA 6, PA 56/22 mean 35, mean PCWP 18, PA 82%, IVC 67%, SVG 59%, CI 2.1, Qp/Qs 2.4/1.  He was unable to tolerate cardiac MRI/MRA due to claustrophobia.  Coronary CTA (10/14): Nonobstructive mild coronary disease, no pulmonary vein anomaly, enlarged right sinus of valsalva.  Patch repair of ruptured SOV aneurysm in 11/14 at [facility].  Echo (1/15) with EF 55-60%, mild LVH, stable aortic root s/p sinus of valsalva fistula repair.  6. CAD: Nonobstructive on 10/14 LHC.  Coronary CTA with calcium score 21.9 suggesting intermediate risk (61st percentile).  7. Hyperlipidemia 8. Hypogonadism: Related to hemochromatosis.  9. OSA: Severe by sleep study in 2/15.  Unable to wear CPAP.   SH: Single, lives in [place], [place], nonsmoker.   FH: Uncle with CHF  ROS: All systems reviewed and negative except as per HPI.   Current Outpatient Prescriptions  Medication Sig Dispense Refill  . aspirin 81 MG EC tablet Take 81 mg by mouth daily. Often forgets    . losartan (COZAAR) 50 MG tablet TAKE 1 TABLET (50 MG) BY MOUTH DAILY. *PATIENT NEEDS AN APPT* 90 tablet 0  . tadalafil (CIALIS) 20 MG tablet Take 1 tablet (20 mg total) by mouth daily as needed for erectile dysfunction. 10 tablet 2  .  testosterone cypionate (DEPOTESTOTERONE CYPIONATE) 200 MG/ML injection Inject 1.5 mLs (300 mg total) into the muscle once. 20 mL 3   No current facility-administered medications for this visit.    BP 120/80 mmHg  Pulse 86  Ht 5\' 9"  (1.753 m)  Wt 287 lb (130.182 kg)  BMI 42.36 kg/m2 General: NAD, obese Neck: Thick, JVP 7 cm, no thyromegaly or thyroid nodule.  Lungs: Clear to auscultation bilaterally with normal respiratory effort. CV: Nondisplaced PMI.  Heart regular S1/S2, no S3/S4, no murmur.  1+ ankle edema.  No carotid bruit.  Normal pedal  pulses.  Abdomen: Soft, nontender, no hepatosplenomegaly, no distention.  Skin: Intact without lesions or rashes.  Neurologic: Alert and oriented x 3.  Psych: Normal affect. Extremities: No clubbing or cyanosis.   Assessment/Plan: 1. Ruptured sinus of valsalva aneurysm: s/p patch repair at [facility] in 11/14.  He is doing much better symptomatically.  He does not have a significant murmur on exam.  Baseline echo post-op in 1/15 was stable.  Would use antibiotics with dental work.  2. Chronic diastolic CHF: Suspect this was related to LV volume load from ruptured SOV aneurysm, now resolved.   3. OSA: Severe by sleep study but he is unable to tolerate CPAP.   4. Hemochromatosis: Iron indices have been well-controlled.  He is followed by hematology.  He does not have a dilated cardiomyopathy.  He was unable to tolerate MRI to assess for myocardial hemochromatosis involvement.  5. Obesity: He needs weight loss.  We discussed diet and exercise.  I think that the weight gain is due to increased caloric intake and minimal exercise.  6. HTN: Side effects from amlodipine and cough with lisinopril.  BP now controlled with losartan.   [name] 01/07/2015

## 2015-01-09 ENCOUNTER — Telehealth: Payer: Self-pay | Admitting: Cardiology

## 2015-01-09 DIAGNOSIS — E785 Hyperlipidemia, unspecified: Secondary | ICD-10-CM

## 2015-01-09 DIAGNOSIS — I5032 Chronic diastolic (congestive) heart failure: Secondary | ICD-10-CM

## 2015-01-09 MED ORDER — ATORVASTATIN CALCIUM 20 MG PO TABS: 20.0000 mg | ORAL_TABLET | Freq: Every day | ORAL | Status: DC

## 2015-01-09 NOTE — Telephone Encounter (Signed)
Spoke with patient about recent lab results 

## 2015-01-09 NOTE — Telephone Encounter (Signed)
New message ° ° ° ° °Returning Anne's call °

## 2015-01-26 ENCOUNTER — Other Ambulatory Visit: Payer: Self-pay | Admitting: *Deleted

## 2015-01-26 DIAGNOSIS — N529 Male erectile dysfunction, unspecified: Secondary | ICD-10-CM

## 2015-01-26 DIAGNOSIS — Q210 Ventricular septal defect: Secondary | ICD-10-CM

## 2015-01-26 DIAGNOSIS — I5032 Chronic diastolic (congestive) heart failure: Secondary | ICD-10-CM

## 2015-01-26 MED ORDER — TESTOSTERONE CYPIONATE 200 MG/ML IM SOLN: 300.0000 mg | Freq: Once | INTRAMUSCULAR | Status: DC

## 2015-02-07 ENCOUNTER — Ambulatory Visit (HOSPITAL_BASED_OUTPATIENT_CLINIC_OR_DEPARTMENT_OTHER): Payer: BLUE CROSS/BLUE SHIELD | Admitting: Hematology & Oncology

## 2015-02-07 ENCOUNTER — Other Ambulatory Visit (HOSPITAL_BASED_OUTPATIENT_CLINIC_OR_DEPARTMENT_OTHER): Payer: BLUE CROSS/BLUE SHIELD

## 2015-02-07 ENCOUNTER — Encounter: Payer: Self-pay | Admitting: Hematology & Oncology

## 2015-02-07 DIAGNOSIS — E11319 Type 2 diabetes mellitus with unspecified diabetic retinopathy without macular edema: Secondary | ICD-10-CM

## 2015-02-07 LAB — CBC WITH DIFFERENTIAL (CANCER CENTER ONLY)
BASO#: 0 10*3/uL (ref 0.0–0.2)
BASO%: 0.2 % (ref 0.0–2.0)
EOS%: 5.3 % (ref 0.0–7.0)
Eosinophils Absolute: 0.5 10*3/uL (ref 0.0–0.5)
HEMATOCRIT: 46.9 % (ref 38.7–49.9)
HGB: 15.8 g/dL (ref 13.0–17.1)
LYMPH#: 1.7 10*3/uL (ref 0.9–3.3)
LYMPH%: 17.3 % (ref 14.0–48.0)
MCH: 28.5 pg (ref 28.0–33.4)
MCHC: 33.7 g/dL (ref 32.0–35.9)
MCV: 85 fL (ref 82–98)
MONO#: 0.8 10*3/uL (ref 0.1–0.9)
MONO%: 8.4 % (ref 0.0–13.0)
NEUT#: 6.8 10*3/uL — ABNORMAL HIGH (ref 1.5–6.5)
NEUT%: 68.8 % (ref 40.0–80.0)
Platelets: 281 10*3/uL (ref 145–400)
RBC: 5.55 10*6/uL (ref 4.20–5.70)
RDW: 15.4 % (ref 11.1–15.7)
WBC: 9.9 10*3/uL (ref 4.0–10.0)

## 2015-02-07 LAB — COMPREHENSIVE METABOLIC PANEL
ALK PHOS: 53 U/L (ref 39–117)
ALT: 48 U/L (ref 0–53)
AST: 27 U/L (ref 0–37)
Albumin: 4.2 g/dL (ref 3.5–5.2)
BILIRUBIN TOTAL: 0.6 mg/dL (ref 0.2–1.2)
BUN: 11 mg/dL (ref 6–23)
CO2: 29 mEq/L (ref 19–32)
CREATININE: 1.14 mg/dL (ref 0.50–1.35)
Calcium: 9.6 mg/dL (ref 8.4–10.5)
Chloride: 99 mEq/L (ref 96–112)
GLUCOSE: 161 mg/dL — AB (ref 70–99)
Potassium: 4.3 mEq/L (ref 3.5–5.3)
Sodium: 138 mEq/L (ref 135–145)
TOTAL PROTEIN: 7.2 g/dL (ref 6.0–8.3)

## 2015-02-07 NOTE — Progress Notes (Signed)
Hematology and Oncology Follow Up Visit  Kevin Wiggins [id number] [birth year] 54 y.o. 02/07/2015   Principle Diagnosis:  1.  Hemochromatosis (homozygous for H63D mutation). 2. Hypogonadism secondary to hemochromatosis. 3. Erythrocytosis secondary to testosterone therapy. 4. Ruptured right sinus of Valsalva aneurysm.  Current Therapy:   1. The patient status post cardiac surgery to repair the sinus of     Valsalva aneurysm. 2. Phlebotomy to keep hematocrit below 48%. 3. Testosterone 200 mg IM q.2 weeks.     Interim History:  Mr.  Kevin Wiggins is back for followup. He's doing fantastic. He now is engaged.  It sounds like he may actually get married over Kevin Wiggins Day weekend.    He's had no problems healthwise. He still exercising. He's watching what he eats. He's trying to watch his weight.   He did get a new tattoo on his left shoulder. He showed this to me. It is very impressive.  We do phlebotomize him relatively frequently. I think a lot of this is secondary to his testosterone injections. I  Do think that this is causing some degree of erythrocytosis.  Hemochromatosis really has not been a problem for him.  Overall, his performance status is ECOG 1.  Medications:  Current outpatient prescriptions:  .  aspirin 81 MG EC tablet, Take 81 mg by mouth daily. Often forgets, Disp: , Rfl:  .  atorvastatin (LIPITOR) 20 MG tablet, Take 1 tablet (20 mg total) by mouth daily., Disp: 30 tablet, Rfl: 3 .  losartan (COZAAR) 50 MG tablet, TAKE 1 TABLET (50 MG) BY MOUTH DAILY. *PATIENT NEEDS AN APPT*, Disp: 90 tablet, Rfl: 0 .  tadalafil (CIALIS) 20 MG tablet, Take 1 tablet (20 mg total) by mouth daily as needed for erectile dysfunction., Disp: 10 tablet, Rfl: 2 .  testosterone cypionate (DEPOTESTOTERONE CYPIONATE) 200 MG/ML injection, Inject 1.5 mLs (300 mg total) into the muscle once., Disp: 20 mL, Rfl: 3  Allergies:  Allergies  Allergen Reactions  . Watermelon Concentrate Swelling    Past  Medical History, Surgical history, Social history, and Family History were reviewed and updated.  Review of Systems: As above  Physical Exam:  height is 5\' 9"  (1.753 m) and weight is 289 lb (131.09 kg). His oral temperature is 98 F (36.7 C). His blood pressure is 141/74 and his pulse is 91. His respiration is 18.   Well-developed and well-nourished white gentleman. Head and neck exam shows no ocular or oral lesions. He has no conjunctival inflammation. He has no palpable cervical or supraclavicular lymph nodes. Lungs are clear. Cardiac exam regular rate and rhythm with no murmurs rubs or bruits. Abdomen is soft. He is moderately obese. There is no fluid wave. There is no palpable liver or spleen tip. Back exam shows no tenderness over the spine ribs or hips. Extremities shows no clubbing cyanosis or edema. Neurological exam shows no focal neurological deficits. Skin exam no rashes, ecchymoses or petechia.   Lab Results  Component Value Date   WBC 9.9 02/07/2015   HGB 15.8 02/07/2015   HCT 46.9 02/07/2015   MCV 85 02/07/2015   PLT 281 02/07/2015     Chemistry      Component Value Date/Time   NA 142 12/27/2014 1401   NA 138 11/22/2014 1417   NA 134* 10/20/2014 1411   K 4.3 12/27/2014 1401   K 4.4 11/22/2014 1417   K 3.9 10/20/2014 1411   CL 100 12/27/2014 1401   CL 100 10/20/2014 1411   CO2 28  12/27/2014 1401   CO2 24 11/22/2014 1417   CO2 23 10/20/2014 1411   BUN 15 12/27/2014 1401   BUN 14.1 11/22/2014 1417   BUN 14 10/20/2014 1411   CREATININE 1.3* 12/27/2014 1401   CREATININE 1.1 11/22/2014 1417   CREATININE 1.13 10/20/2014 1411      Component Value Date/Time   CALCIUM 9.1 12/27/2014 1401   CALCIUM 9.7 11/22/2014 1417   CALCIUM 9.3 10/20/2014 1411   ALKPHOS 49 12/27/2014 1401   ALKPHOS 62 11/22/2014 1417   ALKPHOS 52 10/20/2014 1411   AST 27 12/27/2014 1401   AST 27 11/22/2014 1417   AST 30 10/20/2014 1411   ALT 51* 12/27/2014 1401   ALT 50 11/22/2014 1417   ALT  55* 10/20/2014 1411   BILITOT 0.80 12/27/2014 1401   BILITOT 0.51 11/22/2014 1417   BILITOT 0.6 10/20/2014 1411         Impression and Plan: [name] is 54 year old white male.[name] He has a homozygous mutation for one of the minor genes for hemochromatosis.    His hemoglobin and hematocrit are doing well. He does not need to be phlebotomized.   Hopefully, he will be married over [place] Day weekend. He and his fiance are going to [employer] and they may get married.   I'm happy that he is doing well and that  He has found someone he can spend his life with.   We will plan to get him back in another 6 weeks or so.  [name], MD 5/4/20166:17 PM

## 2015-02-08 LAB — IRON AND TIBC CHCC
%SAT: 16 % — AB (ref 20–55)
Iron: 61 ug/dL (ref 42–163)
TIBC: 370 ug/dL (ref 202–409)
UIBC: 310 ug/dL (ref 117–376)

## 2015-02-08 LAB — FERRITIN CHCC: Ferritin: 13 ng/ml — ABNORMAL LOW (ref 22–316)

## 2015-03-01 ENCOUNTER — Other Ambulatory Visit: Payer: Self-pay | Admitting: Cardiology

## 2015-03-07 ENCOUNTER — Other Ambulatory Visit (INDEPENDENT_AMBULATORY_CARE_PROVIDER_SITE_OTHER): Payer: BLUE CROSS/BLUE SHIELD | Admitting: *Deleted

## 2015-03-07 DIAGNOSIS — E785 Hyperlipidemia, unspecified: Secondary | ICD-10-CM | POA: Diagnosis not present

## 2015-03-07 DIAGNOSIS — I5032 Chronic diastolic (congestive) heart failure: Secondary | ICD-10-CM

## 2015-03-07 LAB — LIPID PANEL
Cholesterol: 145 mg/dL (ref 0–200)
HDL: 46.5 mg/dL (ref >39.00)
LDL Cholesterol: 77 mg/dL (ref 0–99)
NONHDL: 98.5
TRIGLYCERIDES: 109 mg/dL (ref 0.0–149.0)
Total CHOL/HDL Ratio: 3
VLDL: 21.8 mg/dL (ref 0.0–40.0)

## 2015-03-07 LAB — HEPATIC FUNCTION PANEL
ALT: 53 U/L (ref 0–53)
AST: 27 U/L (ref 0–37)
Albumin: 4.1 g/dL (ref 3.5–5.2)
Alkaline Phosphatase: 56 U/L (ref 39–117)
Bilirubin, Direct: 0.1 mg/dL (ref 0.0–0.3)
TOTAL PROTEIN: 7.3 g/dL (ref 6.0–8.3)
Total Bilirubin: 0.6 mg/dL (ref 0.2–1.2)

## 2015-03-22 ENCOUNTER — Ambulatory Visit (HOSPITAL_BASED_OUTPATIENT_CLINIC_OR_DEPARTMENT_OTHER): Payer: BLUE CROSS/BLUE SHIELD | Admitting: Family

## 2015-03-22 ENCOUNTER — Other Ambulatory Visit (HOSPITAL_BASED_OUTPATIENT_CLINIC_OR_DEPARTMENT_OTHER): Payer: BLUE CROSS/BLUE SHIELD

## 2015-03-22 ENCOUNTER — Ambulatory Visit: Payer: BLUE CROSS/BLUE SHIELD

## 2015-03-22 DIAGNOSIS — E11319 Type 2 diabetes mellitus with unspecified diabetic retinopathy without macular edema: Secondary | ICD-10-CM

## 2015-03-22 LAB — CBC WITH DIFFERENTIAL (CANCER CENTER ONLY)
BASO#: 0 10*3/uL (ref 0.0–0.2)
BASO%: 0.2 % (ref 0.0–2.0)
EOS ABS: 0.5 10*3/uL (ref 0.0–0.5)
EOS%: 4.7 % (ref 0.0–7.0)
HCT: 46.7 % (ref 38.7–49.9)
HGB: 16.2 g/dL (ref 13.0–17.1)
LYMPH#: 1.8 10*3/uL (ref 0.9–3.3)
LYMPH%: 18.3 % (ref 14.0–48.0)
MCH: 29.3 pg (ref 28.0–33.4)
MCHC: 34.7 g/dL (ref 32.0–35.9)
MCV: 84 fL (ref 82–98)
MONO#: 0.9 10*3/uL (ref 0.1–0.9)
MONO%: 9.6 % (ref 0.0–13.0)
NEUT#: 6.6 10*3/uL — ABNORMAL HIGH (ref 1.5–6.5)
NEUT%: 67.2 % (ref 40.0–80.0)
PLATELETS: 248 10*3/uL (ref 145–400)
RBC: 5.53 10*6/uL (ref 4.20–5.70)
RDW: 15.8 % — AB (ref 11.1–15.7)
WBC: 9.8 10*3/uL (ref 4.0–10.0)

## 2015-03-22 LAB — COMPREHENSIVE METABOLIC PANEL
ALT: 63 U/L — AB (ref 0–53)
AST: 32 U/L (ref 0–37)
Albumin: 4.3 g/dL (ref 3.5–5.2)
Alkaline Phosphatase: 60 U/L (ref 39–117)
BUN: 17 mg/dL (ref 6–23)
CHLORIDE: 102 meq/L (ref 96–112)
CO2: 27 meq/L (ref 19–32)
Calcium: 9.7 mg/dL (ref 8.4–10.5)
Creatinine, Ser: 1.31 mg/dL (ref 0.50–1.35)
Glucose, Bld: 149 mg/dL — ABNORMAL HIGH (ref 70–99)
POTASSIUM: 4.5 meq/L (ref 3.5–5.3)
Sodium: 140 mEq/L (ref 135–145)
Total Bilirubin: 0.5 mg/dL (ref 0.2–1.2)
Total Protein: 7.3 g/dL (ref 6.0–8.3)

## 2015-03-22 LAB — TESTOSTERONE: Testosterone: 244 ng/dL — ABNORMAL LOW (ref 300–890)

## 2015-03-22 NOTE — Progress Notes (Signed)
Hematology and Oncology Follow Up Visit  LOMAN LOGAN [id number] 1961/06/10 54 y.o. 03/22/2015   Principle Diagnosis:  1. Hemochromatosis (homozygous for H63D mutation). 2. Hypogonadism secondary to hemochromatosis. 3. Erythrocytosis secondary to testosterone therapy.  Current Therapy:   1. Phlebotomy to keep hematocrit below 48%. 2. Testosterone 200 mg IM q.2 weeks - self injects at home    Interim History:  Mr. Boutelle is here today for a follow-up. He is doing well and excited about his upcoming wedding in October. He is asymptomatic at this time and staying busy fixing things around the house.  He has had no fatigue, fever, chills, n/v, cough, rash, dizziness, headache, blurry vision, chest pain, palpitations, abdominal pain, constipation, diarrhea, blood in urine or stool. He has SOB at times with exertion. This is not new for him and he attributes this to being overweight.  His appetite is good and he is staying well hydrated. His weight is down 8 lbs and he is very happy about this.  No swelling, tenderness, numbness or tingling in his extremities.  He continue to self inject testosterone every 2 weeks. His last testosterone level in March was 235. We rechecked this today.   Medications:    Medication List       This list is accurate as of: 03/22/15  3:14 PM.  Always use your most recent med list.               aspirin 81 MG EC tablet  Take 81 mg by mouth daily. Often forgets     atorvastatin 20 MG tablet  Commonly known as:  LIPITOR  Take 1 tablet (20 mg total) by mouth daily.     losartan 50 MG tablet  Commonly known as:  COZAAR  Take 1 tablet (50 mg total) by mouth daily.     tadalafil 20 MG tablet  Commonly known as:  CIALIS  Take 1 tablet (20 mg total) by mouth daily as needed for erectile dysfunction.     testosterone cypionate 200 MG/ML injection  Commonly known as:  DEPOTESTOSTERONE CYPIONATE  Inject 1.5 mLs (300 mg total) into the muscle once.         Allergies:  Allergies  Allergen Reactions  . Watermelon Concentrate Swelling    Past Medical History, Surgical history, Social history, and Family History were reviewed and updated.  Review of Systems: All other 10 point review of systems is negative.   Physical Exam:  vitals were not taken for this visit.  Wt Readings from Last 3 Encounters:  02/07/15 289 lb (131.09 kg)  01/05/15 287 lb (130.182 kg)  12/27/14 283 lb (128.368 kg)    Ocular: Sclerae unicteric, pupils equal, round and reactive to light Ear-nose-throat: Oropharynx clear, dentition fair Lymphatic: No cervical or supraclavicular adenopathy Lungs no rales or rhonchi, good excursion bilaterally Heart regular rate and rhythm, no murmur appreciated Abd soft, nontender, positive bowel sounds MSK no focal spinal tenderness, no joint edema Neuro: non-focal, well-oriented, appropriate affect Breasts: Deferred  Lab Results  Component Value Date   WBC 9.9 02/07/2015   HGB 15.8 02/07/2015   HCT 46.9 02/07/2015   MCV 85 02/07/2015   PLT 281 02/07/2015   Lab Results  Component Value Date   FERRITIN 13* 02/07/2015   IRON 61 02/07/2015   TIBC 370 02/07/2015   UIBC 310 02/07/2015   IRONPCTSAT 16* 02/07/2015   Lab Results  Component Value Date   RETICCTPCT 1.3 11/23/2008   RBC 5.55 02/07/2015  RETICCTABS 65.5 11/23/2008   No results found for: KPAFRELGTCHN, LAMBDASER, KAPLAMBRATIO No results found for: Kandis Cocking, IGMSERUM No results found for: Odetta Pink, SPEI   Chemistry      Component Value Date/Time   NA 138 02/07/2015 1346   NA 142 12/27/2014 1401   NA 138 11/22/2014 1417   K 4.3 02/07/2015 1346   K 4.3 12/27/2014 1401   K 4.4 11/22/2014 1417   CL 99 02/07/2015 1346   CL 100 12/27/2014 1401   CO2 29 02/07/2015 1346   CO2 28 12/27/2014 1401   CO2 24 11/22/2014 1417   BUN 11 02/07/2015 1346   BUN 15 12/27/2014 1401   BUN 14.1  11/22/2014 1417   CREATININE 1.14 02/07/2015 1346   CREATININE 1.3* 12/27/2014 1401   CREATININE 1.1 11/22/2014 1417      Component Value Date/Time   CALCIUM 9.6 02/07/2015 1346   CALCIUM 9.1 12/27/2014 1401   CALCIUM 9.7 11/22/2014 1417   ALKPHOS 56 03/07/2015 0841   ALKPHOS 49 12/27/2014 1401   ALKPHOS 62 11/22/2014 1417   AST 27 03/07/2015 0841   AST 27 12/27/2014 1401   AST 27 11/22/2014 1417   ALT 53 03/07/2015 0841   ALT 51* 12/27/2014 1401   ALT 50 11/22/2014 1417   BILITOT 0.6 03/07/2015 0841   BILITOT 0.80 12/27/2014 1401   BILITOT 0.51 11/22/2014 1417     Impression and Plan: Mr. Olshefski is 54 year old white male with a homozygous mutation for one of the minor genes for hemochromatosis. He is doing well and is asymptomatic at this time.  His Hct is 46.7. We will not need to phlebotomize him.  We will give him his testosterone injection and keep him at every 2 weeks.  We will plan to see him back in 2 months for labs and follow-up. He knows to call here with any questions or concerns. We can certainly see him sooner if need be.   Eliezer Bottom, NP 6/16/20163:14 PM

## 2015-03-23 LAB — IRON AND TIBC CHCC
%SAT: 24 % (ref 20–55)
Iron: 78 ug/dL (ref 42–163)
TIBC: 320 ug/dL (ref 202–409)
UIBC: 242 ug/dL (ref 117–376)

## 2015-03-23 LAB — FERRITIN CHCC: FERRITIN: 32 ng/mL (ref 22–316)

## 2015-04-19 ENCOUNTER — Ambulatory Visit (INDEPENDENT_AMBULATORY_CARE_PROVIDER_SITE_OTHER): Payer: BLUE CROSS/BLUE SHIELD | Admitting: Medical

## 2015-04-19 ENCOUNTER — Encounter: Payer: Self-pay | Admitting: Medical

## 2015-04-19 VITALS — HR 91 | Temp 98.8°F | Ht 69.0 in | Wt 278.2 lb

## 2015-04-19 DIAGNOSIS — R1010 Upper abdominal pain, unspecified: Secondary | ICD-10-CM

## 2015-04-19 DIAGNOSIS — R739 Hyperglycemia, unspecified: Secondary | ICD-10-CM | POA: Diagnosis not present

## 2015-04-19 DIAGNOSIS — I10 Essential (primary) hypertension: Secondary | ICD-10-CM

## 2015-04-19 LAB — CBC WITH DIFFERENTIAL/PLATELET
BASOS ABS: 0 10*3/uL (ref 0.0–0.1)
BASOS PCT: 0.4 % (ref 0.0–3.0)
Eosinophils Absolute: 0.4 10*3/uL (ref 0.0–0.7)
Eosinophils Relative: 4.3 % (ref 0.0–5.0)
HCT: 52.9 % — ABNORMAL HIGH (ref 39.0–52.0)
Hemoglobin: 17.6 g/dL — ABNORMAL HIGH (ref 13.0–17.0)
LYMPHS ABS: 1.4 10*3/uL (ref 0.7–4.0)
Lymphocytes Relative: 16.5 % (ref 12.0–46.0)
MCHC: 33.4 g/dL (ref 30.0–36.0)
MCV: 86.6 fl (ref 78.0–100.0)
MONO ABS: 0.6 10*3/uL (ref 0.1–1.0)
Monocytes Relative: 7.6 % (ref 3.0–12.0)
NEUTROS ABS: 5.9 10*3/uL (ref 1.4–7.7)
NEUTROS PCT: 71.2 % (ref 43.0–77.0)
Platelets: 210 10*3/uL (ref 150.0–400.0)
RBC: 6.1 Mil/uL — ABNORMAL HIGH (ref 4.22–5.81)
RDW: 17.4 % — ABNORMAL HIGH (ref 11.5–15.5)
WBC: 8.3 10*3/uL (ref 4.0–10.5)

## 2015-04-19 LAB — COMPREHENSIVE METABOLIC PANEL
ALBUMIN: 4.1 g/dL (ref 3.5–5.2)
ALT: 56 U/L — AB (ref 0–53)
AST: 32 U/L (ref 0–37)
Alkaline Phosphatase: 60 U/L (ref 39–117)
BILIRUBIN TOTAL: 0.8 mg/dL (ref 0.2–1.2)
BUN: 16 mg/dL (ref 6–23)
CO2: 28 meq/L (ref 19–32)
Calcium: 9.5 mg/dL (ref 8.4–10.5)
Chloride: 100 mEq/L (ref 96–112)
Creatinine, Ser: 1.16 mg/dL (ref 0.40–1.50)
GFR: 69.57 mL/min (ref >60.00)
Glucose, Bld: 157 mg/dL — ABNORMAL HIGH (ref 70–99)
Potassium: 4.3 mEq/L (ref 3.5–5.1)
Sodium: 136 mEq/L (ref 135–145)
Total Protein: 7.3 g/dL (ref 6.0–8.3)

## 2015-04-19 LAB — POCT URINALYSIS DIPSTICK
Bilirubin, UA: NEGATIVE
Blood, UA: NEGATIVE
Glucose, UA: 250
KETONES UA: NEGATIVE
LEUKOCYTES UA: NEGATIVE
Nitrite, UA: NEGATIVE
PH UA: 5
PROTEIN UA: 2000
Spec Grav, UA: 1.025
UROBILINOGEN UA: 0.2

## 2015-04-19 LAB — LIPASE: Lipase: 52 U/L (ref 11.0–59.0)

## 2015-04-19 LAB — H. PYLORI ANTIBODY, IGG: H PYLORI IGG: NEGATIVE

## 2015-04-19 LAB — HEMOGLOBIN A1C: Hgb A1c MFr Bld: 7.9 % — ABNORMAL HIGH (ref 4.6–6.5)

## 2015-04-19 LAB — AMYLASE: AMYLASE: 39 U/L (ref 27–131)

## 2015-04-19 MED ORDER — RANITIDINE HCL 150 MG PO CAPS
150.0000 mg | ORAL_CAPSULE | Freq: Two times a day (BID) | ORAL | Status: DC
Start: 2015-04-19 — End: 2015-05-17

## 2015-04-19 MED ORDER — SILDENAFIL CITRATE 100 MG PO TABS: 100.0000 mg | ORAL_TABLET | Freq: Every day | ORAL | Status: DC | PRN

## 2015-04-19 MED ORDER — ONDANSETRON 8 MG PO TBDP: 8.0000 mg | ORAL_TABLET | Freq: Three times a day (TID) | ORAL | Status: DC | PRN

## 2015-04-19 NOTE — Assessment & Plan Note (Addendum)
Pt is on cozaar. Will recheck this Tuesday and may need adjustment of treatment regimen. Not ideal presently.

## 2015-04-19 NOTE — Patient Instructions (Addendum)
  You may have had acute food poising. But will treat for gastritis pending work up. Bland diet, hydrate with propel.  Ranitidine rx.  Cbc,cmp, amylase, lipase, and hpylori. UA dip.  If abdomen pain worsens/changes and severe then ED evaluation.  Follow up Tuesday am or as needed.  Want to check bp on follow up and maybe adjust meds if necessary.   With prior heart history any cardiac type symptoms then ED evaluation.  Hemochromatosis See Dr. Katheran Awe every month.  Hypertension Pt is on cozaar. Will recheck this Tuesday and may need adjustment of treatment regimen. Not ideal presently.

## 2015-04-19 NOTE — Progress Notes (Signed)
Subjective:    Patient ID: Kevin Wiggins, male    DOB: October 22, 1960, 54 y.o.   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  Pt states on Friday night he went out to eat. He ate at [employer]. He states usually fresh and no problems. Pt states ate at 6 pm ate then later at  midnight had upset stomach. He vomited a lot on night. Saturday some popsicles and water. Pt denies any loose stools. Sunday started to eat but can't eat much now. Mild stomach upset every time he eats but no vomiting. No diarrhea. No alcohol the other day and does not drink.  Other night urine looked concentrated but now not the case. Pt states alkaselter does help. States mild burning sensation epigastric.  With vomiting constant the other night at onset  he states rt upper pec little sore and mild upper back pain but only with movement and twisting of thorax. No mid or left sided chest pain. No sob.  Pt feels best today than any other day since illness.      Review of Systems  Constitutional: Negative for fever, chills, diaphoresis, activity change and fatigue.  Respiratory: Negative for cough, chest tightness and shortness of breath.   Cardiovascular: Negative for chest pain, palpitations and leg swelling.  Gastrointestinal: Positive for abdominal pain. Negative for nausea and vomiting.       See hpi.  Musculoskeletal: Negative for neck pain and neck stiffness.       Rt scapula area pain on direct palpation.  Neurological: Negative for dizziness, tremors, seizures, syncope, facial asymmetry, speech difficulty, weakness, light-headedness, numbness and headaches.  Psychiatric/Behavioral: Negative for behavioral problems, confusion and agitation. The patient is not nervous/anxious.     Past Medical History  Diagnosis Date  .  Refusal of blood transfusions as patient is Jehovah's Witness   . Diastolic CHF     a. Acute diastolic CHF 40/9811 in the setting of perimembranous VSD. Eval underway given mod pulm HTN and RV dilation.  . Borderline diabetic     a. A1C 6.6 in 07/2013 - needs to f/u PCP for likely full DM.  [name] Hemochromatosis     H63D homozyous mutation  . VSD (ventricular septal defect), perimembranous     a. Dx 07/2013 with significant L-R shunt.  . Pulmonary HTN     a. 07/2013: Moderate pulmonary HTN with RV dilitation.  [name] CAD (coronary artery disease)     a. 07/2013: mild nonobstructive CAD by cath 07/25/13 as part of eval for VSD/SOB.  . CKD (chronic kidney disease)   . RBBB   . LAFB (left anterior fascicular block)   . Hypogonadism male   . H/O: hypothyroidism   . ED (erectile dysfunction)   . Leukocytosis     secondary to testosterone replacement  . Erythrocytosis     secondary to testosterone replacement  . Diabetes mellitus without complication   . GERD (gastroesophageal reflux disease)   . Heart murmur   . Sleep apnea   . Hyperlipidemia   . Hypertension     History   Social History  . Marital Status: Divorced    Spouse Name: N/A  . Number of Children: N/A  . Years of Education: N/A   Occupational History  .      Maintenance  Social History Main Topics  . Smoking status: Never Smoker   . Smokeless tobacco: Never Used     Comment: never used tobacco  . Alcohol Use: No     Comment: socially  . Drug Use: No  . Sexual Activity: Not on file   Other Topics Concern  . Not on file   Social History Narrative    Past Surgical History  Procedure Laterality Date  . Tee without cardioversion N/A 07/25/2013    Procedure: TRANSESOPHAGEAL ECHOCARDIOGRAM (TEE);  Surgeon: Dorothy Spark, MD;  Location: [facility] ENDOSCOPY;  Service: Cardiovascular;  Laterality: N/A;  . Phlebotomy    . Left and right heart catheterization with coronary angiogram N/A 07/25/2013    Procedure: LEFT AND  RIGHT HEART CATHETERIZATION WITH CORONARY ANGIOGRAM;  Surgeon: Jolaine Artist, MD;  Location: [facility] CATH LAB;  Service: Cardiovascular;  Laterality: N/A;  . Cardiac surgery      Hole patched    Family History  Problem Relation Age of Onset  . Cancer Mother   . Hypertension Father   . Congestive Heart Failure Father   . Hemochromatosis      family history    Allergies  Allergen Reactions  . Watermelon Concentrate Swelling    Current Outpatient Prescriptions on File Prior to Visit  Medication Sig Dispense Refill  . aspirin 81 MG EC tablet Take 81 mg by mouth daily. Often forgets    . atorvastatin (LIPITOR) 20 MG tablet Take 1 tablet (20 mg total) by mouth daily. 30 tablet 3  . losartan (COZAAR) 50 MG tablet Take 1 tablet (50 mg total) by mouth daily. 90 tablet 2  . testosterone cypionate (DEPOTESTOTERONE CYPIONATE) 200 MG/ML injection Inject 1.5 mLs (300 mg total) into the muscle once. 20 mL 3   No current facility-administered medications on file prior to visit.    BP   Pulse 91  Temp(Src) 98.8 F (37.1 C) (Oral)  Ht 5\' 9"  (1.753 m)  Wt 278 lb 3.2 oz (126.191 kg)  BMI 41.06 kg/m2  SpO2 99%       Objective:   Physical Exam  General Appearance- Not in acute distress.  HEENT Eyes- Scleraeral/Conjuntiva-bilat- Not Yellow. Mouth & Throat- Normal.  Chest and Lung Exam Auscultation: Breath sounds:-Normal. Adventitious sounds:- No Adventitious sounds.  Cardiovascular Auscultation:Rythm - Regular. Heart Sounds -Normal heart sounds.  Abdomen Inspection:-Inspection Normal.  Palpation/Perucssion: Palpation and Percussion of the abdomen reveal- mild epigastric/upper quadrants Tender, No Rebound tenderness, No rigidity(Guarding) and No Palpable abdominal masses.  Liver:-Normal.  Spleen:- Normal.   Rt upper scapula area tender to palpation Rt upper outer pectoralis. Mild sore on movng rt upper arm and on palpation.  No cva tenderness.   General Mental Status-  Alert. General Appearance- Not in acute distress.   Skin General: Color- Normal Color. Moisture- Normal Moisture.  Neck Carotid Arteries- Normal color. Moisture- Normal Moisture. No carotid bruits. No JVD.  Chest and Lung Exam Auscultation: Breath Sounds:-Normal.  Cardiovascular Auscultation:Rythm- Regular. Murmurs & Other Heart Sounds:Auscultation of the heart reveals- No Murmurs.  Abdomen Inspection:-Inspeection Normal. Palpation/Percussion:Note:No mass. Palpation and Percussion of the abdomen reveal- Non Tender, Non Distended + BS, no rebound or guarding.    Neurologic Cranial Nerve exam:- CN III-XII intact(No nystagmus), symmetric smile. Finger to Nose:- Normal/Intact Strength:- 5/5 equal and symmetric strength both upper and lower extremities.     Assessment & Plan:  You may have had acute food poising. But will treat for gastritis pending work up. Bland diet, hydrate with propel.  Ranitidine rx.  Cbc,cmp, amylase, lipase, and hpylori. UA dip.  If abdomen pain worsens/changes and severe then ED evaluation.  Follow up Tuesday am or as needed.  Want to check bp on follow up and maybe adjust meds if necessary.   With prior heart history any cardiac type symptoms then ED evaluation

## 2015-04-19 NOTE — Progress Notes (Signed)
Pre visit review using our clinic review tool, if applicable. No additional management support is needed unless otherwise documented below in the visit note. 

## 2015-04-19 NOTE — Assessment & Plan Note (Signed)
See Dr. Katheran Awe every month.

## 2015-04-24 ENCOUNTER — Ambulatory Visit (INDEPENDENT_AMBULATORY_CARE_PROVIDER_SITE_OTHER): Payer: BLUE CROSS/BLUE SHIELD | Admitting: Medical

## 2015-04-24 ENCOUNTER — Encounter: Payer: Self-pay | Admitting: Medical

## 2015-04-24 VITALS — BP 143/87 | HR 94 | Temp 98.2°F | Ht 69.0 in | Wt 277.8 lb

## 2015-04-24 DIAGNOSIS — J3089 Other allergic rhinitis: Secondary | ICD-10-CM

## 2015-04-24 DIAGNOSIS — K219 Gastro-esophageal reflux disease without esophagitis: Secondary | ICD-10-CM | POA: Diagnosis not present

## 2015-04-24 MED ORDER — FLUTICASONE PROPIONATE 50 MCG/ACT NA SUSP: 2.0000 | Freq: Every day | NASAL | Status: DC

## 2015-04-24 MED ORDER — LORATADINE 10 MG PO TABS: 10.0000 mg | ORAL_TABLET | Freq: Every day | ORAL | Status: DC

## 2015-04-24 NOTE — Progress Notes (Signed)
   Subjective:    Patient ID: Kevin Wiggins, male    DOB: Jan 01, 1961, 54 y.o.   MRN: [id number]  HPI  Pt states he feels a lot better with his abdomen pain. Burning epigastric pain and nausea. Quickly with ranitidine just recently. He states had has good appetite since last visit.  Pt states mild sinus pressure recently. Pt states mild nasal congestion. Some pnd. When younger had more sinus problems. Some allergies throughout the year Symptoms came on this am. Mowed grass yesterday.     Review of Systems  Constitutional: Negative for fever, chills and fatigue.  HENT: Positive for congestion, postnasal drip and sinus pressure. Negative for ear pain.        Faint sinus pressure.  Respiratory: Negative for cough, chest tightness, shortness of breath and wheezing.   Cardiovascular: Negative for chest pain and palpitations.  Gastrointestinal: Negative for abdominal pain, diarrhea, constipation, abdominal distention and rectal pain.  Musculoskeletal: Negative for back pain.  Skin: Negative for rash.  Neurological: Negative for dizziness, weakness, numbness and headaches.  Hematological: Negative for adenopathy. Does not bruise/bleed easily.  Psychiatric/Behavioral: Negative for behavioral problems and confusion.       Objective:   Physical Exam  General  Mental Status - Alert. General Appearance - Well groomed. Not in acute distress.  Skin Rashes- No Rashes.  HEENT Head- Normal. Ear Auditory Canal - Left- Normal. Right - Normal.Tympanic Membrane- Left- Normal. Right- Normal. Eye Sclera/Conjunctiva- Left- Normal. Right- Normal. Nose & Sinuses Nasal Mucosa- Left-  Boggy + Congested. Right-  Boggy + Congested. Mouth & Throat Lips: Upper Lip- Normal: no dryness, cracking, pallor, cyanosis, or vesicular eruption. Lower Lip-Normal: no dryness, cracking, pallor, cyanosis or vesicular eruption. Buccal Mucosa- Bilateral- No Aphthous ulcers. Oropharynx- No Discharge or Erythema.  +pnd. Tonsils: Characteristics- Bilateral- No Erythema or Congestion. Size/Enlargement- Bilateral- No enlargement. Discharge- bilateral-None.  Neck Neck- Supple. No Masses.   Chest and Lung Exam Auscultation: Breath Sounds:- even and unlabored  Cardiovascular Auscultation:Rythm- Regular, rate and rhythm. Murmurs & Other Heart Sounds:Ausculatation of the heart reveal- No Murmurs.  Lymphatic Head & Neck General Head & Neck Lymphatics: Bilateral: Description- No Localized lymphadenopathy.   Abdomen Inspection:-Inspection Normal.  Palpation/Perucssion: Palpation and Percussion of the abdomen reveal- Non Tender, No Rebound tenderness, No rigidity(Guarding) and No Palpable abdominal masses.  Liver:-Normal.  Spleen:- Normal.        Assessment & Plan:

## 2015-04-24 NOTE — Progress Notes (Signed)
Pre visit review using our clinic review tool, if applicable. No additional management support is needed unless otherwise documented below in the visit note. 

## 2015-04-24 NOTE — Patient Instructions (Addendum)
GERD (gastroesophageal reflux disease) Much improved with ranitidine. If finished with current rx  Ranitidine and symptoms return then call us and will refill rx.    Some allergy symptoms since cutting grass. Rx flonase and claritin. If before Friday any worse sinus pressure let me know and will send in antibiotic.  Follow up in 7 days or as needed any persisting or worsening signs or symptoms.  Advised patient with next 3 months get cpe/fasting.

## 2015-04-24 NOTE — Assessment & Plan Note (Signed)
Much improved with ranitidine. If finished with current rx  Ranitidine and symptoms return then call us and will refill rx.

## 2015-05-03 ENCOUNTER — Other Ambulatory Visit (HOSPITAL_BASED_OUTPATIENT_CLINIC_OR_DEPARTMENT_OTHER): Payer: BLUE CROSS/BLUE SHIELD

## 2015-05-03 ENCOUNTER — Ambulatory Visit (HOSPITAL_BASED_OUTPATIENT_CLINIC_OR_DEPARTMENT_OTHER): Payer: BLUE CROSS/BLUE SHIELD | Admitting: Hematology & Oncology

## 2015-05-03 ENCOUNTER — Ambulatory Visit (HOSPITAL_BASED_OUTPATIENT_CLINIC_OR_DEPARTMENT_OTHER): Payer: BLUE CROSS/BLUE SHIELD

## 2015-05-03 ENCOUNTER — Encounter: Payer: Self-pay | Admitting: Hematology & Oncology

## 2015-05-03 LAB — CBC WITH DIFFERENTIAL (CANCER CENTER ONLY)
BASO#: 0 10*3/uL (ref 0.0–0.2)
BASO%: 0.2 % (ref 0.0–2.0)
EOS%: 4.4 % (ref 0.0–7.0)
Eosinophils Absolute: 0.4 10*3/uL (ref 0.0–0.5)
HEMATOCRIT: 49.2 % (ref 38.7–49.9)
HGB: 17.3 g/dL — ABNORMAL HIGH (ref 13.0–17.1)
LYMPH#: 1.8 10*3/uL (ref 0.9–3.3)
LYMPH%: 19.8 % (ref 14.0–48.0)
MCH: 29.7 pg (ref 28.0–33.4)
MCHC: 35.2 g/dL (ref 32.0–35.9)
MCV: 84 fL (ref 82–98)
MONO#: 1 10*3/uL — ABNORMAL HIGH (ref 0.1–0.9)
MONO%: 10.8 % (ref 0.0–13.0)
NEUT#: 5.9 10*3/uL (ref 1.5–6.5)
NEUT%: 64.8 % (ref 40.0–80.0)
Platelets: 300 10*3/uL (ref 145–400)
RBC: 5.83 10*6/uL — ABNORMAL HIGH (ref 4.20–5.70)
RDW: 15.9 % — ABNORMAL HIGH (ref 11.1–15.7)
WBC: 9.1 10*3/uL (ref 4.0–10.0)

## 2015-05-03 NOTE — Progress Notes (Signed)
Hematology and Oncology Follow Up Visit  Kevin Wiggins [id number] December 12, 1960 54 y.o. 05/03/2015   Principle Diagnosis:  1.  Hemochromatosis (homozygous for H63D mutation). 2. Hypogonadism secondary to hemochromatosis. 3. Erythrocytosis secondary to testosterone therapy. 4. Ruptured right sinus of Valsalva aneurysm.  Current Therapy:   1. The patient status post cardiac surgery to repair the sinus of     Valsalva aneurysm. 2. Phlebotomy to keep hematocrit below 48%. 3. Testosterone 200 mg IM q.2 weeks.     Interim History:  [name] is back for followup. He's doing fantastic. He's had a good summer. The wedding date that he is set for he and his fiance will be October 3.  He's had some chest discomfort. This is not anything new.  He says that he recently had a gastroenteritis. He was in the bed for about 4 days. He is recovered from this. He lost a little weight with this.  Hemochromatosis really has not been a problem for him. We last saw him in June, his ferritin was 132. His iron saturation was 24%.  He's doing well with the testosterone injection. He does this himself. His last testosterone level in June was 244.  Overall, his performance status is ECOG 1.  Medications:  Current outpatient prescriptions:  .  aspirin 81 MG EC tablet, Take 81 mg by mouth daily. Often forgets, Disp: , Rfl:  .  atorvastatin (LIPITOR) 20 MG tablet, Take 1 tablet (20 mg total) by mouth daily., Disp: 30 tablet, Rfl: 3 .  fluticasone (FLONASE) 50 MCG/ACT nasal spray, Place 2 sprays into both nostrils daily., Disp: 16 g, Rfl: 1 .  loratadine (CLARITIN) 10 MG tablet, Take 1 tablet (10 mg total) by mouth daily., Disp: 30 tablet, Rfl: 1 .  losartan (COZAAR) 50 MG tablet, Take 1 tablet (50 mg total) by mouth daily., Disp: 90 tablet, Rfl: 2 .  ranitidine (ZANTAC) 150 MG capsule, Take 1 capsule (150 mg total) by mouth 2 (two) times daily., Disp: 60 capsule, Rfl: 0 .  sildenafil (VIAGRA) 100 MG tablet, Take  1 tablet (100 mg total) by mouth daily as needed for erectile dysfunction., Disp: 10 tablet, Rfl: 0 .  testosterone cypionate (DEPOTESTOTERONE CYPIONATE) 200 MG/ML injection, Inject 1.5 mLs (300 mg total) into the muscle once., Disp: 20 mL, Rfl: 3 .  ondansetron (ZOFRAN ODT) 8 MG disintegrating tablet, Take 1 tablet (8 mg total) by mouth every 8 (eight) hours as needed for nausea or vomiting. (Patient not taking: Reported on 05/03/2015), Disp: 12 tablet, Rfl: 0  Allergies:  Allergies  Allergen Reactions  . Watermelon Concentrate Swelling    Past Medical History, Surgical history, Social history, and Family History were reviewed and updated.  Review of Systems: As above  Physical Exam:  height is 5\' 9"  (1.753 m) and weight is 277 lb (125.646 kg). His oral temperature is 98.1 F (36.7 C). His blood pressure is 141/76 and his pulse is 96. His respiration is 20.   Well-developed and well-nourished white gentleman. Head and neck exam shows no ocular or oral lesions. He has no conjunctival inflammation. He has no palpable cervical or supraclavicular lymph nodes. Lungs are clear. Cardiac exam regular rate and rhythm with no murmurs rubs or bruits. Abdomen is soft. He is moderately obese. There is no fluid wave. There is no palpable liver or spleen tip. Back exam shows no tenderness over the spine ribs or hips. Extremities shows no clubbing cyanosis or edema. Neurological exam shows no focal neurological  deficits. Skin exam no rashes, ecchymoses or petechia.   Lab Results  Component Value Date   WBC 9.1 05/03/2015   HGB 17.3* 05/03/2015   HCT 49.2 05/03/2015   MCV 84 05/03/2015   PLT 300 05/03/2015     Chemistry      Component Value Date/Time   NA 136 04/19/2015 1154   NA 142 12/27/2014 1401   NA 138 11/22/2014 1417   K 4.3 04/19/2015 1154   K 4.3 12/27/2014 1401   K 4.4 11/22/2014 1417   CL 100 04/19/2015 1154   CL 100 12/27/2014 1401   CO2 28 04/19/2015 1154   CO2 28 12/27/2014 1401    CO2 24 11/22/2014 1417   BUN 16 04/19/2015 1154   BUN 15 12/27/2014 1401   BUN 14.1 11/22/2014 1417   CREATININE 1.16 04/19/2015 1154   CREATININE 1.3* 12/27/2014 1401   CREATININE 1.1 11/22/2014 1417      Component Value Date/Time   CALCIUM 9.5 04/19/2015 1154   CALCIUM 9.1 12/27/2014 1401   CALCIUM 9.7 11/22/2014 1417   ALKPHOS 60 04/19/2015 1154   ALKPHOS 49 12/27/2014 1401   ALKPHOS 62 11/22/2014 1417   AST 32 04/19/2015 1154   AST 27 12/27/2014 1401   AST 27 11/22/2014 1417   ALT 56* 04/19/2015 1154   ALT 51* 12/27/2014 1401   ALT 50 11/22/2014 1417   BILITOT 0.8 04/19/2015 1154   BILITOT 0.80 12/27/2014 1401   BILITOT 0.51 11/22/2014 1417         Impression and Plan: Kevin Wiggins is 54 year old white male.[name] He has a homozygous mutation for one of the minor genes for hemochromatosis.    His hemoglobin and hematocrit are up a little bit. He clearly needs to be phlebotomized. We will do this today.  He will be in married October 3. I will make sure that everything is okay for him so that he can enjoy his wedding day.  We will plan to get him back in another 6 weeks or so.  [name], MD 7/28/20165:59 PM

## 2015-05-03 NOTE — Patient Instructions (Signed)

## 2015-05-04 LAB — COMPREHENSIVE METABOLIC PANEL
ALBUMIN: 4.2 g/dL (ref 3.6–5.1)
ALK PHOS: 62 U/L (ref 40–115)
ALT: 58 U/L — AB (ref 9–46)
AST: 30 U/L (ref 10–35)
BUN: 16 mg/dL (ref 7–25)
CHLORIDE: 101 meq/L (ref 98–110)
CO2: 20 mEq/L (ref 20–31)
CREATININE: 1.12 mg/dL (ref 0.70–1.33)
Calcium: 9.9 mg/dL (ref 8.6–10.3)
Glucose, Bld: 127 mg/dL — ABNORMAL HIGH (ref 65–99)
POTASSIUM: 4.5 meq/L (ref 3.5–5.3)
Sodium: 137 mEq/L (ref 135–146)
Total Bilirubin: 0.8 mg/dL (ref 0.2–1.2)
Total Protein: 7.2 g/dL (ref 6.1–8.1)

## 2015-05-04 LAB — IRON AND TIBC CHCC
%SAT: 45 % (ref 20–55)
Iron: 131 ug/dL (ref 42–163)
TIBC: 290 ug/dL (ref 202–409)
UIBC: 159 ug/dL (ref 117–376)

## 2015-05-04 LAB — FERRITIN CHCC: Ferritin: 60 ng/ml (ref 22–316)

## 2015-05-04 LAB — TESTOSTERONE: Testosterone: 377 ng/dL (ref 300–890)

## 2015-05-17 ENCOUNTER — Other Ambulatory Visit: Payer: Self-pay

## 2015-05-17 MED ORDER — RANITIDINE HCL 150 MG PO CAPS: 150.0000 mg | ORAL_CAPSULE | Freq: Two times a day (BID) | ORAL | Status: DC

## 2015-05-24 ENCOUNTER — Telehealth: Payer: Self-pay | Admitting: Medical

## 2015-05-24 NOTE — Telephone Encounter (Signed)
pre visit letter mailed 05/15/15

## 2015-06-04 ENCOUNTER — Encounter: Payer: Self-pay | Admitting: *Deleted

## 2015-06-04 ENCOUNTER — Telehealth: Payer: Self-pay | Admitting: *Deleted

## 2015-06-04 NOTE — Telephone Encounter (Signed)
Unable to reach patient at time of Pre-Visit Call.  Left message for patient to return call when available.    

## 2015-06-04 NOTE — Addendum Note (Signed)
Addended by: Leticia Penna A on: 06/04/2015 02:05 PM   Modules accepted: Medications

## 2015-06-04 NOTE — Telephone Encounter (Signed)
Pre-Visit Call completed with patient and chart updated.   Pre-Visit Info documented in Specialty Comments under [employer].    

## 2015-06-05 ENCOUNTER — Encounter: Payer: BLUE CROSS/BLUE SHIELD | Admitting: Medical

## 2015-06-06 ENCOUNTER — Encounter: Payer: Self-pay | Admitting: Gastroenterology

## 2015-06-06 ENCOUNTER — Encounter: Payer: Self-pay | Admitting: Medical

## 2015-06-06 ENCOUNTER — Ambulatory Visit (INDEPENDENT_AMBULATORY_CARE_PROVIDER_SITE_OTHER): Payer: BLUE CROSS/BLUE SHIELD | Admitting: Medical

## 2015-06-06 VITALS — BP 122/80 | HR 89 | Temp 98.4°F | Resp 16 | Ht 69.0 in | Wt 283.0 lb

## 2015-06-06 DIAGNOSIS — Z1211 Encounter for screening for malignant neoplasm of colon: Secondary | ICD-10-CM | POA: Diagnosis not present

## 2015-06-06 DIAGNOSIS — Z0000 Encounter for general adult medical examination without abnormal findings: Secondary | ICD-10-CM

## 2015-06-06 DIAGNOSIS — Z113 Encounter for screening for infections with a predominantly sexual mode of transmission: Secondary | ICD-10-CM | POA: Diagnosis not present

## 2015-06-06 DIAGNOSIS — E119 Type 2 diabetes mellitus without complications: Secondary | ICD-10-CM

## 2015-06-06 LAB — CBC WITH DIFFERENTIAL/PLATELET
BASOS ABS: 0.1 10*3/uL (ref 0.0–0.1)
Basophils Relative: 0.6 % (ref 0.0–3.0)
EOS ABS: 0.4 10*3/uL (ref 0.0–0.7)
Eosinophils Relative: 4.3 % (ref 0.0–5.0)
HCT: 49.4 % (ref 39.0–52.0)
Hemoglobin: 16.7 g/dL (ref 13.0–17.0)
LYMPHS ABS: 1.6 10*3/uL (ref 0.7–4.0)
Lymphocytes Relative: 16.3 % (ref 12.0–46.0)
MCHC: 33.8 g/dL (ref 30.0–36.0)
MCV: 89.3 fl (ref 78.0–100.0)
Monocytes Absolute: 0.8 10*3/uL (ref 0.1–1.0)
Monocytes Relative: 8 % (ref 3.0–12.0)
NEUTROS PCT: 70.8 % (ref 43.0–77.0)
Neutro Abs: 6.9 10*3/uL (ref 1.4–7.7)
PLATELETS: 225 10*3/uL (ref 150.0–400.0)
RBC: 5.53 Mil/uL (ref 4.22–5.81)
RDW: 17 % — ABNORMAL HIGH (ref 11.5–15.5)
WBC: 9.8 10*3/uL (ref 4.0–10.5)

## 2015-06-06 LAB — LIPID PANEL
CHOL/HDL RATIO: 3
Cholesterol: 147 mg/dL (ref 0–200)
HDL: 42.5 mg/dL (ref >39.00)
LDL Cholesterol: 78 mg/dL (ref 0–99)
NonHDL: 104.61
TRIGLYCERIDES: 132 mg/dL (ref 0.0–149.0)
VLDL: 26.4 mg/dL (ref 0.0–40.0)

## 2015-06-06 LAB — COMPREHENSIVE METABOLIC PANEL
ALK PHOS: 65 U/L (ref 39–117)
ALT: 64 U/L — AB (ref 0–53)
AST: 38 U/L — ABNORMAL HIGH (ref 0–37)
Albumin: 4.2 g/dL (ref 3.5–5.2)
BUN: 12 mg/dL (ref 6–23)
CO2: 30 meq/L (ref 19–32)
CREATININE: 1.2 mg/dL (ref 0.40–1.50)
Calcium: 9.4 mg/dL (ref 8.4–10.5)
Chloride: 100 mEq/L (ref 96–112)
GFR: 66.87 mL/min (ref >60.00)
GLUCOSE: 178 mg/dL — AB (ref 70–99)
Potassium: 4.2 mEq/L (ref 3.5–5.1)
Sodium: 138 mEq/L (ref 135–145)
TOTAL PROTEIN: 7.1 g/dL (ref 6.0–8.3)
Total Bilirubin: 0.8 mg/dL (ref 0.2–1.2)

## 2015-06-06 LAB — POCT URINALYSIS DIPSTICK
Bilirubin, UA: NEGATIVE
Blood, UA: NEGATIVE
Glucose, UA: NEGATIVE
Leukocytes, UA: NEGATIVE
Nitrite, UA: NEGATIVE
PH UA: 6
Urobilinogen, UA: 0.2

## 2015-06-06 LAB — HIV ANTIBODY (ROUTINE TESTING W REFLEX): HIV: NONREACTIVE

## 2015-06-06 LAB — PSA: PSA: 0.5 ng/mL (ref 0.10–4.00)

## 2015-06-06 LAB — TSH: TSH: 0.81 u[IU]/mL (ref 0.35–4.50)

## 2015-06-06 MED ORDER — PNEUMOCOCCAL VAC POLYVALENT 25 MCG/0.5ML IJ INJ
0.5000 mL | INJECTION | Freq: Once | INTRAMUSCULAR | Status: AC
  Administered 2015-06-06: 0.5 mL via INTRAMUSCULAR

## 2015-06-06 NOTE — Patient Instructions (Addendum)
Wellness examination Cbc, cmp, tsh, lipid panel, psa, order for screening colonosocopy. HIV screening. Pneuvmovaccine today.  Psv 23 today.  Note in the fall plan to get a1-c, urine microalbumin and testosterone level.  Preventive Care for Adults A healthy lifestyle and preventive care can promote health and wellness. Preventive health guidelines for men include the following key practices:  A routine yearly physical is a good way to check with your health care provider about your health and preventative screening. It is a chance to share any concerns and updates on your health and to receive a thorough exam.  Visit your dentist for a routine exam and preventative care every 6 months. Brush your teeth twice a day and floss once a day. Good oral hygiene prevents tooth decay and gum disease.  The frequency of eye exams is based on your age, health, family medical history, use of contact lenses, and other factors. Follow your health care provider's recommendations for frequency of eye exams.  Eat a healthy diet. Foods such as vegetables, fruits, whole grains, low-fat dairy products, and lean protein foods contain the nutrients you need without too many calories. Decrease your intake of foods high in solid fats, added sugars, and salt. Eat the right amount of calories for you.Get information about a proper diet from your health care provider, if necessary.  Regular physical exercise is one of the most important things you can do for your health. Most adults should get at least 150 minutes of moderate-intensity exercise (any activity that increases your heart rate and causes you to sweat) each week. In addition, most adults need muscle-strengthening exercises on 2 or more days a week.  Maintain a healthy weight. The body mass index (BMI) is a screening tool to identify possible weight problems. It provides an estimate of body fat based on height and weight. Your health care provider can find your BMI  and can help you achieve or maintain a healthy weight.For adults 20 years and older:  A BMI below 18.5 is considered underweight.  A BMI of 18.5 to 24.9 is normal.  A BMI of 25 to 29.9 is considered overweight.  A BMI of 30 and above is considered obese.  Maintain normal blood lipids and cholesterol levels by exercising and minimizing your intake of saturated fat. Eat a balanced diet with plenty of fruit and vegetables. Blood tests for lipids and cholesterol should begin at age 46 and be repeated every 5 years. If your lipid or cholesterol levels are high, you are over 50, or you are at high risk for heart disease, you may need your cholesterol levels checked more frequently.Ongoing high lipid and cholesterol levels should be treated with medicines if diet and exercise are not working.  If you smoke, find out from your health care provider how to quit. If you do not use tobacco, do not start.  Lung cancer screening is recommended for adults aged 67-80 years who are at high risk for developing lung cancer because of a history of smoking. A yearly low-dose CT scan of the lungs is recommended for people who have at least a 30-pack-year history of smoking and are a current smoker or have quit within the past 15 years. A pack year of smoking is smoking an average of 1 pack of cigarettes a day for 1 year (for example: 1 pack a day for 30 years or 2 packs a day for 15 years). Yearly screening should continue until the smoker has stopped smoking for at least  15 years. Yearly screening should be stopped for people who develop a health problem that would prevent them from having lung cancer treatment.  If you choose to drink alcohol, do not have more than 2 drinks per day. One drink is considered to be 12 ounces (355 mL) of beer, 5 ounces (148 mL) of wine, or 1.5 ounces (44 mL) of liquor.  Avoid use of street drugs. Do not share needles with anyone. Ask for help if you need support or instructions about  stopping the use of drugs.  High blood pressure causes heart disease and increases the risk of stroke. Your blood pressure should be checked at least every 1-2 years. Ongoing high blood pressure should be treated with medicines, if weight loss and exercise are not effective.  If you are 48-83 years old, ask your health care provider if you should take aspirin to prevent heart disease.  Diabetes screening involves taking a blood sample to check your fasting blood sugar level. This should be done once every 3 years, after age 78, if you are within normal weight and without risk factors for diabetes. Testing should be considered at a younger age or be carried out more frequently if you are overweight and have at least 1 risk factor for diabetes.  Colorectal cancer can be detected and often prevented. Most routine colorectal cancer screening begins at the age of 4 and continues through age 80. However, your health care provider may recommend screening at an earlier age if you have risk factors for colon cancer. On a yearly basis, your health care provider may provide home test kits to check for hidden blood in the stool. Use of a small camera at the end of a tube to directly examine the colon (sigmoidoscopy or colonoscopy) can detect the earliest forms of colorectal cancer. Talk to your health care provider about this at age 59, when routine screening begins. Direct exam of the colon should be repeated every 5-10 years through age 47, unless early forms of precancerous polyps or small growths are found.  People who are at an increased risk for hepatitis B should be screened for this virus. You are considered at high risk for hepatitis B if:  You were born in a country where hepatitis B occurs often. Talk with your health care provider about which countries are considered high risk.  Your parents were born in a high-risk country and you have not received a shot to protect against hepatitis B (hepatitis B  vaccine).  You have HIV or AIDS.  You use needles to inject street drugs.  You live with, or have sex with, someone who has hepatitis B.  You are a man who has sex with other men (MSM).  You get hemodialysis treatment.  You take certain medicines for conditions such as cancer, organ transplantation, and autoimmune conditions.  Hepatitis C blood testing is recommended for all people born from [birth year] through 1965 and any individual with known risks for hepatitis C.  Practice safe sex. Use condoms and avoid high-risk sexual practices to reduce the spread of sexually transmitted infections (STIs). STIs include gonorrhea, chlamydia, syphilis, trichomonas, herpes, HPV, and human immunodeficiency virus (HIV). Herpes, HIV, and HPV are viral illnesses that have no cure. They can result in disability, cancer, and death.  If you are at risk of being infected with HIV, it is recommended that you take a prescription medicine daily to prevent HIV infection. This is called preexposure prophylaxis (PrEP). You are considered at risk  if:  You are a man who has sex with other men (MSM) and have other risk factors.  You are a heterosexual man, are sexually active, and are at increased risk for HIV infection.  You take drugs by injection.  You are sexually active with a partner who has HIV.  Talk with your health care provider about whether you are at high risk of being infected with HIV. If you choose to begin PrEP, you should first be tested for HIV. You should then be tested every 3 months for as long as you are taking PrEP.  A one-time screening for abdominal aortic aneurysm (AAA) and surgical repair of large AAAs by ultrasound are recommended for men ages 12 to 78 years who are current or former smokers.  Healthy men should no longer receive prostate-specific antigen (PSA) blood tests as part of routine cancer screening. Talk with your health care provider about prostate cancer screening.  Testicular  cancer screening is not recommended for adult males who have no symptoms. Screening includes self-exam, a health care provider exam, and other screening tests. Consult with your health care provider about any symptoms you have or any concerns you have about testicular cancer.  Use sunscreen. Apply sunscreen liberally and repeatedly throughout the day. You should seek shade when your shadow is shorter than you. Protect yourself by wearing long sleeves, pants, a wide-brimmed hat, and sunglasses year round, whenever you are outdoors.  Once a month, do a whole-body skin exam, using a mirror to look at the skin on your back. Tell your health care provider about new moles, moles that have irregular borders, moles that are larger than a pencil eraser, or moles that have changed in shape or color.  Stay current with required vaccines (immunizations).  Influenza vaccine. All adults should be immunized every year.  Tetanus, diphtheria, and acellular pertussis (Td, Tdap) vaccine. An adult who has not previously received Tdap or who does not know his vaccine status should receive 1 dose of Tdap. This initial dose should be followed by tetanus and diphtheria toxoids (Td) booster doses every 10 years. Adults with an unknown or incomplete history of completing a 3-dose immunization series with Td-containing vaccines should begin or complete a primary immunization series including a Tdap dose. Adults should receive a Td booster every 10 years.  Varicella vaccine. An adult without evidence of immunity to varicella should receive 2 doses or a second dose if he has previously received 1 dose.  Human papillomavirus (HPV) vaccine. Males aged 4-21 years who have not received the vaccine previously should receive the 3-dose series. Males aged 22-26 years may be immunized. Immunization is recommended through the age of 62 years for any male who has sex with males and did not get any or all doses earlier. Immunization is  recommended for any person with an immunocompromised condition through the age of 42 years if he did not get any or all doses earlier. During the 3-dose series, the second dose should be obtained 4-8 weeks after the first dose. The third dose should be obtained 24 weeks after the first dose and 16 weeks after the second dose.  Zoster vaccine. One dose is recommended for adults aged 33 years or older unless certain conditions are present.  Measles, mumps, and rubella (MMR) vaccine. Adults born before [birth year] generally are considered immune to measles and mumps. Adults born in [birth year] or later should have 1 or more doses of MMR vaccine unless there is a contraindication to the  vaccine or there is laboratory evidence of immunity to each of the three diseases. A routine second dose of MMR vaccine should be obtained at least 28 days after the first dose for students attending postsecondary schools, health care workers, or international travelers. People who received inactivated measles vaccine or an unknown type of measles vaccine during 1963-1967 should receive 2 doses of MMR vaccine. People who received inactivated mumps vaccine or an unknown type of mumps vaccine before 1979 and are at high risk for mumps infection should consider immunization with 2 doses of MMR vaccine. Unvaccinated health care workers born before [birth year] who lack laboratory evidence of measles, mumps, or rubella immunity or laboratory confirmation of disease should consider measles and mumps immunization with 2 doses of MMR vaccine or rubella immunization with 1 dose of MMR vaccine.  Pneumococcal 13-valent conjugate (PCV13) vaccine. When indicated, a person who is uncertain of his immunization history and has no record of immunization should receive the PCV13 vaccine. An adult aged 23 years or older who has certain medical conditions and has not been previously immunized should receive 1 dose of PCV13 vaccine. This PCV13 should be followed with a dose  of pneumococcal polysaccharide (PPSV23) vaccine. The PPSV23 vaccine dose should be obtained at least 8 weeks after the dose of PCV13 vaccine. An adult aged 14 years or older who has certain medical conditions and previously received 1 or more doses of PPSV23 vaccine should receive 1 dose of PCV13. The PCV13 vaccine dose should be obtained 1 or more years after the last PPSV23 vaccine dose.  Pneumococcal polysaccharide (PPSV23) vaccine. When PCV13 is also indicated, PCV13 should be obtained first. All adults aged 27 years and older should be immunized. An adult younger than age 50 years who has certain medical conditions should be immunized. Any person who resides in a nursing home or long-term care facility should be immunized. An adult smoker should be immunized. People with an immunocompromised condition and certain other conditions should receive both PCV13 and PPSV23 vaccines. People with human immunodeficiency virus (HIV) infection should be immunized as soon as possible after diagnosis. Immunization during chemotherapy or radiation therapy should be avoided. Routine use of PPSV23 vaccine is not recommended for American Indians, [place] Natives, or people younger than 65 years unless there are medical conditions that require PPSV23 vaccine. When indicated, people who have unknown immunization and have no record of immunization should receive PPSV23 vaccine. One-time revaccination 5 years after the first dose of PPSV23 is recommended for people aged 19-64 years who have chronic kidney failure, nephrotic syndrome, asplenia, or immunocompromised conditions. People who received 1-2 doses of PPSV23 before age 5 years should receive another dose of PPSV23 vaccine at age 95 years or later if at least 5 years have passed since the previous dose. Doses of PPSV23 are not needed for people immunized with PPSV23 at or after age 32 years.  Meningococcal vaccine. Adults with asplenia or persistent complement component  deficiencies should receive 2 doses of quadrivalent meningococcal conjugate (MenACWY-D) vaccine. The doses should be obtained at least 2 months apart. Microbiologists working with certain meningococcal bacteria, [place] recruits, people at risk during an outbreak, and people who travel to or live in countries with a high rate of meningitis should be immunized. A first-year college student up through age 77 years who is living in a residence hall should receive a dose if he did not receive a dose on or after his 16th birthday. Adults who have certain high-risk conditions should  receive one or more doses of vaccine.  Hepatitis A vaccine. Adults who wish to be protected from this disease, have certain high-risk conditions, work with hepatitis A-infected animals, work in hepatitis A research labs, or travel to or work in countries with a high rate of hepatitis A should be immunized. Adults who were previously unvaccinated and who anticipate close contact with an international adoptee during the first 60 days after arrival in the [place] from a country with a high rate of hepatitis A should be immunized.  Hepatitis B vaccine. Adults should be immunized if they wish to be protected from this disease, have certain high-risk conditions, may be exposed to blood or other infectious body fluids, are household contacts or sex partners of hepatitis B positive people, are clients or workers in certain care facilities, or travel to or work in countries with a high rate of hepatitis B.  Haemophilus influenzae type b (Hib) vaccine. A previously unvaccinated person with asplenia or sickle cell disease or having a scheduled splenectomy should receive 1 dose of Hib vaccine. Regardless of previous immunization, a recipient of a hematopoietic stem cell transplant should receive a 3-dose series 6-12 months after his successful transplant. Hib vaccine is not recommended for adults with HIV infection. Preventive Service /  Frequency Ages 98 to 70  Blood pressure check.** / Every 1 to 2 years.  Lipid and cholesterol check.** / Every 5 years beginning at age 48.  Hepatitis C blood test.** / For any individual with known risks for hepatitis C.  Skin self-exam. / Monthly.  Influenza vaccine. / Every year.  Tetanus, diphtheria, and acellular pertussis (Tdap, Td) vaccine.** / Consult your health care provider. 1 dose of Td every 10 years.  Varicella vaccine.** / Consult your health care provider.  HPV vaccine. / 3 doses over 6 months, if 50 or younger.  Measles, mumps, rubella (MMR) vaccine.** / You need at least 1 dose of MMR if you were born in 1958 or later. You may also need a second dose.  Pneumococcal 13-valent conjugate (PCV13) vaccine.** / Consult your health care provider.  Pneumococcal polysaccharide (PPSV23) vaccine.** / 1 to 2 doses if you smoke cigarettes or if you have certain conditions.  Meningococcal vaccine.** / 1 dose if you are age 37 to 58 years and a Market researcher living in a residence hall, or have one of several medical conditions. You may also need additional booster doses.  Hepatitis A vaccine.** / Consult your health care provider.  Hepatitis B vaccine.** / Consult your health care provider.  Haemophilus influenzae type b (Hib) vaccine.** / Consult your health care provider. Ages 15 to 94  Blood pressure check.** / Every 1 to 2 years.  Lipid and cholesterol check.** / Every 5 years beginning at age 23.  Lung cancer screening. / Every year if you are aged 13-80 years and have a 30-pack-year history of smoking and currently smoke or have quit within the past 15 years. Yearly screening is stopped once you have quit smoking for at least 15 years or develop a health problem that would prevent you from having lung cancer treatment.  Fecal occult blood test (FOBT) of stool. / Every year beginning at age 65 and continuing until age 39. You may not have to do this test  if you get a colonoscopy every 10 years.  Flexible sigmoidoscopy** or colonoscopy.** / Every 5 years for a flexible sigmoidoscopy or every 10 years for a colonoscopy beginning at age 69 and continuing  until age 61.  Hepatitis C blood test.** / For all people born from [birth year] through 1966 and any individual with known risks for hepatitis C.  Skin self-exam. / Monthly.  Influenza vaccine. / Every year.  Tetanus, diphtheria, and acellular pertussis (Tdap/Td) vaccine.** / Consult your health care provider. 1 dose of Td every 10 years.  Varicella vaccine.** / Consult your health care provider.  Zoster vaccine.** / 1 dose for adults aged 84 years or older.  Measles, mumps, rubella (MMR) vaccine.** / You need at least 1 dose of MMR if you were born in 1958 or later. You may also need a second dose.  Pneumococcal 13-valent conjugate (PCV13) vaccine.** / Consult your health care provider.  Pneumococcal polysaccharide (PPSV23) vaccine.** / 1 to 2 doses if you smoke cigarettes or if you have certain conditions.  Meningococcal vaccine.** / Consult your health care provider.  Hepatitis A vaccine.** / Consult your health care provider.  Hepatitis B vaccine.** / Consult your health care provider.  Haemophilus influenzae type b (Hib) vaccine.** / Consult your health care provider. Ages 5 and over  Blood pressure check.** / Every 1 to 2 years.  Lipid and cholesterol check.**/ Every 5 years beginning at age 33.  Lung cancer screening. / Every year if you are aged 24-80 years and have a 30-pack-year history of smoking and currently smoke or have quit within the past 15 years. Yearly screening is stopped once you have quit smoking for at least 15 years or develop a health problem that would prevent you from having lung cancer treatment.  Fecal occult blood test (FOBT) of stool. / Every year beginning at age 72 and continuing until age 91. You may not have to do this test if you get a colonoscopy  every 10 years.  Flexible sigmoidoscopy** or colonoscopy.** / Every 5 years for a flexible sigmoidoscopy or every 10 years for a colonoscopy beginning at age 82 and continuing until age 52.  Hepatitis C blood test.** / For all people born from [birth year] through 1966 and any individual with known risks for hepatitis C.  Abdominal aortic aneurysm (AAA) screening.** / A one-time screening for ages 74 to 45 years who are current or former smokers.  Skin self-exam. / Monthly.  Influenza vaccine. / Every year.  Tetanus, diphtheria, and acellular pertussis (Tdap/Td) vaccine.** / 1 dose of Td every 10 years.  Varicella vaccine.** / Consult your health care provider.  Zoster vaccine.** / 1 dose for adults aged 43 years or older.  Pneumococcal 13-valent conjugate (PCV13) vaccine.** / Consult your health care provider.  Pneumococcal polysaccharide (PPSV23) vaccine.** / 1 dose for all adults aged 30 years and older.  Meningococcal vaccine.** / Consult your health care provider.  Hepatitis A vaccine.** / Consult your health care provider.  Hepatitis B vaccine.** / Consult your health care provider.  Haemophilus influenzae type b (Hib) vaccine.** / Consult your health care provider. **Family history and personal history of risk and conditions may change your health care provider's recommendations. Document Released: 11/18/2001 Document Revised: 09/27/2013 Document Reviewed: 02/17/2011 [facility] Patient Information 2015 [place], [place]. This information is not intended to replace advice given to you by your health care provider. Make sure you discuss any questions you have with your health care provider.

## 2015-06-06 NOTE — Progress Notes (Signed)
Subjective:    Patient ID: Kevin Wiggins, male    DOB: 03/20/1961, 54 y.o.   MRN: [id number]  HPI  Pt in for CPE. He is fasting.  Pt states he is exercising around the house only. He takes care of 2 houses. Pt admits he is eating moderate healthy. He is eating a lot of vegetables. He is cutting back on his portions.(Pt states his weight increases when he uses testosterone injections). No caffeine beverages. He is getting Married on July 09, 2015.  Pt never had a colonoscopy.  Overdue on hiv screening.  Pt states does not take flu vaccines. Pt willing to get pneumovaccine.(With his diabetes and heart disease hx do recommend. Particularly since decline fluvaccine). Pt needs psa today.  Up to date on tetanus.  For medical conditions he does need diabetic eye check.  Will do foot exam today.       Review of Systems  Constitutional: Negative for fever, chills and fatigue.  Respiratory: Negative for cough, chest tightness, shortness of breath and wheezing.   Cardiovascular: Negative for chest pain and palpitations.  Gastrointestinal: Negative for nausea, vomiting, abdominal pain and blood in stool.  Endocrine: Negative for polydipsia, polyphagia and polyuria.  Musculoskeletal: Negative for myalgias, back pain and neck stiffness.  Skin: Negative for rash.  Neurological: Negative for dizziness, speech difficulty, weakness, numbness and headaches.  Hematological: Negative for adenopathy. Does not bruise/bleed easily.  Psychiatric/Behavioral: Negative for behavioral problems and confusion.   Past Medical History  Diagnosis Date  . Refusal of blood transfusions as patient is Jehovah's Witness   . Diastolic CHF     a. Acute diastolic CHF 20/2542 in the setting of perimembranous VSD. Eval underway given mod pulm HTN and RV dilation.  . Borderline diabetic     a. A1C 6.6 in 07/2013 - needs to f/u PCP for likely full DM.  [name] Hemochromatosis     H63D homozyous mutation  . VSD  (ventricular septal defect), perimembranous     a. Dx 07/2013 with significant L-R shunt.  . Pulmonary HTN     a. 07/2013: Moderate pulmonary HTN with RV dilitation.  [name] CAD (coronary artery disease)     a. 07/2013: mild nonobstructive CAD by cath 07/25/13 as part of eval for VSD/SOB.  . CKD (chronic kidney disease)   . RBBB   . LAFB (left anterior fascicular block)   . Hypogonadism male   . H/O: hypothyroidism   . ED (erectile dysfunction)   . Leukocytosis     secondary to testosterone replacement  . Erythrocytosis     secondary to testosterone replacement  . Diabetes mellitus without complication   . GERD (gastroesophageal reflux disease)   . Heart murmur   . Sleep apnea   . Hyperlipidemia   . Hypertension     Social History   Social History  . Marital Status: Divorced    Spouse Name: N/A  . Number of Children: N/A  . Years of Education: N/A   Occupational History  .      Maintenance    Social History Main Topics  . Smoking status: Never Smoker   . Smokeless tobacco: Never Used     Comment: never used tobacco  . Alcohol Use: No     Comment: socially  . Drug Use: No  . Sexual Activity: Not on file   Other Topics Concern  . Not on file   Social History Narrative    Past Surgical History  Procedure Laterality Date  .  Tee without cardioversion N/A 07/25/2013    Procedure: TRANSESOPHAGEAL ECHOCARDIOGRAM (TEE);  Surgeon: Dorothy Spark, MD;  Location: [facility] ENDOSCOPY;  Service: Cardiovascular;  Laterality: N/A;  . Phlebotomy    . Left and right heart catheterization with coronary angiogram N/A 07/25/2013    Procedure: LEFT AND RIGHT HEART CATHETERIZATION WITH CORONARY ANGIOGRAM;  Surgeon: Jolaine Artist, MD;  Location: [facility];  Service: Cardiovascular;  Laterality: N/A;  . Cardiac surgery      Hole patched    Family History  Problem Relation Age of Onset  . Cancer Mother   . Hypertension Father   . Congestive Heart Failure Father   .  Hemochromatosis      family history    No Known Allergies  Current Outpatient Prescriptions on File Prior to Visit  Medication Sig Dispense Refill  . aspirin 81 MG EC tablet Take 81 mg by mouth daily. Often forgets    . atorvastatin (LIPITOR) 20 MG tablet Take 1 tablet (20 mg total) by mouth daily. 30 tablet 3  . fluticasone (FLONASE) 50 MCG/ACT nasal spray Place 2 sprays into both nostrils daily. 16 g 1  . loratadine (CLARITIN) 10 MG tablet Take 1 tablet (10 mg total) by mouth daily. 30 tablet 1  . losartan (COZAAR) 50 MG tablet Take 1 tablet (50 mg total) by mouth daily. 90 tablet 2  . ondansetron (ZOFRAN ODT) 8 MG disintegrating tablet Take 1 tablet (8 mg total) by mouth every 8 (eight) hours as needed for nausea or vomiting. 12 tablet 0  . ranitidine (ZANTAC) 150 MG capsule Take 1 capsule (150 mg total) by mouth 2 (two) times daily. 60 capsule 0  . testosterone cypionate (DEPOTESTOTERONE CYPIONATE) 200 MG/ML injection Inject 1.5 mLs (300 mg total) into the muscle once. 20 mL 3   No current facility-administered medications on file prior to visit.    BP 122/80 mmHg  Pulse 89  Temp(Src) 98.4 F (36.9 C) (Oral)  Resp 16  Ht 5\' 9"  (1.753 m)  Wt 283 lb (128.368 kg)  BMI 41.77 kg/m2  SpO2 98%       Objective:   Physical Exam   General Mental Status- Alert. Orientation- Oriented x3.  Build and Nutrition- Well nourished and Well Developed.  Skin General:-Normal. Color- Normal color. Moisture- Normal. Temperature-Warm.  HEENT  Ears- Normal. Auditory Canal- Bilateral-Normal. Tympanic Membrane- Bilateral-Normal. Eye Fundi-Bilateral-Normal. Pupil- bilateral- Direct reaction to light normal. Nose & Sinuses- Normal. Nostrils-Bilateral- Normal. Mouth & Throat-Normal.  Neck Neck- No Bruits or Masses. Trachea midline.  Thyroid- Normal.  Chest and Lung Exam Percussion: Quality and Intensity-Percussion normal. Percussion of the chest reveals- No Dullness.  Palpation:  Palpation of the chest reveals- Non-tender- No dullness. Auscultation: Breath Sounds- Normal.  Adventitous Sounds:-No adventitious sounds.  Cardiovascular Inspection:- No Heaves. Auscultation:-RRR  Abdomen Inspection:-Inspection Normal. Inspection of the abdomen reveals- No hernias Palpation/Percussion:- Palpation and Percussion of the Abdomen reveal- Non Tender and No Palpable abdominal masses. Liver: Other Characteristics- No hepatomegaly. Spleen:Other Characteristics- No Splenomegaly. Auscultation:- Auscultation of the abdomen reveals- Bowel sounds normal and No Abdominal bruits.  Male Genitourinary Urethra:- No discharge. Penis- Circumcised. Scrotum- No masses. Testes- Bilateral-Normal.  Rectal Anorectal Exam: Performed- Normal sphincter tone. No masses noted. Prostate smooth normal size. Stool HEME Negative.  Peripheral  Vascular Lower Extremity:Inspection- Bilateral-Inspection Normal  Palpation: Femoral pulse- Bilateral- 2+. Popliteal pulse- Bilateral-2+. Dorsalis pedis pulse- Bilateral- 1/2+. Edema- Bilateral- No edema.  Neurologic Mental Status:- Normal. Cranial Nerves:-Normal Bilaterally. Motor:-Normal. Strength:5/5 normal muscle strength-All Muscles.  Coordination-Normal. Gait- Normal.  Meningeal Signs- None.  Musculoskeletal Global Assessment General-Joints show full range of motion without obvious deformity and Normal muscle mass. Strength in upper and lower extremities.  Lymphatics General lymphatics Description- No generalized lymphadenopathy.     Assessment & Plan:

## 2015-06-06 NOTE — Addendum Note (Signed)
Addended by: Tasia Catchings on: 06/06/2015 10:35 AM   Modules accepted: Orders

## 2015-06-06 NOTE — Progress Notes (Signed)
Pre visit review using our clinic review tool, if applicable. No additional management support is needed unless otherwise documented below in the visit note. 

## 2015-06-06 NOTE — Assessment & Plan Note (Signed)
Cbc, cmp, tsh, lipid panel, psa, order for screening colonosocopy. HIV screening. Pneuvmovaccine today.

## 2015-06-07 NOTE — Addendum Note (Signed)
Addended by: Tasia Catchings on: 06/07/2015 11:34 AM   Modules accepted: Orders

## 2015-06-18 ENCOUNTER — Other Ambulatory Visit: Payer: Self-pay | Admitting: Cardiology

## 2015-06-20 ENCOUNTER — Telehealth: Payer: Self-pay

## 2015-06-20 MED ORDER — CEFDINIR 300 MG PO CAPS: 300.0000 mg | ORAL_CAPSULE | Freq: Two times a day (BID) | ORAL | Status: DC

## 2015-06-20 MED ORDER — SILDENAFIL CITRATE 100 MG PO TABS: 100.0000 mg | ORAL_TABLET | Freq: Every day | ORAL | Status: DC | PRN

## 2015-06-20 NOTE — Telephone Encounter (Signed)
Kevin Wiggins please advise if ok to fill Viagra 100 mg tablets PRN. This is not on his current med list. Depotestesterone is on list and was ordered on  01/26/15.

## 2015-06-20 NOTE — Telephone Encounter (Signed)
I did talk with pt. He is not on cialis. Pt has used viagra and often effective for ED. No use of nitroglycerin. Pt states he has talked with his cardiologist and cardiologist ok'd use of the viagra. He is getting married on Jul 08, 2015.   Also he mentioned recent nasal congestion and some pressure. Some green mucous. He had for one week. He states mentioned this to me on his last visit. Will rx cefdnir. Ask that he come in next week if not improving.

## 2015-06-21 ENCOUNTER — Ambulatory Visit (HOSPITAL_BASED_OUTPATIENT_CLINIC_OR_DEPARTMENT_OTHER): Payer: BLUE CROSS/BLUE SHIELD | Admitting: Hematology & Oncology

## 2015-06-21 ENCOUNTER — Encounter: Payer: Self-pay | Admitting: Hematology & Oncology

## 2015-06-21 ENCOUNTER — Other Ambulatory Visit (HOSPITAL_BASED_OUTPATIENT_CLINIC_OR_DEPARTMENT_OTHER): Payer: BLUE CROSS/BLUE SHIELD

## 2015-06-21 ENCOUNTER — Ambulatory Visit (HOSPITAL_BASED_OUTPATIENT_CLINIC_OR_DEPARTMENT_OTHER): Payer: BLUE CROSS/BLUE SHIELD

## 2015-06-21 DIAGNOSIS — E349 Endocrine disorder, unspecified: Secondary | ICD-10-CM

## 2015-06-21 DIAGNOSIS — E291 Testicular hypofunction: Secondary | ICD-10-CM | POA: Diagnosis not present

## 2015-06-21 LAB — COMPREHENSIVE METABOLIC PANEL (CC13)
ALT: 52 U/L (ref 0–55)
AST: 27 U/L (ref 5–34)
Albumin: 4.1 g/dL (ref 3.5–5.0)
Alkaline Phosphatase: 71 U/L (ref 40–150)
Anion Gap: 8 mEq/L (ref 3–11)
BUN: 19.7 mg/dL (ref 7.0–26.0)
CALCIUM: 9.9 mg/dL (ref 8.4–10.4)
CHLORIDE: 103 meq/L (ref 98–109)
CO2: 29 mEq/L (ref 22–29)
Creatinine: 1.8 mg/dL — ABNORMAL HIGH (ref 0.7–1.3)
EGFR: 41 mL/min/{1.73_m2} — ABNORMAL LOW (ref >90)
Glucose: 207 mg/dl — ABNORMAL HIGH (ref 70–140)
POTASSIUM: 4.9 meq/L (ref 3.5–5.1)
Sodium: 140 mEq/L (ref 136–145)
Total Bilirubin: 0.74 mg/dL (ref 0.20–1.20)
Total Protein: 7.6 g/dL (ref 6.4–8.3)

## 2015-06-21 LAB — CBC WITH DIFFERENTIAL (CANCER CENTER ONLY)
BASO#: 0 10*3/uL (ref 0.0–0.2)
BASO%: 0.4 % (ref 0.0–2.0)
EOS ABS: 0.4 10*3/uL (ref 0.0–0.5)
EOS%: 4.8 % (ref 0.0–7.0)
HCT: 48.5 % (ref 38.7–49.9)
HEMOGLOBIN: 16.9 g/dL (ref 13.0–17.1)
LYMPH#: 1.9 10*3/uL (ref 0.9–3.3)
LYMPH%: 22 % (ref 14.0–48.0)
MCH: 30.8 pg (ref 28.0–33.4)
MCHC: 34.8 g/dL (ref 32.0–35.9)
MCV: 89 fL (ref 82–98)
MONO#: 0.9 10*3/uL (ref 0.1–0.9)
MONO%: 10.2 % (ref 0.0–13.0)
NEUT#: 5.3 10*3/uL (ref 1.5–6.5)
NEUT%: 62.6 % (ref 40.0–80.0)
PLATELETS: 231 10*3/uL (ref 145–400)
RBC: 5.48 10*6/uL (ref 4.20–5.70)
RDW: 14.5 % (ref 11.1–15.7)
WBC: 8.5 10*3/uL (ref 4.0–10.0)

## 2015-06-21 LAB — RETICULOCYTES (CHCC)
ABS RETIC: 97.2 10*3/uL (ref 19.0–186.0)
RBC.: 5.4 MIL/uL (ref 4.22–5.81)
RETIC CT PCT: 1.8 % (ref 0.4–2.3)

## 2015-06-21 NOTE — Patient Instructions (Signed)

## 2015-06-21 NOTE — Progress Notes (Signed)
Kevin Wiggins presents today for phlebotomy per MD orders. Phlebotomy procedure started at 1535 and ended at 1545. 500 grams removed. Patient observed for 30 minutes after procedure without any incident. Patient tolerated procedure well. IV needle removed intact.

## 2015-06-21 NOTE — Progress Notes (Signed)
Hematology and Oncology Follow Up Visit  Kevin Wiggins [id number] 1961-08-26 54 y.o. 06/21/2015   Principle Diagnosis:  1.  Hemochromatosis (homozygous for H63D mutation). 2. Hypogonadism secondary to hemochromatosis. 3. Erythrocytosis secondary to testosterone therapy. 4. Ruptured right sinus of Valsalva aneurysm.  Current Therapy:   1. The patient status post cardiac surgery to repair the sinus of     Valsalva aneurysm. 2. Phlebotomy to keep hematocrit below 48%. 3. Testosterone 200 mg IM q.2 weeks.     Interim History:  Mr.  Kevin Wiggins is back for followup. He's doing fantastic. He is getting ready for his wedding. This will be October 3. It will be a private ceremony for this he and his fiance in [place].  He has had no problems with his heart. He went to go see his family doctor recently. Everything looked real good.  We went ahead and checked his iron studies back in July. His ferritin was 60. His iron saturation was 45%.  He's had no nausea or vomiting. He's had no headache. He's had no change in bowel or bladder habits.  His been no rashes. He's had no leg swelling.  His nce status is ECOG 1.  Medications:  Current outpatient prescriptions:  .  aspirin 81 MG EC tablet, Take 81 mg by mouth daily. Often forgets, Disp: , Rfl:  .  atorvastatin (LIPITOR) 20 MG tablet, TAKE 1 TABLET (20 MG) BY MOUTH DAILY., Disp: 30 tablet, Rfl: 3 .  cefdinir (OMNICEF) 300 MG capsule, Take 1 capsule (300 mg total) by mouth 2 (two) times daily., Disp: 20 capsule, Rfl: 0 .  fluticasone (FLONASE) 50 MCG/ACT nasal spray, Place 2 sprays into both nostrils daily., Disp: 16 g, Rfl: 1 .  loratadine (CLARITIN) 10 MG tablet, Take 1 tablet (10 mg total) by mouth daily., Disp: 30 tablet, Rfl: 1 .  losartan (COZAAR) 50 MG tablet, Take 1 tablet (50 mg total) by mouth daily., Disp: 90 tablet, Rfl: 2 .  ondansetron (ZOFRAN ODT) 8 MG disintegrating tablet, Take 1 tablet (8 mg total) by mouth every 8 (eight) hours  as needed for nausea or vomiting., Disp: 12 tablet, Rfl: 0 .  ranitidine (ZANTAC) 150 MG capsule, Take 1 capsule (150 mg total) by mouth 2 (two) times daily., Disp: 60 capsule, Rfl: 0 .  ranitidine (ZANTAC) 150 MG tablet, , Disp: , Rfl:  .  sildenafil (VIAGRA) 100 MG tablet, Take 1 tablet (100 mg total) by mouth daily as needed for erectile dysfunction., Disp: 10 tablet, Rfl: 0 .  testosterone cypionate (DEPOTESTOTERONE CYPIONATE) 200 MG/ML injection, Inject 1.5 mLs (300 mg total) into the muscle once., Disp: 20 mL, Rfl: 3  Allergies:  No Known Allergies  Past Medical History, Surgical history, Social history, and Family History were reviewed and updated.  Review of Systems: As above  Physical Exam:  height is 5\' 9"  (1.753 m) and weight is 282 lb (127.914 kg). His oral temperature is 98 F (36.7 C). His blood pressure is 149/88 and his pulse is 97. His respiration is 16.   Well-developed and well-nourished white gentleman. Head and neck exam shows no ocular or oral lesions. He has no conjunctival inflammation. He has no palpable cervical or supraclavicular lymph nodes. Lungs are clear. Cardiac exam regular rate and rhythm with no murmurs rubs or bruits. Abdomen is soft. He is moderately obese. There is no fluid wave. There is no palpable liver or spleen tip. Back exam shows no tenderness over the spine ribs or hips. Extremities  shows no clubbing cyanosis or edema. Neurological exam shows no focal neurological deficits. Skin exam no rashes, ecchymoses or petechia.   Lab Results  Component Value Date   WBC 8.5 06/21/2015   HGB 16.9 06/21/2015   HCT 48.5 06/21/2015   MCV 89 06/21/2015   PLT 231 06/21/2015     Chemistry      Component Value Date/Time   NA 140 06/21/2015 1428   NA 138 06/06/2015 0927   NA 142 12/27/2014 1401   K 4.9 06/21/2015 1428   K 4.2 06/06/2015 0927   K 4.3 12/27/2014 1401   CL 100 06/06/2015 0927   CL 100 12/27/2014 1401   CO2 29 06/21/2015 1428   CO2 30  06/06/2015 0927   CO2 28 12/27/2014 1401   BUN 19.7 06/21/2015 1428   BUN 12 06/06/2015 0927   BUN 15 12/27/2014 1401   CREATININE 1.8* 06/21/2015 1428   CREATININE 1.20 06/06/2015 0927   CREATININE 1.3* 12/27/2014 1401      Component Value Date/Time   CALCIUM 9.9 06/21/2015 1428   CALCIUM 9.4 06/06/2015 0927   CALCIUM 9.1 12/27/2014 1401   ALKPHOS 71 06/21/2015 1428   ALKPHOS 65 06/06/2015 0927   ALKPHOS 49 12/27/2014 1401   AST 27 06/21/2015 1428   AST 38* 06/06/2015 0927   AST 27 12/27/2014 1401   ALT 52 06/21/2015 1428   ALT 64* 06/06/2015 0927   ALT 51* 12/27/2014 1401   BILITOT 0.74 06/21/2015 1428   BILITOT 0.8 06/06/2015 0927   BILITOT 0.80 12/27/2014 1401         Impression and Plan: [name] is 54 year old white male.Kevin Wiggins He has a homozygous mutation for one of the minor genes for hemochromatosis.    His hemoglobin and hematocrit are up a little bit. He clearly needs to be phlebotomized. We will do this today.  He will be in married October 3. I will make sure that everything is okay for him so that he can enjoy his wedding day. He and his fiance will be married in a private ceremony in [place].   We will plan to get him back in another 6 weeks or so.  [name], MD 9/15/20164:44 PM

## 2015-06-22 ENCOUNTER — Other Ambulatory Visit: Payer: Self-pay

## 2015-06-22 LAB — IRON AND TIBC CHCC
%SAT: 45 % (ref 20–55)
Iron: 149 ug/dL (ref 42–163)
TIBC: 329 ug/dL (ref 202–409)
UIBC: 180 ug/dL (ref 117–376)

## 2015-06-22 LAB — FERRITIN CHCC: FERRITIN: 34 ng/mL (ref 22–316)

## 2015-06-22 MED ORDER — RANITIDINE HCL 150 MG PO TABS: 150.0000 mg | ORAL_TABLET | Freq: Two times a day (BID) | ORAL | Status: DC

## 2015-07-03 ENCOUNTER — Telehealth: Payer: Self-pay | Admitting: Hematology & Oncology

## 2015-07-03 NOTE — Telephone Encounter (Signed)
CaseId:[id number];Product Name:Injectable testosterone products (Depo-Testosterone, Delatestryl, Testopel, testosterone cypionate, testosterone enanthate, Aveed) 75 PA - [employer];Status:Approved;Coverage Start Date:06/03/2015;Coverage End Date:07/02/2016

## 2015-07-19 MED ORDER — RANITIDINE HCL 150 MG PO CAPS: 150.0000 mg | ORAL_CAPSULE | Freq: Two times a day (BID) | ORAL | Status: DC

## 2015-07-19 NOTE — Addendum Note (Signed)
Addended by: Tasia Catchings on: 07/19/2015 01:40 PM   Modules accepted: Orders

## 2015-07-20 ENCOUNTER — Ambulatory Visit (AMBULATORY_SURGERY_CENTER): Payer: Self-pay

## 2015-07-20 VITALS — Ht 69.0 in | Wt 281.8 lb

## 2015-07-20 DIAGNOSIS — Z1211 Encounter for screening for malignant neoplasm of colon: Secondary | ICD-10-CM

## 2015-07-20 MED ORDER — MOVIPREP 100 G PO SOLR: 1.0000 | Freq: Once | ORAL | Status: DC

## 2015-07-20 NOTE — Progress Notes (Signed)
No allergies to eggs or soy No past problems with anesthesia No diet/weight loss meds No home oxygen  Refused emmi 

## 2015-07-26 ENCOUNTER — Ambulatory Visit (HOSPITAL_BASED_OUTPATIENT_CLINIC_OR_DEPARTMENT_OTHER): Payer: BLUE CROSS/BLUE SHIELD | Admitting: Hematology & Oncology

## 2015-07-26 ENCOUNTER — Ambulatory Visit: Payer: BLUE CROSS/BLUE SHIELD

## 2015-07-26 ENCOUNTER — Other Ambulatory Visit (HOSPITAL_BASED_OUTPATIENT_CLINIC_OR_DEPARTMENT_OTHER): Payer: BLUE CROSS/BLUE SHIELD

## 2015-07-26 ENCOUNTER — Encounter: Payer: Self-pay | Admitting: Hematology & Oncology

## 2015-07-26 DIAGNOSIS — E349 Endocrine disorder, unspecified: Secondary | ICD-10-CM

## 2015-07-26 DIAGNOSIS — D751 Secondary polycythemia: Secondary | ICD-10-CM

## 2015-07-26 DIAGNOSIS — E291 Testicular hypofunction: Secondary | ICD-10-CM

## 2015-07-26 DIAGNOSIS — N529 Male erectile dysfunction, unspecified: Secondary | ICD-10-CM

## 2015-07-26 DIAGNOSIS — I5032 Chronic diastolic (congestive) heart failure: Secondary | ICD-10-CM

## 2015-07-26 DIAGNOSIS — Q210 Ventricular septal defect: Secondary | ICD-10-CM

## 2015-07-26 LAB — COMPREHENSIVE METABOLIC PANEL
ALBUMIN: 4.4 g/dL (ref 3.6–5.1)
ALK PHOS: 63 U/L (ref 40–115)
ALT: 57 U/L — AB (ref 9–46)
AST: 25 U/L (ref 10–35)
BUN: 16 mg/dL (ref 7–25)
CALCIUM: 9.9 mg/dL (ref 8.6–10.3)
CO2: 29 mmol/L (ref 20–31)
Chloride: 101 mmol/L (ref 98–110)
Creatinine, Ser: 1.31 mg/dL (ref 0.70–1.33)
Glucose, Bld: 225 mg/dL — ABNORMAL HIGH (ref 65–99)
POTASSIUM: 4.8 mmol/L (ref 3.5–5.3)
Sodium: 140 mmol/L (ref 135–146)
TOTAL PROTEIN: 7.4 g/dL (ref 6.1–8.1)
Total Bilirubin: 0.6 mg/dL (ref 0.2–1.2)

## 2015-07-26 LAB — CBC WITH DIFFERENTIAL (CANCER CENTER ONLY)
BASO#: 0.1 10*3/uL (ref 0.0–0.2)
BASO%: 0.6 % (ref 0.0–2.0)
EOS ABS: 0.6 10*3/uL — AB (ref 0.0–0.5)
EOS%: 7.1 % — ABNORMAL HIGH (ref 0.0–7.0)
HEMATOCRIT: 47.4 % (ref 38.7–49.9)
HEMOGLOBIN: 16.4 g/dL (ref 13.0–17.1)
LYMPH#: 1.8 10*3/uL (ref 0.9–3.3)
LYMPH%: 21.1 % (ref 14.0–48.0)
MCH: 30.7 pg (ref 28.0–33.4)
MCHC: 34.6 g/dL (ref 32.0–35.9)
MCV: 89 fL (ref 82–98)
MONO#: 0.8 10*3/uL (ref 0.1–0.9)
MONO%: 9.8 % (ref 0.0–13.0)
NEUT%: 61.4 % (ref 40.0–80.0)
NEUTROS ABS: 5.2 10*3/uL (ref 1.5–6.5)
PLATELETS: 235 10*3/uL (ref 145–400)
RBC: 5.35 10*6/uL (ref 4.20–5.70)
RDW: 13.6 % (ref 11.1–15.7)
WBC: 8.5 10*3/uL (ref 4.0–10.0)

## 2015-07-26 MED ORDER — TESTOSTERONE CYPIONATE 200 MG/ML IM SOLN: 300.0000 mg | Freq: Once | INTRAMUSCULAR | Status: DC

## 2015-07-26 NOTE — Progress Notes (Signed)
Per Dr Marin Olp no phlebotomy needed.

## 2015-07-26 NOTE — Progress Notes (Signed)
Hematology and Oncology Follow Up Visit  Kevin Wiggins [id number] 24-Jan-1961 54 y.o. 07/26/2015   Principle Diagnosis:  1.  Hemochromatosis (homozygous for H63D mutation). 2. Hypogonadism secondary to hemochromatosis. 3. Erythrocytosis secondary to testosterone therapy. 4. Ruptured right sinus of Valsalva aneurysm.  Current Therapy:   1. The patient status post cardiac surgery to repair the sinus of     Valsalva aneurysm. 2. Phlebotomy to keep hematocrit below 48%. 3. Testosterone 200 mg IM q.2 weeks.     Interim History:  [name] is back for followup. He is now married. He married October 3. He married in [place].  He showed me the pictures. He and his wife really had a nice time. He had arranged this so that it would be a very private ceremony.  His last iron studies done back in September showed a ferritin of 34. His iron saturation was 45%.  Overall, he feels well. He's had no headache. His performance status is ECOG 1.  Medications:  Current outpatient prescriptions:  .  atorvastatin (LIPITOR) 20 MG tablet, TAKE 1 TABLET (20 MG) BY MOUTH DAILY., Disp: 30 tablet, Rfl: 3 .  losartan (COZAAR) 50 MG tablet, Take 1 tablet (50 mg total) by mouth daily., Disp: 90 tablet, Rfl: 2 .  MOVIPREP 100 G SOLR, Take 1 kit (200 g total) by mouth once., Disp: 1 kit, Rfl: 0 .  ranitidine (ZANTAC) 150 MG capsule, Take 1 capsule (150 mg total) by mouth 2 (two) times daily., Disp: 60 capsule, Rfl: 0 .  sildenafil (VIAGRA) 100 MG tablet, Take 1 tablet (100 mg total) by mouth daily as needed for erectile dysfunction., Disp: 10 tablet, Rfl: 0 .  testosterone cypionate (DEPOTESTOTERONE CYPIONATE) 200 MG/ML injection, Inject 1.5 mLs (300 mg total) into the muscle once. (Patient taking differently: Inject 300 mg into the muscle once. As needed now), Disp: 20 mL, Rfl: 3  Allergies:  No Known Allergies  Past Medical History, Surgical history, Social history, and Family History were reviewed and  updated.  Review of Systems: As above  Physical Exam:  height is '5\' 9"'  (1.753 m) and weight is 280 lb (127.007 kg). His oral temperature is 97.4 F (36.3 C). His blood pressure is 136/83 and his pulse is 88. His respiration is 16.   Well-developed and well-nourished white gentleman. Head and neck exam shows no ocular or oral lesions. He has no conjunctival inflammation. He has no palpable cervical or supraclavicular lymph nodes. Lungs are clear. Cardiac exam regular rate and rhythm with no murmurs rubs or bruits. Abdomen is soft. He is moderately obese. There is no fluid wave. There is no palpable liver or spleen tip. Back exam shows no tenderness over the spine ribs or hips. Extremities shows no clubbing cyanosis or edema. Neurological exam shows no focal neurological deficits. Skin exam no rashes, ecchymoses or petechia.   Lab Results  Component Value Date   WBC 8.5 07/26/2015   HGB 16.4 07/26/2015   HCT 47.4 07/26/2015   MCV 89 07/26/2015   PLT 235 07/26/2015     Chemistry      Component Value Date/Time   NA 140 06/21/2015 1428   NA 138 06/06/2015 0927   NA 142 12/27/2014 1401   K 4.9 06/21/2015 1428   K 4.2 06/06/2015 0927   K 4.3 12/27/2014 1401   CL 100 06/06/2015 0927   CL 100 12/27/2014 1401   CO2 29 06/21/2015 1428   CO2 30 06/06/2015 0927   CO2 28  12/27/2014 1401   BUN 19.7 06/21/2015 1428   BUN 12 06/06/2015 0927   BUN 15 12/27/2014 1401   CREATININE 1.8* 06/21/2015 1428   CREATININE 1.20 06/06/2015 0927   CREATININE 1.3* 12/27/2014 1401      Component Value Date/Time   CALCIUM 9.9 06/21/2015 1428   CALCIUM 9.4 06/06/2015 0927   CALCIUM 9.1 12/27/2014 1401   ALKPHOS 71 06/21/2015 1428   ALKPHOS 65 06/06/2015 0927   ALKPHOS 49 12/27/2014 1401   AST 27 06/21/2015 1428   AST 38* 06/06/2015 0927   AST 27 12/27/2014 1401   ALT 52 06/21/2015 1428   ALT 64* 06/06/2015 0927   ALT 51* 12/27/2014 1401   BILITOT 0.74 06/21/2015 1428   BILITOT 0.8 06/06/2015 0927    BILITOT 0.80 12/27/2014 1401         Impression and Plan: Kevin Wiggins is 54 year old white male.[name] He has a homozygous mutation for one of the minor genes for hemochromatosis.    I answered glad that everything went well with his marriage. He and his new wife really aren't join each other.  He does not need to be phlebotomized today. Again we want to keep his hematocrit below 48%.  We will plan to get him back in another 6 weeks or so.  [name], MD 10/20/20164:35 PM

## 2015-07-27 ENCOUNTER — Other Ambulatory Visit: Payer: Self-pay

## 2015-07-27 DIAGNOSIS — E349 Endocrine disorder, unspecified: Secondary | ICD-10-CM

## 2015-07-27 DIAGNOSIS — N529 Male erectile dysfunction, unspecified: Secondary | ICD-10-CM

## 2015-07-27 DIAGNOSIS — I5032 Chronic diastolic (congestive) heart failure: Secondary | ICD-10-CM

## 2015-07-27 DIAGNOSIS — Q210 Ventricular septal defect: Secondary | ICD-10-CM

## 2015-07-27 LAB — IRON AND TIBC CHCC
%SAT: 43 % (ref 20–55)
Iron: 139 ug/dL (ref 42–163)
TIBC: 324 ug/dL (ref 202–409)
UIBC: 185 ug/dL (ref 117–376)

## 2015-07-27 LAB — FERRITIN CHCC: Ferritin: 31 ng/ml (ref 22–316)

## 2015-07-27 LAB — TESTOSTERONE: Testosterone: 215 ng/dL — ABNORMAL LOW (ref 300–890)

## 2015-07-27 MED ORDER — TESTOSTERONE CYPIONATE 200 MG/ML IM SOLN: 200.0000 mg | INTRAMUSCULAR | Status: DC

## 2015-08-03 ENCOUNTER — Encounter: Payer: Self-pay | Admitting: Gastroenterology

## 2015-08-03 ENCOUNTER — Ambulatory Visit (AMBULATORY_SURGERY_CENTER): Payer: BLUE CROSS/BLUE SHIELD | Admitting: Gastroenterology

## 2015-08-03 VITALS — BP 122/89 | HR 73 | Temp 97.5°F | Resp 20 | Ht 69.0 in | Wt 281.0 lb

## 2015-08-03 DIAGNOSIS — Z1211 Encounter for screening for malignant neoplasm of colon: Secondary | ICD-10-CM

## 2015-08-03 DIAGNOSIS — D120 Benign neoplasm of cecum: Secondary | ICD-10-CM | POA: Diagnosis not present

## 2015-08-03 DIAGNOSIS — D122 Benign neoplasm of ascending colon: Secondary | ICD-10-CM | POA: Diagnosis not present

## 2015-08-03 DIAGNOSIS — D125 Benign neoplasm of sigmoid colon: Secondary | ICD-10-CM | POA: Diagnosis not present

## 2015-08-03 MED ORDER — SODIUM CHLORIDE 0.9 % IV SOLN: 500.0000 mL | INTRAVENOUS | Status: DC

## 2015-08-03 NOTE — Progress Notes (Signed)
Called to room to assist during endoscopic procedure.  Patient ID and intended procedure confirmed with present staff. Received instructions for my participation in the procedure from the performing physician.  

## 2015-08-03 NOTE — Patient Instructions (Signed)
YOU HAD AN ENDOSCOPIC PROCEDURE TODAY AT THE [facility]:   Refer to the procedure report that was given to you for any specific questions about what was found during the examination.  If the procedure report does not answer your questions, please call your gastroenterologist to clarify.  If you requested that your care partner not be given the details of your procedure findings, then the procedure report has been included in a sealed envelope for you to review at your convenience later.  YOU SHOULD EXPECT: Some feelings of bloating in the abdomen. Passage of more gas than usual.  Walking can help get rid of the air that was put into your GI tract during the procedure and reduce the bloating. If you had a lower endoscopy (such as a colonoscopy or flexible sigmoidoscopy) you may notice spotting of blood in your stool or on the toilet paper. If you underwent a bowel prep for your procedure, you may not have a normal bowel movement for a few days.  Please Note:  You might notice some irritation and congestion in your nose or some drainage.  This is from the oxygen used during your procedure.  There is no need for concern and it should clear up in a day or so.  SYMPTOMS TO REPORT IMMEDIATELY:   Following lower endoscopy (colonoscopy or flexible sigmoidoscopy):  Excessive amounts of blood in the stool  Significant tenderness or worsening of abdominal pains  Swelling of the abdomen that is new, acute  Fever of 100F or higher   For urgent or emergent issues, a gastroenterologist can be reached at any hour by calling [phone].   DIET: Your first meal following the procedure should be a small meal and then it is ok to progress to your normal diet. Heavy or fried foods are harder to digest and may make you feel nauseous or bloated.  Likewise, meals heavy in dairy and vegetables can increase bloating.  Drink plenty of fluids but you should avoid alcoholic beverages for 24  hours.  ACTIVITY:  You should plan to take it easy for the rest of today and you should NOT DRIVE or use heavy machinery until tomorrow (because of the sedation medicines used during the test).    FOLLOW UP: Our staff will call the number listed on your records the next business day following your procedure to check on you and address any questions or concerns that you may have regarding the information given to you following your procedure. If we do not reach you, we will leave a message.  However, if you are feeling well and you are not experiencing any problems, there is no need to return our call.  We will assume that you have returned to your regular daily activities without incident.  If any biopsies were taken you will be contacted by phone or by letter within the next 1-3 weeks.  Please call us at [phone] if you have not heard about the biopsies in 3 weeks.    SIGNATURES/CONFIDENTIALITY: You and/or your care partner have signed paperwork which will be entered into your electronic medical record.  These signatures attest to the fact that that the information above on your After Visit Summary has been reviewed and is understood.  Full responsibility of the confidentiality of this discharge information lies with you and/or your care-partner. 

## 2015-08-03 NOTE — Progress Notes (Signed)
Report to PACU, RN, vss, BBS= Clear.  

## 2015-08-03 NOTE — Op Note (Signed)
[facility]  [employer]. [place], [address]   COLONOSCOPY PROCEDURE REPORT  PATIENT: Kevin, Wiggins  MR#: [id number] BIRTHDATE: Mar 09, 1961 , 53  yrs. old GENDER: male ENDOSCOPIST: Milus Banister, MD REFERRED BY: Camelia Phenes, MD PROCEDURE DATE:  08/03/2015 PROCEDURE:   Colonoscopy, screening and Colonoscopy with snare polypectomy First Screening Colonoscopy - Avg.  risk and is 50 yrs.  old or older Yes.  Prior Negative Screening - Now for repeat screening. N/A  History of Adenoma - Now for follow-up colonoscopy & has been > or = to 3 yrs.  N/A  Polyps removed today? Yes ASA CLASS:   Class II INDICATIONS:Screening for colonic neoplasia and Colorectal Neoplasm Risk Assessment for this procedure is average risk. MEDICATIONS: Monitored anesthesia care and Propofol 200 mg IV  DESCRIPTION OF PROCEDURE:   After the risks benefits and alternatives of the procedure were thoroughly explained, informed consent was obtained.  The digital rectal exam revealed no abnormalities of the rectum.   The [id number]  endoscope was introduced through the anus and advanced to the cecum, which was identified by both the appendix and ileocecal valve. No adverse events experienced.   The quality of the prep was excellent.  The instrument was then slowly withdrawn as the colon was fully examined. Estimated blood loss is zero unless otherwise noted in this procedure report.    COLON FINDINGS: Three sessile polyps ranging between 3-60mm in size were found in the sigmoid colon, ascending colon, and at the cecum. Polypectomies were performed with a cold snare.  The resection was complete, the polyp tissue was completely retrieved and sent to histology.   The examination was otherwise normal.  Retroflexed views revealed no abnormalities. The time to cecum = 1.1 Withdrawal time = 8.3   The scope was withdrawn and the procedure completed. COMPLICATIONS: There were no immediate  complications.  ENDOSCOPIC IMPRESSION: 1.   Three sessile polyps ranging between 3-78mm in size were found in the sigmoid colon, ascending colon, and at the cecum; polypectomies were performed with a cold snare 2.   The examination was otherwise normal  RECOMMENDATIONS: If the polyp(s) removed today are proven to be adenomatous (pre-cancerous) polyps, you will need a colonoscopy in 3-5 years. Otherwise you should continue to follow colorectal cancer screening guidelines for "routine risk" patients with a colonoscopy in 10 years.  You will receive a letter within 1-2 weeks with the results of your biopsy as well as final recommendations.  Please call my office if you have not received a letter after 3 weeks.  eSigned:  Milus Banister, MD 08/03/2015 8:16 AM

## 2015-08-06 ENCOUNTER — Telehealth: Payer: Self-pay | Admitting: *Deleted

## 2015-08-06 NOTE — Telephone Encounter (Signed)
  Follow up Call-  Call back number 08/03/2015  Post procedure Call Back phone  # [phone]  Permission to leave phone message Yes     Patient questions:  Do you have a fever, pain , or abdominal swelling? No. Pain Score  0 *  Have you tolerated food without any problems? Yes.    Have you been able to return to your normal activities? Yes.    Do you have any questions about your discharge instructions: Diet   No. Medications  No. Follow up visit  No.  Do you have questions or concerns about your Care? No.  Actions: * If pain score is 4 or above: No action needed, pain <4.

## 2015-08-10 ENCOUNTER — Encounter: Payer: Self-pay | Admitting: Gastroenterology

## 2015-08-20 ENCOUNTER — Other Ambulatory Visit (INDEPENDENT_AMBULATORY_CARE_PROVIDER_SITE_OTHER): Payer: BLUE CROSS/BLUE SHIELD

## 2015-08-20 ENCOUNTER — Other Ambulatory Visit: Payer: BLUE CROSS/BLUE SHIELD

## 2015-08-20 DIAGNOSIS — E119 Type 2 diabetes mellitus without complications: Secondary | ICD-10-CM | POA: Diagnosis not present

## 2015-08-21 ENCOUNTER — Other Ambulatory Visit: Payer: BLUE CROSS/BLUE SHIELD

## 2015-08-21 ENCOUNTER — Other Ambulatory Visit: Payer: Self-pay

## 2015-08-21 LAB — HEMOGLOBIN A1C: HEMOGLOBIN A1C: 9 % — AB (ref 4.6–6.5)

## 2015-08-21 MED ORDER — SILDENAFIL CITRATE 100 MG PO TABS: 100.0000 mg | ORAL_TABLET | Freq: Every day | ORAL | Status: DC | PRN

## 2015-09-10 ENCOUNTER — Ambulatory Visit: Payer: BLUE CROSS/BLUE SHIELD | Admitting: Hematology & Oncology

## 2015-09-10 ENCOUNTER — Other Ambulatory Visit: Payer: BLUE CROSS/BLUE SHIELD

## 2015-09-10 ENCOUNTER — Telehealth: Payer: Self-pay | Admitting: Hematology & Oncology

## 2015-09-10 NOTE — Telephone Encounter (Signed)
Patient came in and resch 09/10/15 to 10/04/15

## 2015-09-12 ENCOUNTER — Ambulatory Visit (INDEPENDENT_AMBULATORY_CARE_PROVIDER_SITE_OTHER): Payer: BLUE CROSS/BLUE SHIELD | Admitting: Medical

## 2015-09-12 ENCOUNTER — Encounter: Payer: Self-pay | Admitting: Medical

## 2015-09-12 VITALS — BP 116/78 | HR 88 | Temp 98.1°F | Ht 69.0 in | Wt 279.4 lb

## 2015-09-12 DIAGNOSIS — E119 Type 2 diabetes mellitus without complications: Secondary | ICD-10-CM

## 2015-09-12 MED ORDER — METFORMIN HCL 500 MG PO TABS: ORAL_TABLET | ORAL | Status: DC

## 2015-09-12 NOTE — Progress Notes (Signed)
Subjective:    Patient ID: [name], male    DOB: 04-08-1961, 54 y.o.   MRN: [id number]  HPI   Pt in for follow up on a1-c.  Pt a1-c was 9.0 3 wks ago. He states since he was advised on average being about 245 he had been eating a lot of vegetables and cut back tremendously on carbs. He walks moderate amount at work.  Pt has history of using januvia in the past and when he did he had likely hypoglycemic event. Pt thinks that his monitor is one touch. He never used any other medications.     Review of Systems  Constitutional: Negative for fever, chills and fatigue.  Respiratory: Negative for cough, choking and shortness of breath.   Cardiovascular: Negative for chest pain and palpitations.  Endocrine: Negative for polydipsia, polyphagia and polyuria.  Skin: Negative for rash.  Neurological: Negative for dizziness and headaches.  Hematological: Negative for adenopathy. Does not bruise/bleed easily.    Past Medical History  Diagnosis Date  . Refusal of blood transfusions as patient is Jehovah's Witness   . Diastolic CHF ([place])     a. Acute diastolic CHF 99991111 in the setting of perimembranous VSD. Eval underway given mod pulm HTN and RV dilation.  . Borderline diabetic     a. A1C 6.6 in 07/2013 - needs to f/u PCP for likely full DM.  [name] Hemochromatosis     H63D homozyous mutation  . VSD (ventricular septal defect), perimembranous     a. Dx 07/2013 with significant L-R shunt.  . Pulmonary HTN ([place])     a. 07/2013: Moderate pulmonary HTN with RV dilitation.  [name] CAD (coronary artery disease)     a. 07/2013: mild nonobstructive CAD by cath 07/25/13 as part of eval for VSD/SOB.  . CKD (chronic kidney disease)   . RBBB   . LAFB (left anterior fascicular block)   . Hypogonadism male   . H/O: hypothyroidism   . ED (erectile dysfunction)   . Leukocytosis     secondary to testosterone replacement  . Erythrocytosis     secondary to testosterone replacement  . Diabetes mellitus  without complication ([place])   . GERD (gastroesophageal reflux disease)   . Heart murmur   . Sleep apnea     does not use CPAP sleeps on stomach  . Hyperlipidemia   . Hypertension     Social History   Social History  . Marital Status: Married    Spouse Name: N/A  . Number of Children: N/A  . Years of Education: N/A   Occupational History  .      Maintenance    Social History Main Topics  . Smoking status: Never Smoker   . Smokeless tobacco: Never Used     Comment: never used tobacco  . Alcohol Use: No  . Drug Use: No  . Sexual Activity: Not on file   Other Topics Concern  . Not on file   Social History Narrative    Past Surgical History  Procedure Laterality Date  . Tee without cardioversion N/A 07/25/2013    Procedure: TRANSESOPHAGEAL ECHOCARDIOGRAM (TEE);  Surgeon: Dorothy Spark, MD;  Location: [facility] ENDOSCOPY;  Service: Cardiovascular;  Laterality: N/A;  . Phlebotomy    . Left and right heart catheterization with coronary angiogram N/A 07/25/2013    Procedure: LEFT AND RIGHT HEART CATHETERIZATION WITH CORONARY ANGIOGRAM;  Surgeon: Jolaine Artist, MD;  Location: [facility] CATH LAB;  Service: Cardiovascular;  Laterality: N/A;  .  Cardiac surgery      Hole patched    Family History  Problem Relation Age of Onset  . Cancer Mother   . Hypertension Father   . Congestive Heart Failure Father   . Hemochromatosis      family history  . Colon cancer Paternal Aunt     No Known Allergies  Current Outpatient Prescriptions on File Prior to Visit  Medication Sig Dispense Refill  . atorvastatin (LIPITOR) 20 MG tablet TAKE 1 TABLET (20 MG) BY MOUTH DAILY. 30 tablet 3  . losartan (COZAAR) 50 MG tablet Take 1 tablet (50 mg total) by mouth daily. 90 tablet 2  . ranitidine (ZANTAC) 150 MG capsule Take 1 capsule (150 mg total) by mouth 2 (two) times daily. 60 capsule 0  . sildenafil (VIAGRA) 100 MG tablet Take 1 tablet (100 mg total) by mouth daily as needed for erectile  dysfunction. 10 tablet 0  . testosterone cypionate (DEPOTESTOSTERONE CYPIONATE) 200 MG/ML injection Inject 1 mL (200 mg total) into the muscle every 14 (fourteen) days. As needed now 10 mL 3   No current facility-administered medications on file prior to visit.    BP 116/78 mmHg  Pulse 88  Temp(Src) 98.1 F (36.7 C) (Oral)  Ht 5\' 9"  (1.753 m)  Wt 279 lb 6.4 oz (126.735 kg)  BMI 41.24 kg/m2  SpO2 98%       Objective:   Physical Exam  General Mental Status- Alert. General Appearance- Not in acute distress.   Skin General: Color- Normal Color. Moisture- Normal Moisture.   Chest and Lung Exam Auscultation: Breath Sounds:-Normal. CTA.  Cardiovascular Auscultation:Rythm- Regular, Rate and rhythm. Murmurs & Other Heart Sounds:Auscultation of the heart reveals- No Murmurs.  Abdomen Inspection:-Inspeection Normal. Palpation/Percussion:Note:No mass. Palpation and Percussion of the abdomen reveal- Non Tender, Non Distended + BS, no rebound or guarding.    Neurologic Cranial Nerve exam:- CN III-XII intact(No nystagmus), symmetric smile. Strength:- 5/5 equal and symmetric strength both upper and lower extremities.  Lower ext- see quality metrics.      Assessment & Plan:  For your diabetes with recent a1-c of 9 I will rx metformin low dose. I am doing this in order to reduce side effects but will likely need to increase mg dose and frequency. You have concern for hypoglycemia and that should not occur. If you do have some symptoms which you think are from low sugar check your blood sugar.(but again should not get low sugar from metformin).  Follow up in 9 wks for repeat a1-c.  Please call us with exact brand of your glucometer so we can call in strips.

## 2015-09-12 NOTE — Progress Notes (Signed)
Pre visit review using our clinic review tool, if applicable. No additional management support is needed unless otherwise documented below in the visit note. 

## 2015-09-12 NOTE — Patient Instructions (Addendum)
For your diabetes with recent a1-c of 9 I will rx metformin low dose. I am doing this in order to reduce side effects but will likely need to increase mg dose and frequency. You have concern for hypoglycemia and that should not occur. If you do have some symptoms which you think are from low sugar check your blood sugar.(but again should not get low sugar from metformin).  Follow up in 9 wks for repeat a1-c, cmp and urine micoalbumin. Please call us with exact brand of your glucometer so we can call in strips.

## 2015-10-04 ENCOUNTER — Encounter: Payer: Self-pay | Admitting: Hematology & Oncology

## 2015-10-04 ENCOUNTER — Ambulatory Visit: Payer: BLUE CROSS/BLUE SHIELD

## 2015-10-04 ENCOUNTER — Other Ambulatory Visit (HOSPITAL_BASED_OUTPATIENT_CLINIC_OR_DEPARTMENT_OTHER): Payer: BLUE CROSS/BLUE SHIELD

## 2015-10-04 ENCOUNTER — Ambulatory Visit (HOSPITAL_BASED_OUTPATIENT_CLINIC_OR_DEPARTMENT_OTHER): Payer: BLUE CROSS/BLUE SHIELD | Admitting: Hematology & Oncology

## 2015-10-04 DIAGNOSIS — I10 Essential (primary) hypertension: Secondary | ICD-10-CM

## 2015-10-04 DIAGNOSIS — E349 Endocrine disorder, unspecified: Secondary | ICD-10-CM

## 2015-10-04 DIAGNOSIS — D751 Secondary polycythemia: Secondary | ICD-10-CM | POA: Diagnosis not present

## 2015-10-04 DIAGNOSIS — Q210 Ventricular septal defect: Secondary | ICD-10-CM

## 2015-10-04 DIAGNOSIS — N529 Male erectile dysfunction, unspecified: Secondary | ICD-10-CM

## 2015-10-04 DIAGNOSIS — E291 Testicular hypofunction: Secondary | ICD-10-CM

## 2015-10-04 DIAGNOSIS — I5032 Chronic diastolic (congestive) heart failure: Secondary | ICD-10-CM

## 2015-10-04 LAB — CBC WITH DIFFERENTIAL (CANCER CENTER ONLY)
BASO#: 0.1 10*3/uL (ref 0.0–0.2)
BASO%: 0.5 % (ref 0.0–2.0)
EOS ABS: 0.4 10*3/uL (ref 0.0–0.5)
EOS%: 4.6 % (ref 0.0–7.0)
HEMATOCRIT: 47.2 % (ref 38.7–49.9)
HGB: 17 g/dL (ref 13.0–17.1)
LYMPH#: 1.5 10*3/uL (ref 0.9–3.3)
LYMPH%: 16.5 % (ref 14.0–48.0)
MCH: 31 pg (ref 28.0–33.4)
MCHC: 36 g/dL — ABNORMAL HIGH (ref 32.0–35.9)
MCV: 86 fL (ref 82–98)
MONO#: 0.8 10*3/uL (ref 0.1–0.9)
MONO%: 8.7 % (ref 0.0–13.0)
NEUT#: 6.5 10*3/uL (ref 1.5–6.5)
NEUT%: 69.7 % (ref 40.0–80.0)
PLATELETS: 211 10*3/uL (ref 145–400)
RBC: 5.49 10*6/uL (ref 4.20–5.70)
RDW: 13.9 % (ref 11.1–15.7)
WBC: 9.3 10*3/uL (ref 4.0–10.0)

## 2015-10-04 LAB — COMPREHENSIVE METABOLIC PANEL (CC13)
ALT: 56 U/L — ABNORMAL HIGH (ref 9–46)
AST: 30 U/L (ref 10–35)
Albumin: 4.2 g/dL (ref 3.6–5.1)
Alkaline Phosphatase: 57 U/L (ref 40–115)
BUN: 18 mg/dL (ref 7–25)
CALCIUM: 9.3 mg/dL (ref 8.6–10.3)
CHLORIDE: 98 mmol/L (ref 98–110)
CO2: 26 mmol/L (ref 20–31)
Creatinine, Ser: 1.18 mg/dL (ref 0.70–1.33)
GLUCOSE: 277 mg/dL — AB (ref 65–99)
POTASSIUM: 4.4 mmol/L (ref 3.5–5.3)
Sodium: 136 mmol/L (ref 135–146)
TOTAL PROTEIN: 7.1 g/dL (ref 6.1–8.1)
Total Bilirubin: 0.8 mg/dL (ref 0.2–1.2)

## 2015-10-04 NOTE — Progress Notes (Signed)
Phlebotomy not indicated per Dr. Marin Olp, Hct 47.2 and Hgb 17.

## 2015-10-04 NOTE — Progress Notes (Signed)
Hematology and Oncology Follow Up Visit  Kevin Wiggins [id number] 1961-05-08 54 y.o. 10/04/2015   Principle Diagnosis:  1.  Hemochromatosis (homozygous for H63D mutation). 2. Hypogonadism secondary to hemochromatosis. 3. Erythrocytosis secondary to testosterone therapy. 4. Ruptured right sinus of Valsalva aneurysm.  Current Therapy:   1. The patient status post cardiac surgery to repair the sinus of     Valsalva aneurysm. 2. Phlebotomy to keep hematocrit below 48%. 3. Testosterone 200 mg IM q.2 weeks.     Interim History:  [name] is back for followup. He looks real good. He really had a good Thanksgiving and Christmas. He and his new wife are enjoying their new life together.  He has had no health issues. He is still working. He's had no problems with fatigue or weakness. The testosterone that he takes at home is helping quite a bit.  We have not had to phlebotomize him for several months.  He's had no cardiac issues. He's had good blood pressure control.  He's trying to watch what he eats.  Overall, his performance status is ECOG 1.  Medications:  Current outpatient prescriptions:  .  atorvastatin (LIPITOR) 20 MG tablet, TAKE 1 TABLET (20 MG) BY MOUTH DAILY., Disp: 30 tablet, Rfl: 3 .  losartan (COZAAR) 50 MG tablet, Take 1 tablet (50 mg total) by mouth daily., Disp: 90 tablet, Rfl: 2 .  metFORMIN (GLUCOPHAGE) 500 MG tablet, 1 tab po q day, Disp: 30 tablet, Rfl: 2 .  ranitidine (ZANTAC) 150 MG capsule, Take 1 capsule (150 mg total) by mouth 2 (two) times daily., Disp: 60 capsule, Rfl: 0 .  sildenafil (VIAGRA) 100 MG tablet, Take 1 tablet (100 mg total) by mouth daily as needed for erectile dysfunction., Disp: 10 tablet, Rfl: 0 .  testosterone cypionate (DEPOTESTOSTERONE CYPIONATE) 200 MG/ML injection, Inject 1 mL (200 mg total) into the muscle every 14 (fourteen) days. As needed now, Disp: 10 mL, Rfl: 3  Allergies:  No Known Allergies  Past Medical History, Surgical  history, Social history, and Family History were reviewed and updated.  Review of Systems: As above  Physical Exam:  height is 5\' 9"  (1.753 m) and weight is 275 lb (124.739 kg). His oral temperature is 98 F (36.7 C). His blood pressure is 149/89 and his pulse is 97. His respiration is 16.   Well-developed and well-nourished white gentleman. Head and neck exam shows no ocular or oral lesions. He has no conjunctival inflammation. He has no palpable cervical or supraclavicular lymph nodes. Lungs are clear. Cardiac exam regular rate and rhythm with no murmurs rubs or bruits. Abdomen is soft. He is moderately obese. There is no fluid wave. There is no palpable liver or spleen tip. Back exam shows no tenderness over the spine ribs or hips. Extremities shows no clubbing cyanosis or edema. Neurological exam shows no focal neurological deficits. Skin exam no rashes, ecchymoses or petechia.   Lab Results  Component Value Date   WBC 9.3 10/04/2015   HGB 17.0 10/04/2015   HCT 47.2 10/04/2015   MCV 86 10/04/2015   PLT 211 10/04/2015     Chemistry      Component Value Date/Time   NA 140 07/26/2015 1521   NA 140 06/21/2015 1428   NA 142 12/27/2014 1401   K 4.8 07/26/2015 1521   K 4.9 06/21/2015 1428   K 4.3 12/27/2014 1401   CL 101 07/26/2015 1521   CL 100 12/27/2014 1401   CO2 29 07/26/2015 1521  CO2 29 06/21/2015 1428   CO2 28 12/27/2014 1401   BUN 16 07/26/2015 1521   BUN 19.7 06/21/2015 1428   BUN 15 12/27/2014 1401   CREATININE 1.31 07/26/2015 1521   CREATININE 1.8* 06/21/2015 1428   CREATININE 1.3* 12/27/2014 1401      Component Value Date/Time   CALCIUM 9.9 07/26/2015 1521   CALCIUM 9.9 06/21/2015 1428   CALCIUM 9.1 12/27/2014 1401   ALKPHOS 63 07/26/2015 1521   ALKPHOS 71 06/21/2015 1428   ALKPHOS 49 12/27/2014 1401   AST 25 07/26/2015 1521   AST 27 06/21/2015 1428   AST 27 12/27/2014 1401   ALT 57* 07/26/2015 1521   ALT 52 06/21/2015 1428   ALT 51* 12/27/2014 1401    BILITOT 0.6 07/26/2015 1521   BILITOT 0.74 06/21/2015 1428   BILITOT 0.80 12/27/2014 1401         Impression and Plan: Kevin Wiggins is 54 year old white male.[name] He has a homozygous mutation for one of the minor genes for hemochromatosis.    This is been a very good year for him. I'm just very happy that everything turned out well with respect to his wedding. He and his new wife are very happy.   For now, I don't think we have to get him back for 2 months. He does not need to be phlebotomized.  [name], MD 12/29/20164:42 PM

## 2015-10-05 LAB — IRON AND TIBC
%SAT: 52 % (ref 20–55)
Iron: 151 ug/dL (ref 42–163)
TIBC: 291 ug/dL (ref 202–409)
UIBC: 140 ug/dL (ref 117–376)

## 2015-10-05 LAB — TESTOSTERONE: TESTOSTERONE: 231 ng/dL — AB (ref 300–890)

## 2015-10-05 LAB — FERRITIN: Ferritin: 62 ng/ml (ref 22–316)

## 2015-10-26 ENCOUNTER — Other Ambulatory Visit: Payer: Self-pay | Admitting: Cardiology

## 2015-10-26 MED FILL — ATORVASTATIN 20 MG TABLET: 20 | 30 days supply | Qty: 30 | Fill #0

## 2015-10-26 MED FILL — LOSARTAN POTASSIUM 50 MG TA: 50 | 30 days supply | Qty: 30 | Fill #7

## 2015-11-01 MED FILL — metFORMIN HCL 500 MG TABS: 500 | 30 days supply | Qty: 30 | Fill #1

## 2015-11-14 ENCOUNTER — Ambulatory Visit (INDEPENDENT_AMBULATORY_CARE_PROVIDER_SITE_OTHER): Payer: BLUE CROSS/BLUE SHIELD | Admitting: Medical

## 2015-11-14 ENCOUNTER — Encounter: Payer: Self-pay | Admitting: Medical

## 2015-11-14 VITALS — BP 120/64 | HR 89 | Temp 97.7°F | Ht 69.0 in | Wt 273.6 lb

## 2015-11-14 DIAGNOSIS — I10 Essential (primary) hypertension: Secondary | ICD-10-CM

## 2015-11-14 DIAGNOSIS — E785 Hyperlipidemia, unspecified: Secondary | ICD-10-CM | POA: Diagnosis not present

## 2015-11-14 DIAGNOSIS — E119 Type 2 diabetes mellitus without complications: Secondary | ICD-10-CM

## 2015-11-14 MED ORDER — METFORMIN HCL 500 MG PO TABS: 500.0000 mg | ORAL_TABLET | Freq: Two times a day (BID) | ORAL | Status: DC

## 2015-11-14 MED FILL — metFORMIN HCL 500 MG TABS: 500 | 30 days supply | Qty: 60 | Fill #0

## 2015-11-14 MED FILL — TESTOSTERONE CYP 200 MG/ML: 200 | 28 days supply | Qty: 2 | Fill #3

## 2015-11-14 NOTE — Progress Notes (Signed)
Subjective:    Patient ID: Kevin Wiggins, male    DOB: Apr 10, 1961, 55 y.o.   MRN: [id number]  HPI  Pt in for follow up. Pt states when he checks his blood sugar his sugars are low 200's to 250's. He is trying to eat healthy but married and wife is a good cook. Pt has plans to join gym.    I started him on low dose metformin  to avoid any side effects. Prior use of other medications caused hypoglycemia. So he was cautious to start any meds.  Pt blood pressure is well controlled. No  cardiac or neurologic signs or symptoms.  Pt lipids 5 month ago was well controlled.   Pt did have diabetic eye exam in January.     Review of Systems  Constitutional: Negative for fever, chills and fatigue.  HENT: Negative for congestion, drooling, ear pain, nosebleeds, postnasal drip, sinus pressure, sneezing and sore throat.   Respiratory: Negative for cough, shortness of breath and wheezing.   Cardiovascular: Negative for chest pain and palpitations.  Gastrointestinal: Negative for abdominal pain.  Musculoskeletal: Negative for back pain.  Skin: Negative for pallor.  Neurological: Negative for dizziness, seizures and headaches.  Hematological: Negative for adenopathy. Does not bruise/bleed easily.  Psychiatric/Behavioral: Negative for behavioral problems and decreased concentration.    Past Medical History  Diagnosis Date  . Refusal of blood transfusions as patient is Jehovah's Witness   . Diastolic CHF ([place])     a. Acute diastolic CHF 99991111 in the setting of perimembranous VSD. Eval underway given mod pulm HTN and RV dilation.  . Borderline diabetic     a. A1C 6.6 in 07/2013 - needs to f/u PCP for likely full DM.  [name] Hemochromatosis     H63D homozyous mutation  . VSD (ventricular septal defect), perimembranous     a. Dx 07/2013 with significant L-R shunt.  . Pulmonary HTN ([place])     a. 07/2013: Moderate pulmonary HTN with RV dilitation.  [name] CAD (coronary artery disease)     a. 07/2013:  mild nonobstructive CAD by cath 07/25/13 as part of eval for VSD/SOB.  . CKD (chronic kidney disease)   . RBBB   . LAFB (left anterior fascicular block)   . Hypogonadism male   . H/O: hypothyroidism   . ED (erectile dysfunction)   . Leukocytosis     secondary to testosterone replacement  . Erythrocytosis     secondary to testosterone replacement  . Diabetes mellitus without complication ([place])   . GERD (gastroesophageal reflux disease)   . Heart murmur   . Sleep apnea     does not use CPAP sleeps on stomach  . Hyperlipidemia   . Hypertension     Social History   Social History  . Marital Status: Married    Spouse Name: N/A  . Number of Children: N/A  . Years of Education: N/A   Occupational History  .      Maintenance    Social History Main Topics  . Smoking status: Never Smoker   . Smokeless tobacco: Never Used     Comment: never used tobacco  . Alcohol Use: No  . Drug Use: No  . Sexual Activity: Not on file   Other Topics Concern  . Not on file   Social History Narrative    Past Surgical History  Procedure Laterality Date  . Tee without cardioversion N/A 07/25/2013    Procedure: TRANSESOPHAGEAL ECHOCARDIOGRAM (TEE);  Surgeon: Dorothy Spark,  MD;  Location: [facility] ENDOSCOPY;  Service: Cardiovascular;  Laterality: N/A;  . Phlebotomy    . Left and right heart catheterization with coronary angiogram N/A 07/25/2013    Procedure: LEFT AND RIGHT HEART CATHETERIZATION WITH CORONARY ANGIOGRAM;  Surgeon: Jolaine Artist, MD;  Location: [facility] CATH LAB;  Service: Cardiovascular;  Laterality: N/A;  . Cardiac surgery      Hole patched    Family History  Problem Relation Age of Onset  . Cancer Mother   . Hypertension Father   . Congestive Heart Failure Father   . Hemochromatosis      family history  . Colon cancer Paternal Aunt     No Known Allergies  Current Outpatient Prescriptions on File Prior to Visit  Medication Sig Dispense Refill  . atorvastatin  (LIPITOR) 20 MG tablet TAKE 1 TABLET (20 MG) BY MOUTH DAILY. 30 tablet 2  . losartan (COZAAR) 50 MG tablet Take 1 tablet (50 mg total) by mouth daily. 90 tablet 2  . metFORMIN (GLUCOPHAGE) 500 MG tablet 1 tab po q day 30 tablet 2  . ranitidine (ZANTAC) 150 MG capsule Take 1 capsule (150 mg total) by mouth 2 (two) times daily. 60 capsule 0  . sildenafil (VIAGRA) 100 MG tablet Take 1 tablet (100 mg total) by mouth daily as needed for erectile dysfunction. 10 tablet 0  . testosterone cypionate (DEPOTESTOSTERONE CYPIONATE) 200 MG/ML injection Inject 1 mL (200 mg total) into the muscle every 14 (fourteen) days. As needed now 10 mL 3   No current facility-administered medications on file prior to visit.    BP 120/64 mmHg  Pulse 89  Temp(Src) 97.7 F (36.5 C) (Oral)  Ht 5\' 9"  (1.753 m)  Wt 273 lb 9.6 oz (124.104 kg)  BMI 40.39 kg/m2  SpO2 96%        Objective:   Physical Exam  General Mental Status- Alert. General Appearance- Not in acute distress.   Skin General: Color- Normal Color. Moisture- Normal Moisture.  Neck Carotid Arteries- Normal color. Moisture- Normal Moisture. No carotid bruits. No JVD.  Chest and Lung Exam Auscultation: Breath Sounds:-Normal.  Cardiovascular Auscultation:Rythm- Regular. Murmurs & Other Heart Sounds:Auscultation of the heart reveals- No Murmurs.  Abdomen Inspection:-Inspeection Normal. Palpation/Percussion:Note:No mass. Palpation and Percussion of the abdomen reveal- Non Tender, Non Distended + BS, no rebound or guarding.    Neurologic Cranial Nerve exam:- CN III-XII intact(No nystagmus), symmetric smile. Strength:- 5/5 equal and symmetric strength both upper and lower extremities.      Assessment & Plan:  Will increase metformin to 500 mg bid. Diet and exercise. Will get labs next week past feb 14th. (fasting labs).  Continue current meds for blood pressure and cholesterol medications. Will check cmp and lipids next week.   Follow  up in mid May 2017 or as needed.  Pt wants [place] to call in strips for machine. Asked him to call and give [place] machine brand info and will call in strips.

## 2015-11-14 NOTE — Progress Notes (Signed)
Pre visit review using our clinic review tool, if applicable. No additional management support is needed unless otherwise documented below in the visit note. 

## 2015-11-14 NOTE — Patient Instructions (Addendum)
Will increase metformin to 500 mg bid. Diet and exercise. Will get labs next week past feb 14th. (fasting labs).  Continue current meds for blood pressure and cholesterol medications. Will check cmp and lipids next week.   Follow up in mid May 2017 or as needed.

## 2015-11-22 ENCOUNTER — Other Ambulatory Visit (INDEPENDENT_AMBULATORY_CARE_PROVIDER_SITE_OTHER): Payer: BLUE CROSS/BLUE SHIELD

## 2015-11-22 DIAGNOSIS — E785 Hyperlipidemia, unspecified: Secondary | ICD-10-CM

## 2015-11-22 DIAGNOSIS — E119 Type 2 diabetes mellitus without complications: Secondary | ICD-10-CM | POA: Diagnosis not present

## 2015-11-22 LAB — COMPREHENSIVE METABOLIC PANEL
ALBUMIN: 4.1 g/dL (ref 3.5–5.2)
ALK PHOS: 58 U/L (ref 39–117)
ALT: 54 U/L — ABNORMAL HIGH (ref 0–53)
AST: 27 U/L (ref 0–37)
BILIRUBIN TOTAL: 0.8 mg/dL (ref 0.2–1.2)
BUN: 14 mg/dL (ref 6–23)
CALCIUM: 9.5 mg/dL (ref 8.4–10.5)
CHLORIDE: 99 meq/L (ref 96–112)
CO2: 28 meq/L (ref 19–32)
Creatinine, Ser: 1.15 mg/dL (ref 0.40–1.50)
GFR: 70.12 mL/min (ref >60.00)
Glucose, Bld: 235 mg/dL — ABNORMAL HIGH (ref 70–99)
POTASSIUM: 4 meq/L (ref 3.5–5.1)
Sodium: 136 mEq/L (ref 135–145)
Total Protein: 7.1 g/dL (ref 6.0–8.3)

## 2015-11-22 LAB — HEMOGLOBIN A1C: Hgb A1c MFr Bld: 11.2 % — ABNORMAL HIGH (ref 4.6–6.5)

## 2015-11-22 LAB — LIPID PANEL
CHOLESTEROL: 125 mg/dL (ref 0–200)
HDL: 44.3 mg/dL (ref >39.00)
LDL Cholesterol: 56 mg/dL (ref 0–99)
NONHDL: 80.74
Total CHOL/HDL Ratio: 3
Triglycerides: 122 mg/dL (ref 0.0–149.0)
VLDL: 24.4 mg/dL (ref 0.0–40.0)

## 2015-11-29 MED FILL — ATORVASTATIN 20 MG TABLET: 20 | 30 days supply | Qty: 30 | Fill #1

## 2015-11-29 MED FILL — LOSARTAN POTASSIUM 50 MG TA: 50 | 30 days supply | Qty: 30 | Fill #8

## 2015-12-05 ENCOUNTER — Ambulatory Visit: Payer: BLUE CROSS/BLUE SHIELD | Admitting: Hematology & Oncology

## 2015-12-05 ENCOUNTER — Other Ambulatory Visit: Payer: BLUE CROSS/BLUE SHIELD

## 2015-12-24 ENCOUNTER — Encounter: Payer: Self-pay | Admitting: Hematology & Oncology

## 2015-12-24 ENCOUNTER — Ambulatory Visit (HOSPITAL_BASED_OUTPATIENT_CLINIC_OR_DEPARTMENT_OTHER): Payer: BLUE CROSS/BLUE SHIELD

## 2015-12-24 ENCOUNTER — Ambulatory Visit (HOSPITAL_BASED_OUTPATIENT_CLINIC_OR_DEPARTMENT_OTHER): Payer: BLUE CROSS/BLUE SHIELD | Admitting: Hematology & Oncology

## 2015-12-24 ENCOUNTER — Other Ambulatory Visit (HOSPITAL_BASED_OUTPATIENT_CLINIC_OR_DEPARTMENT_OTHER): Payer: BLUE CROSS/BLUE SHIELD

## 2015-12-24 DIAGNOSIS — E291 Testicular hypofunction: Secondary | ICD-10-CM

## 2015-12-24 DIAGNOSIS — E1122 Type 2 diabetes mellitus with diabetic chronic kidney disease: Secondary | ICD-10-CM

## 2015-12-24 DIAGNOSIS — I10 Essential (primary) hypertension: Secondary | ICD-10-CM

## 2015-12-24 DIAGNOSIS — E349 Endocrine disorder, unspecified: Secondary | ICD-10-CM

## 2015-12-24 DIAGNOSIS — N181 Chronic kidney disease, stage 1: Secondary | ICD-10-CM

## 2015-12-24 DIAGNOSIS — D751 Secondary polycythemia: Secondary | ICD-10-CM

## 2015-12-24 LAB — CBC WITH DIFFERENTIAL (CANCER CENTER ONLY)
BASO#: 0 10*3/uL (ref 0.0–0.2)
BASO%: 0.3 % (ref 0.0–2.0)
EOS ABS: 0.4 10*3/uL (ref 0.0–0.5)
EOS%: 4.4 % (ref 0.0–7.0)
HCT: 52.5 % — ABNORMAL HIGH (ref 38.7–49.9)
HEMOGLOBIN: 18.1 g/dL — AB (ref 13.0–17.1)
LYMPH#: 1.7 10*3/uL (ref 0.9–3.3)
LYMPH%: 19 % (ref 14.0–48.0)
MCH: 31.9 pg (ref 28.0–33.4)
MCHC: 34.5 g/dL (ref 32.0–35.9)
MCV: 92 fL (ref 82–98)
MONO#: 1 10*3/uL — AB (ref 0.1–0.9)
MONO%: 11 % (ref 0.0–13.0)
NEUT%: 65.3 % (ref 40.0–80.0)
NEUTROS ABS: 5.8 10*3/uL (ref 1.5–6.5)
PLATELETS: 212 10*3/uL (ref 145–400)
RBC: 5.68 10*6/uL (ref 4.20–5.70)
RDW: 14 % (ref 11.1–15.7)
WBC: 8.9 10*3/uL (ref 4.0–10.0)

## 2015-12-24 LAB — CMP (CANCER CENTER ONLY)
ALBUMIN: 3.7 g/dL (ref 3.3–5.5)
ALK PHOS: 53 U/L (ref 26–84)
ALT: 70 U/L — AB (ref 10–47)
AST: 41 U/L — ABNORMAL HIGH (ref 11–38)
BILIRUBIN TOTAL: 1.1 mg/dL (ref 0.20–1.60)
BUN: 14 mg/dL (ref 7–22)
CO2: 31 mEq/L (ref 18–33)
CREATININE: 1.3 mg/dL — AB (ref 0.6–1.2)
Calcium: 9.5 mg/dL (ref 8.0–10.3)
Chloride: 98 mEq/L (ref 98–108)
Glucose, Bld: 238 mg/dL — ABNORMAL HIGH (ref 73–118)
Potassium: 4.6 mEq/L (ref 3.3–4.7)
SODIUM: 139 meq/L (ref 128–145)
TOTAL PROTEIN: 7.2 g/dL (ref 6.4–8.1)

## 2015-12-24 NOTE — Patient Instructions (Signed)

## 2015-12-24 NOTE — Progress Notes (Signed)
Hematology and Oncology Follow Up Visit  [address] [id number] 1961-05-14 55 y.o. 12/24/2015   Principle Diagnosis:  1.  Hemochromatosis (homozygous for H63D mutation). 2. Hypogonadism secondary to hemochromatosis. 3. Erythrocytosis secondary to testosterone therapy. 4. Ruptured right sinus of Valsalva aneurysm.  Current Therapy:   1. The patient status post cardiac surgery to repair the sinus of     Valsalva aneurysm. 2. Phlebotomy to keep hematocrit below 48%. 3. Testosterone 200 mg IM q.2 weeks.     Interim History:  Kevin Wiggins is back for followup. He looks real good. He is working out more. He seems to be quite excited about this. He is incredibly dedicated to exercise and trying to lose weight.  He is on metformin twice a day now. Hopefully, this will help his blood sugars.  He is still enjoying married life.  He is still working.  He's had no cough. He's had no cardiac issues.  He still takes his testosterone. I'm sure this is why his hemoglobin has been on the higher side.   He does have a little bit of a rash on his ankles and on the inner aspect of his left leg. He's been applying some over-the-counter cream.  Overall, his performance status is ECOG 0.  Medications:  Current outpatient prescriptions:  .  atorvastatin (LIPITOR) 20 MG tablet, TAKE 1 TABLET (20 MG) BY MOUTH DAILY., Disp: 30 tablet, Rfl: 2 .  losartan (COZAAR) 50 MG tablet, Take 1 tablet (50 mg total) by mouth daily., Disp: 90 tablet, Rfl: 2 .  metFORMIN (GLUCOPHAGE) 500 MG tablet, Take 1 tablet (500 mg total) by mouth 2 (two) times daily with a meal., Disp: 60 tablet, Rfl: 3 .  ranitidine (ZANTAC) 150 MG capsule, Take 1 capsule (150 mg total) by mouth 2 (two) times daily., Disp: 60 capsule, Rfl: 0 .  sildenafil (VIAGRA) 100 MG tablet, Take 1 tablet (100 mg total) by mouth daily as needed for erectile dysfunction., Disp: 10 tablet, Rfl: 0 .  testosterone cypionate (DEPOTESTOSTERONE CYPIONATE) 200 MG/ML  injection, Inject 1 mL (200 mg total) into the muscle every 14 (fourteen) days. As needed now, Disp: 10 mL, Rfl: 3  Allergies:  No Known Allergies  Past Medical History, Surgical history, Social history, and Family History were reviewed and updated.  Review of Systems: As above  Physical Exam:  height is 5\' 9"  (1.753 m) and weight is 273 lb (123.832 kg). His oral temperature is 98.1 F (36.7 C). His blood pressure is 155/79 and his pulse is 91. His respiration is 18.   Well-developed and well-nourished white gentleman. Head and neck exam shows no ocular or oral lesions. He has no conjunctival inflammation. He has no palpable cervical or supraclavicular lymph nodes. Lungs are clear. Cardiac exam regular rate and rhythm with no murmurs rubs or bruits. Abdomen is soft. He is moderately obese. There is no fluid wave. There is no palpable liver or spleen tip. Back exam shows no tenderness over the spine ribs or hips. Extremities shows no clubbing cyanosis or edema. Neurological exam shows no focal neurological deficits. Skin exam a macular type rash on his ankles. He also has areas of macular rash with some papular areas on his inner thigh. No blisters are noted. .   Lab Results  Component Value Date   WBC 8.9 12/24/2015   HGB 18.1* 12/24/2015   HCT 52.5* 12/24/2015   MCV 92 12/24/2015   PLT 212 12/24/2015     Chemistry  Component Value Date/Time   NA 139 12/24/2015 1343   NA 136 11/22/2015 0707   NA 140 06/21/2015 1428   K 4.6 12/24/2015 1343   K 4.0 11/22/2015 0707   K 4.9 06/21/2015 1428   CL 98 12/24/2015 1343   CL 99 11/22/2015 0707   CO2 31 12/24/2015 1343   CO2 28 11/22/2015 0707   CO2 29 06/21/2015 1428   BUN 14 12/24/2015 1343   BUN 14 11/22/2015 0707   BUN 19.7 06/21/2015 1428   CREATININE 1.3* 12/24/2015 1343   CREATININE 1.15 11/22/2015 0707   CREATININE 1.8* 06/21/2015 1428      Component Value Date/Time   CALCIUM 9.5 12/24/2015 1343   CALCIUM 9.5 11/22/2015  0707   CALCIUM 9.9 06/21/2015 1428   ALKPHOS 53 12/24/2015 1343   ALKPHOS 58 11/22/2015 0707   ALKPHOS 71 06/21/2015 1428   AST 41* 12/24/2015 1343   AST 27 11/22/2015 0707   AST 27 06/21/2015 1428   ALT 70* 12/24/2015 1343   ALT 54* 11/22/2015 0707   ALT 52 06/21/2015 1428   BILITOT 1.10 12/24/2015 1343   BILITOT 0.8 11/22/2015 0707   BILITOT 0.74 06/21/2015 1428         Impression and Plan: Kevin Wiggins is 55 year old white male.[name] He has a homozygous mutation for one of the minor genes for hemochromatosis.     we will go ahead and phlebotomize him today. His hemoglobin and hematocrit are up quite a bit.  I don't think this rash is shingles. I was a little worried about this. However, I don't think it has the appearance of shingles   I'll plan to get him back in 6 weeks. I think we have to get him back in 6 weeks so that we can sent out with his hemoglobin.  Hopefully, his blood sugars will be better [facility] see him back. He is on metformin. [name]  [name], MD 3/20/20173:26 PM

## 2015-12-25 LAB — FERRITIN: Ferritin: 33 ng/ml (ref 22–316)

## 2015-12-25 LAB — IRON AND TIBC
%SAT: 40 % (ref 20–55)
Iron: 131 ug/dL (ref 42–163)
TIBC: 325 ug/dL (ref 202–409)
UIBC: 194 ug/dL (ref 117–376)

## 2015-12-25 LAB — TESTOSTERONE: TESTOSTERONE: 319 ng/dL — AB (ref 348–1197)

## 2015-12-31 ENCOUNTER — Other Ambulatory Visit: Payer: Self-pay | Admitting: Cardiology

## 2015-12-31 MED FILL — metFORMIN HCL 500 MG TABS: 500 | 30 days supply | Qty: 60 | Fill #1

## 2015-12-31 MED FILL — VIAGRA 100 MG TABLET: 100 | 7 days supply | Qty: 2 | Fill #1

## 2015-12-31 MED FILL — TESTOSTERONE CYP 200 MG/ML: 200 | 28 days supply | Qty: 2 | Fill #4

## 2015-12-31 MED FILL — ATORVASTATIN 20 MG TABLET: 20 | 30 days supply | Qty: 30 | Fill #2

## 2015-12-31 MED FILL — LOSARTAN POTASSIUM 50 MG TA: 50 | 30 days supply | Qty: 30 | Fill #0

## 2016-02-01 ENCOUNTER — Other Ambulatory Visit: Payer: Self-pay | Admitting: Cardiology

## 2016-02-01 MED FILL — ATORVASTATIN 20 MG TABLET: 20 | 30 days supply | Qty: 30 | Fill #0

## 2016-02-01 MED FILL — LOSARTAN POTASSIUM 50 MG TA: 50 | 30 days supply | Qty: 30 | Fill #1

## 2016-02-07 ENCOUNTER — Ambulatory Visit (HOSPITAL_BASED_OUTPATIENT_CLINIC_OR_DEPARTMENT_OTHER): Payer: BLUE CROSS/BLUE SHIELD | Admitting: Hematology & Oncology

## 2016-02-07 ENCOUNTER — Encounter: Payer: Self-pay | Admitting: Hematology & Oncology

## 2016-02-07 ENCOUNTER — Other Ambulatory Visit: Payer: Self-pay | Admitting: *Deleted

## 2016-02-07 ENCOUNTER — Other Ambulatory Visit (HOSPITAL_BASED_OUTPATIENT_CLINIC_OR_DEPARTMENT_OTHER): Payer: BLUE CROSS/BLUE SHIELD

## 2016-02-07 ENCOUNTER — Ambulatory Visit (HOSPITAL_BASED_OUTPATIENT_CLINIC_OR_DEPARTMENT_OTHER): Payer: BLUE CROSS/BLUE SHIELD

## 2016-02-07 DIAGNOSIS — E291 Testicular hypofunction: Secondary | ICD-10-CM

## 2016-02-07 DIAGNOSIS — N181 Chronic kidney disease, stage 1: Secondary | ICD-10-CM

## 2016-02-07 DIAGNOSIS — E1122 Type 2 diabetes mellitus with diabetic chronic kidney disease: Secondary | ICD-10-CM

## 2016-02-07 DIAGNOSIS — D751 Secondary polycythemia: Secondary | ICD-10-CM

## 2016-02-07 LAB — CBC WITH DIFFERENTIAL (CANCER CENTER ONLY)
BASO#: 0 10*3/uL (ref 0.0–0.2)
BASO%: 0.2 % (ref 0.0–2.0)
EOS%: 4.6 % (ref 0.0–7.0)
Eosinophils Absolute: 0.5 10*3/uL (ref 0.0–0.5)
HEMATOCRIT: 49.7 % (ref 38.7–49.9)
HEMOGLOBIN: 17.5 g/dL — AB (ref 13.0–17.1)
LYMPH#: 1.7 10*3/uL (ref 0.9–3.3)
LYMPH%: 16.6 % (ref 14.0–48.0)
MCH: 32.2 pg (ref 28.0–33.4)
MCHC: 35.2 g/dL (ref 32.0–35.9)
MCV: 91 fL (ref 82–98)
MONO#: 1 10*3/uL — AB (ref 0.1–0.9)
MONO%: 9.1 % (ref 0.0–13.0)
NEUT%: 69.5 % (ref 40.0–80.0)
NEUTROS ABS: 7.3 10*3/uL — AB (ref 1.5–6.5)
Platelets: 225 10*3/uL (ref 145–400)
RBC: 5.44 10*6/uL (ref 4.20–5.70)
RDW: 13.6 % (ref 11.1–15.7)
WBC: 10.5 10*3/uL — ABNORMAL HIGH (ref 4.0–10.0)

## 2016-02-07 LAB — COMPREHENSIVE METABOLIC PANEL (CC13)
ALBUMIN: 4.3 g/dL (ref 3.5–5.5)
ALT: 78 IU/L — ABNORMAL HIGH (ref 0–44)
AST: 42 IU/L — AB (ref 0–40)
Albumin/Globulin Ratio: 1.4 (ref 1.2–2.2)
Alkaline Phosphatase, S: 61 IU/L (ref 39–117)
BUN / CREAT RATIO: 7 — AB (ref 9–20)
BUN: 9 mg/dL (ref 6–24)
Bilirubin Total: 0.5 mg/dL (ref 0.0–1.2)
CALCIUM: 9.6 mg/dL (ref 8.7–10.2)
CO2: 28 mmol/L (ref 18–29)
CREATININE: 1.32 mg/dL — AB (ref 0.76–1.27)
Chloride, Ser: 99 mmol/L (ref 96–106)
GFR, EST AFRICAN AMERICAN: 70 mL/min/{1.73_m2} (ref >59)
GFR, EST NON AFRICAN AMERICAN: 60 mL/min/{1.73_m2} (ref >59)
GLOBULIN, TOTAL: 3 g/dL (ref 1.5–4.5)
Glucose: 130 mg/dL — ABNORMAL HIGH (ref 65–99)
Potassium, Ser: 4.6 mmol/L (ref 3.5–5.2)
SODIUM: 136 mmol/L (ref 134–144)
TOTAL PROTEIN: 7.3 g/dL (ref 6.0–8.5)

## 2016-02-07 MED ORDER — METHYLPREDNISOLONE 4 MG PO TBPK: ORAL_TABLET | ORAL | Status: DC

## 2016-02-07 MED FILL — METHYLPREDNISOLONE 4 MG TAB: 4 | 6 days supply | Qty: 21 | Fill #0

## 2016-02-07 NOTE — Progress Notes (Signed)
Kevin Wiggins presents today for phlebotomy per MD orders. Phlebotomy procedure started at 1600 and ended at 1610. 500 grams removed. Patient observed for 30 minutes after procedure without any incident. Patient tolerated procedure well. IV needle removed intact.

## 2016-02-07 NOTE — Patient Instructions (Signed)

## 2016-02-07 NOTE — Progress Notes (Signed)
Hematology and Oncology Follow Up Visit  Kevin Wiggins [id number] 03-12-1961 55 y.o. 02/07/2016   Principle Diagnosis:  1.  Hemochromatosis (homozygous for H63D mutation). 2. Hypogonadism secondary to hemochromatosis. 3. Erythrocytosis secondary to testosterone therapy. 4. Ruptured right sinus of Valsalva aneurysm.  Current Therapy:   1. The patient status post cardiac surgery to repair the sinus of     Valsalva aneurysm. 2. Phlebotomy to keep hematocrit below 48%. 3. Testosterone 200 mg IM q.2 weeks.     Interim History:  Kevin Wiggins is back for followup. He looks real good. He is working out more. He seems to be quite excited about this. He is incredibly dedicated to exercise and trying to lose weight.  He is on metformin twice a day now. Hopefully, this will help his blood sugars.  He is still enjoying married life.  He is still working.  He's had no cough. He's had no cardiac issues.  He still takes his testosterone. I'm sure this is why his hemoglobin has been on the higher side.   He does have a little bit of a rash on his ankles and on the inner aspect of his left leg. He's been applying some over-the-counter cream.  Overall, his performance status is ECOG 0.  Medications:  Current outpatient prescriptions:  .  atorvastatin (LIPITOR) 20 MG tablet, Take 1 tablet (20 mg total) by mouth daily. Please call and schedule a one year follow up appointment, Disp: 30 tablet, Rfl: 0 .  losartan (COZAAR) 50 MG tablet, TAKE 1 TABLET (50 MG TOTAL) BY MOUTH DAILY., Disp: 90 tablet, Rfl: 0 .  metFORMIN (GLUCOPHAGE) 500 MG tablet, Take 1 tablet (500 mg total) by mouth 2 (two) times daily with a meal., Disp: 60 tablet, Rfl: 3 .  ranitidine (ZANTAC) 150 MG capsule, Take 1 capsule (150 mg total) by mouth 2 (two) times daily., Disp: 60 capsule, Rfl: 0 .  sildenafil (VIAGRA) 100 MG tablet, Take 1 tablet (100 mg total) by mouth daily as needed for erectile dysfunction., Disp: 10 tablet, Rfl: 0 .   testosterone cypionate (DEPOTESTOSTERONE CYPIONATE) 200 MG/ML injection, Inject 1 mL (200 mg total) into the muscle every 14 (fourteen) days. As needed now, Disp: 10 mL, Rfl: 3 .  methylPREDNISolone (MEDROL DOSEPAK) 4 MG TBPK tablet, Take as directed, Disp: 21 tablet, Rfl: 0  Allergies:  No Known Allergies  Past Medical History, Surgical history, Social history, and Family History were reviewed and updated.  Review of Systems: As above  Physical Exam:  height is 5\' 9"  (1.753 m) and weight is 279 lb (126.554 kg). His oral temperature is 98.4 F (36.9 C). His blood pressure is 142/87 and his pulse is 92. His respiration is 16.   Well-developed and well-nourished white gentleman. Head and neck exam shows no ocular or oral lesions. He has no conjunctival inflammation. He has no palpable cervical or supraclavicular lymph nodes. Lungs are clear. Cardiac exam regular rate and rhythm with no murmurs rubs or bruits. Abdomen is soft. He is moderately obese. There is no fluid wave. There is no palpable liver or spleen tip. Back exam shows no tenderness over the spine ribs or hips. Extremities shows no clubbing cyanosis or edema. Neurological exam shows no focal neurological deficits. Skin exam a macular type rash on his ankles. He also has areas of macular rash with some papular areas on his inner thigh. No blisters are noted. .   Lab Results  Component Value Date   WBC 10.5*  02/07/2016   HGB 17.5* 02/07/2016   HCT 49.7 02/07/2016   MCV 91 02/07/2016   PLT 225 02/07/2016     Chemistry      Component Value Date/Time   NA 139 12/24/2015 1343   NA 136 11/22/2015 0707   NA 140 06/21/2015 1428   K 4.6 12/24/2015 1343   K 4.0 11/22/2015 0707   K 4.9 06/21/2015 1428   CL 98 12/24/2015 1343   CL 99 11/22/2015 0707   CO2 31 12/24/2015 1343   CO2 28 11/22/2015 0707   CO2 29 06/21/2015 1428   BUN 14 12/24/2015 1343   BUN 14 11/22/2015 0707   BUN 19.7 06/21/2015 1428   CREATININE 1.3* 12/24/2015  1343   CREATININE 1.15 11/22/2015 0707   CREATININE 1.8* 06/21/2015 1428      Component Value Date/Time   CALCIUM 9.5 12/24/2015 1343   CALCIUM 9.5 11/22/2015 0707   CALCIUM 9.9 06/21/2015 1428   ALKPHOS 53 12/24/2015 1343   ALKPHOS 58 11/22/2015 0707   ALKPHOS 71 06/21/2015 1428   AST 41* 12/24/2015 1343   AST 27 11/22/2015 0707   AST 27 06/21/2015 1428   ALT 70* 12/24/2015 1343   ALT 54* 11/22/2015 0707   ALT 52 06/21/2015 1428   BILITOT 1.10 12/24/2015 1343   BILITOT 0.8 11/22/2015 0707   BILITOT 0.74 06/21/2015 1428         Impression and Plan: Kevin Wiggins is 55 year old white male.[name] He has a homozygous mutation for one of the minor genes for hemochromatosis.    We will go ahead and phlebotomize him today. His hemoglobin and hematocrit are up quite a bit.  I don't think this rash is shingles. It almost looks like poison ivy. I will go ahead and try Medrol Dosepak.   I'll plan to get him back in 6 weeks. [name], MD 5/4/20174:57 PM

## 2016-02-08 LAB — IRON AND TIBC
%SAT: 30 % (ref 20–55)
IRON: 93 ug/dL (ref 42–163)
TIBC: 314 ug/dL (ref 202–409)
UIBC: 221 ug/dL (ref 117–376)

## 2016-02-08 LAB — TESTOSTERONE: TESTOSTERONE: 907 ng/dL (ref 348–1197)

## 2016-02-08 LAB — FERRITIN: FERRITIN: 30 ng/mL (ref 22–316)

## 2016-02-13 ENCOUNTER — Ambulatory Visit (INDEPENDENT_AMBULATORY_CARE_PROVIDER_SITE_OTHER): Payer: BLUE CROSS/BLUE SHIELD | Admitting: Medical

## 2016-02-13 ENCOUNTER — Encounter: Payer: Self-pay | Admitting: Medical

## 2016-02-13 ENCOUNTER — Telehealth: Payer: Self-pay | Admitting: Medical

## 2016-02-13 VITALS — BP 124/86 | HR 88 | Temp 98.1°F | Ht 69.0 in | Wt 274.8 lb

## 2016-02-13 DIAGNOSIS — E119 Type 2 diabetes mellitus without complications: Secondary | ICD-10-CM | POA: Diagnosis not present

## 2016-02-13 DIAGNOSIS — I10 Essential (primary) hypertension: Secondary | ICD-10-CM

## 2016-02-13 DIAGNOSIS — E785 Hyperlipidemia, unspecified: Secondary | ICD-10-CM

## 2016-02-13 MED FILL — metFORMIN HCL 500 MG TABS: 500 | 30 days supply | Qty: 60 | Fill #2

## 2016-02-13 NOTE — Telephone Encounter (Signed)
Updated 02/11/16 per provider request.

## 2016-02-13 NOTE — Patient Instructions (Addendum)
For your diabetes stress strict diet and exercise. If you have time recommend maybe walking 30 minutes a day. Continue metformin. If a1-c is increased may increase metformin to 2 tab  twice a day. Recent prednisone could effect/increase a1-c. Cut back on food wife cooks.  For your hx of high cholesterol will recheck lipid panel today.  For htn continue losartan.  Labs are put in as future. Get done past 16 th.  Follow up date 3 months or as needed

## 2016-02-13 NOTE — Progress Notes (Signed)
Pre visit review using our clinic review tool, if applicable. No additional management support is needed unless otherwise documented below in the visit note. 

## 2016-02-13 NOTE — Telephone Encounter (Signed)
Pt states seen eye MD for diabetic eye exam. Will you abstract.

## 2016-02-13 NOTE — Progress Notes (Signed)
Subjective:    Patient ID: Kevin Wiggins, male    DOB: Feb 04, 1961, 55 y.o.   MRN: [id number]  HPI   Pt in for follow up.   He updates me that he had rash on his calfs that itched for about a month. Dr. Marin Olp rx'd prednisone. He took taper dose for 6 days. Last one on Monday. Rash much better and no longer itches.  Pt is diabetic. His last a1-c was in February. Was high last itme.  Pt was working out but when got rash he stopped. But will start back working. Pt wife cooks good and he sometimes admits to not eating healthy. He is compliant on medication and reports regular bm but no diarrhea.   Pt bp good today. Compliant on losartan. No cardiac or neurologic signs or symptoms.  Pt tells me he has seen diabetic eye exam in last year.     Review of Systems  Constitutional: Negative for fever, chills and fatigue.  HENT: Negative for congestion, dental problem, ear pain, facial swelling, postnasal drip and rhinorrhea.   Respiratory: Negative for cough, chest tightness, shortness of breath and wheezing.   Cardiovascular: Negative for chest pain and palpitations.  Gastrointestinal: Negative for abdominal pain and blood in stool.  Musculoskeletal: Negative for back pain and gait problem.  Skin: Positive for rash.       Resolved now.  Neurological: Negative for dizziness and headaches.  Hematological: Negative for adenopathy. Does not bruise/bleed easily.  Psychiatric/Behavioral: Negative for behavioral problems and confusion.    Past Medical History  Diagnosis Date  . Refusal of blood transfusions as patient is Jehovah's Witness   . Diastolic CHF ([place])     a. Acute diastolic CHF 99991111 in the setting of perimembranous VSD. Eval underway given mod pulm HTN and RV dilation.  . Borderline diabetic     a. A1C 6.6 in 07/2013 - needs to f/u PCP for likely full DM.  [name] Hemochromatosis     H63D homozyous mutation  . VSD (ventricular septal defect), perimembranous     a. Dx 07/2013 with  significant L-R shunt.  . Pulmonary HTN ([place])     a. 07/2013: Moderate pulmonary HTN with RV dilitation.  [name] CAD (coronary artery disease)     a. 07/2013: mild nonobstructive CAD by cath 07/25/13 as part of eval for VSD/SOB.  . CKD (chronic kidney disease)   . RBBB   . LAFB (left anterior fascicular block)   . Hypogonadism male   . H/O: hypothyroidism   . ED (erectile dysfunction)   . Leukocytosis     secondary to testosterone replacement  . Erythrocytosis     secondary to testosterone replacement  . Diabetes mellitus without complication ([place])   . GERD (gastroesophageal reflux disease)   . Heart murmur   . Sleep apnea     does not use CPAP sleeps on stomach  . Hyperlipidemia   . Hypertension      Social History   Social History  . Marital Status: Married    Spouse Name: N/A  . Number of Children: N/A  . Years of Education: N/A   Occupational History  .      Maintenance    Social History Main Topics  . Smoking status: Never Smoker   . Smokeless tobacco: Never Used     Comment: never used tobacco  . Alcohol Use: No  . Drug Use: No  . Sexual Activity: Not on file   Other Topics Concern  .  Not on file   Social History Narrative    Past Surgical History  Procedure Laterality Date  . Tee without cardioversion N/A 07/25/2013    Procedure: TRANSESOPHAGEAL ECHOCARDIOGRAM (TEE);  Surgeon: Dorothy Spark, MD;  Location[facility] ENDOSCOPY;  Service: Cardiovascular;  Laterality: N/A;  . Phlebotomy    . Left and right heart catheterization with coronary angiogram N/A 07/25/2013    Procedure: LEFT AND RIGHT HEART CATHETERIZATION WITH CORONARY ANGIOGRAM;  Surgeon: Jolaine Artist, MD;  Location[facility] CATH LAB;  Service: Cardiovascular;  Laterality: N/A;  . Cardiac surgery      Hole patched    Family History  Problem Relation Age of Onset  . Cancer Mother   . Hypertension Father   . Congestive Heart Failure Father   . Hemochromatosis      family history  . Colon  cancer Paternal Aunt     No Known Allergies  Current Outpatient Prescriptions on File Prior to Visit  Medication Sig Dispense Refill  . atorvastatin (LIPITOR) 20 MG tablet Take 1 tablet (20 mg total) by mouth daily. Please call and schedule a one year follow up appointment 30 tablet 0  . losartan (COZAAR) 50 MG tablet TAKE 1 TABLET (50 MG TOTAL) BY MOUTH DAILY. 90 tablet 0  . metFORMIN (GLUCOPHAGE) 500 MG tablet Take 1 tablet (500 mg total) by mouth 2 (two) times daily with a meal. 60 tablet 3  . ranitidine (ZANTAC) 150 MG capsule Take 1 capsule (150 mg total) by mouth 2 (two) times daily. 60 capsule 0  . sildenafil (VIAGRA) 100 MG tablet Take 1 tablet (100 mg total) by mouth daily as needed for erectile dysfunction. 10 tablet 0  . testosterone cypionate (DEPOTESTOSTERONE CYPIONATE) 200 MG/ML injection Inject 1 mL (200 mg total) into the muscle every 14 (fourteen) days. As needed now 10 mL 3   No current facility-administered medications on file prior to visit.    BP 124/86 mmHg  Pulse 88  Temp(Src) 98.1 F (36.7 C) (Oral)  Ht 5\' 9"  (1.753 m)  Wt 274 lb 12.8 oz (124.648 kg)  BMI 40.56 kg/m2  SpO2 96%       Objective:   Physical Exam  General Mental Status- Alert. General Appearance- Not in acute distress.   Skin General: Color- Normal Color. Moisture- Normal Moisture.  Neck Carotid Arteries- Normal color. Moisture- Normal Moisture. No carotid bruits. No JVD.  Chest and Lung Exam Auscultation: Breath Sounds:-Normal.  Cardiovascular Auscultation:Rythm- Regular. Murmurs & Other Heart Sounds:Auscultation of the heart reveals- No Murmurs.  Abdomen Inspection:-Inspeection Normal. Palpation/Percussion:Note:No mass. Palpation and Percussion of the abdomen reveal- Non Tender, Non Distended + BS, no rebound or guarding.  Neurologic Cranial Nerve exam:- CN III-XII intact(No nystagmus), symmetric smile. Strength:- 5/5 equal and symmetric strength both upper and lower  extremities.  Derm- faint residual rash present.  See quality metrics. For foot exam.      Assessment & Plan:    For your diabetes stress strict diet and exercise. If you have time recommend maybe walking 30 minutes a day. Continue metformin. If a1-c is increased may increase metformin to 2 tab  twice a day. Recent prednisone could effect/increase a1-c. Cut back on food wife cooks.  For your hx of high cholesterol will recheck lipid panel today.  For htn continue losartan.  Labs are put in as future. Get done past 16th.  Follow up date 3 months or as needed   Davin Muramoto, Percell Miller, [place]

## 2016-02-14 ENCOUNTER — Other Ambulatory Visit: Payer: Self-pay | Admitting: *Deleted

## 2016-02-14 DIAGNOSIS — E349 Endocrine disorder, unspecified: Secondary | ICD-10-CM

## 2016-02-14 DIAGNOSIS — Q210 Ventricular septal defect: Secondary | ICD-10-CM

## 2016-02-14 DIAGNOSIS — I5032 Chronic diastolic (congestive) heart failure: Secondary | ICD-10-CM

## 2016-02-14 DIAGNOSIS — N529 Male erectile dysfunction, unspecified: Secondary | ICD-10-CM

## 2016-02-14 MED ORDER — TESTOSTERONE CYPIONATE 200 MG/ML IM SOLN: 200.0000 mg | INTRAMUSCULAR | Status: DC

## 2016-02-14 MED FILL — TESTOSTERONE CYP 200 MG/ML: 200 | 28 days supply | Qty: 2 | Fill #0

## 2016-02-25 ENCOUNTER — Other Ambulatory Visit: Payer: BLUE CROSS/BLUE SHIELD

## 2016-03-06 ENCOUNTER — Other Ambulatory Visit: Payer: Self-pay | Admitting: Cardiology

## 2016-03-06 MED FILL — LOSARTAN POTASSIUM 50 MG TA: 50 | 30 days supply | Qty: 30 | Fill #2

## 2016-03-06 MED FILL — ATORVASTATIN 20 MG TABLET: 20 | 15 days supply | Qty: 15 | Fill #0

## 2016-03-20 ENCOUNTER — Encounter: Payer: Self-pay | Admitting: Cardiology

## 2016-03-20 ENCOUNTER — Ambulatory Visit (INDEPENDENT_AMBULATORY_CARE_PROVIDER_SITE_OTHER): Payer: BLUE CROSS/BLUE SHIELD | Admitting: Cardiology

## 2016-03-20 VITALS — BP 130/86 | HR 86 | Ht 69.0 in | Wt 276.4 lb

## 2016-03-20 DIAGNOSIS — I5032 Chronic diastolic (congestive) heart failure: Secondary | ICD-10-CM | POA: Diagnosis not present

## 2016-03-20 DIAGNOSIS — G4733 Obstructive sleep apnea (adult) (pediatric): Secondary | ICD-10-CM

## 2016-03-20 DIAGNOSIS — Q2543 Congenital aneurysm of aorta: Secondary | ICD-10-CM | POA: Diagnosis not present

## 2016-03-20 DIAGNOSIS — I10 Essential (primary) hypertension: Secondary | ICD-10-CM

## 2016-03-20 DIAGNOSIS — E785 Hyperlipidemia, unspecified: Secondary | ICD-10-CM

## 2016-03-20 MED ORDER — LOSARTAN POTASSIUM 50 MG PO TABS: ORAL_TABLET | ORAL | Status: DC

## 2016-03-20 NOTE — Patient Instructions (Signed)
Medication Instructions:  Your physician recommends that you continue on your current medications as directed. Please refer to the Current Medication list given to you today.   Labwork: None ordered  Testing/Procedures: None ordered  Follow-Up: Your physician wants you to follow-up in: 1 year with Dr.Mclean You will receive a reminder letter in the mail two months in advance. If you don't receive a letter, please call our office to schedule the follow-up appointment.   Any Other Special Instructions Will Be Listed Below (If Applicable).     If you need a refill on your cardiac medications before your next appointment, please call your pharmacy.

## 2016-03-20 NOTE — Progress Notes (Signed)
Cardiology Office Note   Date:  03/20/2016   ID:  Kevin Wiggins, DOB [birth year], MRN [id number]  PCP:  Mackie Pai, PA-C  Cardiologist:  Dr. Aundra Dubin    Chief Complaint  Patient presents with  . Hypertension      History of Present Illness: Kevin Wiggins is a 55 y.o. male who presents for VSD repair follow up and HTN.    He has a history of hereditary hemochromatosis (closely followed by hematology with phlebotomies) and HTN presented to [facility] in 10/14 with symptoms consistent with CHF. Dyspnea developed in early 10/14. Prior to this, he had had no exertional symptoms. In the hospital, he was noted to be volume overloaded and was diuresed. He had a loud systolic murmur. TTE showed normal LV systolic function with a moderately dilated RV. He had what appeared to be a small peri-membranous VSD. TEE showed that this actually was probably a ruptured right sinus of valsalva aneurysm. He had a RHC/LHC done next given CHF. This showed nonobstructive CAD and moderate pulmonary hypertension. Qp/Qs was 2.4/1. He had a coronary CTA, which showed no pulmonary vein anomalies. Patient was referred to Dr Darcus Austin for congenital cardiology evaluation. He had patch repair of the ruptured SOV aneurysm in 11/14 at [facility]. Post-op course was uncomplicated.  He has done well since procedure except for wt gain.  No chest pain and no SOB. He is happily married and working on his home with plans to begin exercise at [employer] at end of the month.  He eats healthy.  His diabetes and lipds followed by PCP.   Past Medical History  Diagnosis Date  . Refusal of blood transfusions as patient is Jehovah's Witness   . Diastolic CHF ([place])     a. Acute diastolic CHF 99991111 in the setting of perimembranous VSD. Eval underway given mod pulm HTN and RV dilation.  . Borderline diabetic     a. A1C 6.6 in 07/2013 - needs to f/u PCP for likely full DM.  [name] Hemochromatosis     H63D homozyous mutation  . VSD  (ventricular septal defect), perimembranous     a. Dx 07/2013 with significant L-R shunt.  . Pulmonary HTN ([place])     a. 07/2013: Moderate pulmonary HTN with RV dilitation.  [name] CAD (coronary artery disease)     a. 07/2013: mild nonobstructive CAD by cath 07/25/13 as part of eval for VSD/SOB.  . CKD (chronic kidney disease)   . RBBB   . LAFB (left anterior fascicular block)   . Hypogonadism male   . H/O: hypothyroidism   . ED (erectile dysfunction)   . Leukocytosis     secondary to testosterone replacement  . Erythrocytosis     secondary to testosterone replacement  . Diabetes mellitus without complication ([place])   . GERD (gastroesophageal reflux disease)   . Heart murmur   . Sleep apnea     does not use CPAP sleeps on stomach  . Hyperlipidemia   . Hypertension     Past Surgical History  Procedure Laterality Date  . Tee without cardioversion N/A 07/25/2013    Procedure: TRANSESOPHAGEAL ECHOCARDIOGRAM (TEE);  Surgeon: Dorothy Spark, MD;  Location: [facility] ENDOSCOPY;  Service: Cardiovascular;  Laterality: N/A;  . Phlebotomy    . Left and right heart catheterization with coronary angiogram N/A 07/25/2013    Procedure: LEFT AND RIGHT HEART CATHETERIZATION WITH CORONARY ANGIOGRAM;  Surgeon: Jolaine Artist, MD;  Location: [facility] CATH LAB;  Service: Cardiovascular;  Laterality: N/A;  . Cardiac surgery      Hole patched     Current Outpatient Prescriptions  Medication Sig Dispense Refill  . atorvastatin (LIPITOR) 20 MG tablet Take 1 tablet (20 mg total) by mouth daily. Patient is overdue for an appointment. Please call and schedule for further refills 15 tablet 0  . losartan (COZAAR) 50 MG tablet TAKE 1 TABLET (50 MG TOTAL) BY MOUTH DAILY. 90 tablet 3  . metFORMIN (GLUCOPHAGE) 500 MG tablet Take 1 tablet (500 mg total) by mouth 2 (two) times daily with a meal. 60 tablet 3  . ranitidine (ZANTAC) 150 MG capsule Take 1 capsule (150 mg total) by mouth 2 (two) times daily. 60 capsule 0  .  sildenafil (VIAGRA) 100 MG tablet Take 1 tablet (100 mg total) by mouth daily as needed for erectile dysfunction. 10 tablet 0  . testosterone cypionate (DEPOTESTOSTERONE CYPIONATE) 200 MG/ML injection Inject 1 mL (200 mg total) into the muscle every 14 (fourteen) days. As needed now 10 mL 3   No current facility-administered medications for this visit.    Allergies:   Amlodipine and Lisinopril    Social History:  The patient  reports that he has never smoked. He has never used smokeless tobacco. He reports that he does not drink alcohol or use illicit drugs.   Family History:  The patient's family history includes Cancer in his mother; Colon cancer in his paternal aunt; Congestive Heart Failure in his father; Hypertension in his father.    ROS:  General:no colds or fevers, no weight changes Skin:no rashes or ulcers HEENT:no blurred vision, no congestion CV:see HPI PUL:see HPI GI:no diarrhea constipation or melena, no indigestion GU:no hematuria, no dysuria MS:no joint pain, no claudication Neuro:no syncope, no lightheadedness Endo:+ diabetes followed by PCP, no thyroid disease  Wt Readings from Last 3 Encounters:  03/20/16 276 lb 6.4 oz (125.374 kg)  02/13/16 274 lb 12.8 oz (124.648 kg)  02/07/16 279 lb (126.554 kg)     PHYSICAL EXAM: VS:  BP 130/86 mmHg  Pulse 86  Ht 5\' 9"  (1.753 m)  Wt 276 lb 6.4 oz (125.374 kg)  BMI 40.80 kg/m2  SpO2 97% , BMI Body mass index is 40.8 kg/(m^2). General:Pleasant affect, NAD Skin:Warm and dry, brisk capillary refill HEENT:normocephalic, sclera clear, mucus membranes moist Neck:supple, no JVD, no bruits  Heart:S1S2 RRR with soft murmur, gallup, rub or click Lungs:clear without rales, rhonchi, or wheezes [id number], soft, non tender, + BS, do not palpate liver spleen or masses Ext:no lower ext edema, 2+ pedal pulses, 2+ radial pulses Neuro:alert and oriented X 3, MAE, follows commands, + facial symmetry    EKG:  EKG is ordered  today. The ekg ordered today demonstrates SR with RBBB, LAFB and LVH no changes from previous.    Recent Labs: 06/06/2015: TSH 0.81 02/07/2016: ALT 78*; BUN 9; Creatinine, Ser 1.32*; HGB 17.5*; Platelets 225; Potassium, Ser 4.6; Sodium 136    Lipid Panel    Component Value Date/Time   CHOL 125 11/22/2015 0707   TRIG 122.0 11/22/2015 0707   HDL 44.30 11/22/2015 0707   CHOLHDL 3 11/22/2015 0707   VLDL 24.4 11/22/2015 0707   LDLCALC 56 11/22/2015 0707       Other studies Reviewed: Additional studies/ records that were reviewed today include: previous notes and echo 2015. .   ASSESSMENT AND PLAN:   1. Ruptured sinus of valsalva aneurysm: s/p patch repair at [facility] in 11/14. He is doing will no SOB.  He  does not have a significant murmur on exam. Baseline echo post-op in 1/15 was stable. reminded to  use antibiotics with dental work.  follow up in 1 year.  2. Chronic diastolic CHF: Suspect this was related to LV volume load from ruptured SOV aneurysm, now resolved.   3. OSA: Severe by sleep study but he is unable to tolerate CPAP.   4. Hemochromatosis: Iron indices have been well-controlled. He is followed by hematology. He does not have a dilated cardiomyopathy. He was unable to tolerate MRI to assess for myocardial hemochromatosis involvement.    5. Obesity: He needs weight loss. We discussed diet and exercise. Plan is to go to the gym in July. .  6. HTN: Side effects from amlodipine and cough with lisinopril. BP now controlled with losartan.   Current medicines are reviewed with the patient today.  The patient Has no concerns regarding medicines.  The following changes have been made:  See above Labs/ tests ordered today include:see above  Disposition:   FU:  see above  Signed, Cecilie Kicks, NP  03/20/2016 5:32 PM    [facility] [place], [place], [address] [place], [place] Phone: [phone]; Fax: [phone]

## 2016-03-21 ENCOUNTER — Ambulatory Visit (HOSPITAL_BASED_OUTPATIENT_CLINIC_OR_DEPARTMENT_OTHER): Payer: BLUE CROSS/BLUE SHIELD | Admitting: Hematology & Oncology

## 2016-03-21 ENCOUNTER — Other Ambulatory Visit (HOSPITAL_BASED_OUTPATIENT_CLINIC_OR_DEPARTMENT_OTHER): Payer: BLUE CROSS/BLUE SHIELD

## 2016-03-21 ENCOUNTER — Encounter: Payer: Self-pay | Admitting: Hematology & Oncology

## 2016-03-21 ENCOUNTER — Other Ambulatory Visit: Payer: Self-pay | Admitting: Cardiology

## 2016-03-21 ENCOUNTER — Ambulatory Visit (HOSPITAL_BASED_OUTPATIENT_CLINIC_OR_DEPARTMENT_OTHER): Payer: BLUE CROSS/BLUE SHIELD

## 2016-03-21 VITALS — BP 126/85 | HR 91 | Temp 97.7°F | Resp 16 | Ht 69.0 in | Wt 275.0 lb

## 2016-03-21 DIAGNOSIS — E291 Testicular hypofunction: Secondary | ICD-10-CM | POA: Diagnosis not present

## 2016-03-21 DIAGNOSIS — D751 Secondary polycythemia: Secondary | ICD-10-CM

## 2016-03-21 DIAGNOSIS — L270 Generalized skin eruption due to drugs and medicaments taken internally: Secondary | ICD-10-CM

## 2016-03-21 LAB — COMPREHENSIVE METABOLIC PANEL
ALK PHOS: 58 U/L (ref 40–150)
ALT: 59 U/L — AB (ref 0–55)
ANION GAP: 11 meq/L (ref 3–11)
AST: 28 U/L (ref 5–34)
Albumin: 4 g/dL (ref 3.5–5.0)
BUN: 16.9 mg/dL (ref 7.0–26.0)
CO2: 23 mEq/L (ref 22–29)
Calcium: 9.9 mg/dL (ref 8.4–10.4)
Chloride: 101 mEq/L (ref 98–109)
Creatinine: 1.3 mg/dL (ref 0.7–1.3)
EGFR: 62 mL/min/{1.73_m2} — AB (ref >90)
GLUCOSE: 205 mg/dL — AB (ref 70–140)
POTASSIUM: 4.4 meq/L (ref 3.5–5.1)
SODIUM: 136 meq/L (ref 136–145)
Total Bilirubin: 0.75 mg/dL (ref 0.20–1.20)
Total Protein: 7.6 g/dL (ref 6.4–8.3)

## 2016-03-21 LAB — CBC WITH DIFFERENTIAL (CANCER CENTER ONLY)
BASO#: 0 10*3/uL (ref 0.0–0.2)
BASO%: 0.3 % (ref 0.0–2.0)
EOS%: 3.6 % (ref 0.0–7.0)
Eosinophils Absolute: 0.3 10*3/uL (ref 0.0–0.5)
HEMATOCRIT: 48.9 % (ref 38.7–49.9)
HGB: 17.4 g/dL — ABNORMAL HIGH (ref 13.0–17.1)
LYMPH#: 1.8 10*3/uL (ref 0.9–3.3)
LYMPH%: 20.3 % (ref 14.0–48.0)
MCH: 31.5 pg (ref 28.0–33.4)
MCHC: 35.6 g/dL (ref 32.0–35.9)
MCV: 88 fL (ref 82–98)
MONO#: 1 10*3/uL — ABNORMAL HIGH (ref 0.1–0.9)
MONO%: 11.5 % (ref 0.0–13.0)
NEUT#: 5.7 10*3/uL (ref 1.5–6.5)
NEUT%: 64.3 % (ref 40.0–80.0)
PLATELETS: 247 10*3/uL (ref 145–400)
RBC: 5.53 10*6/uL (ref 4.20–5.70)
RDW: 13.4 % (ref 11.1–15.7)
WBC: 8.8 10*3/uL (ref 4.0–10.0)

## 2016-03-21 MED ORDER — TRIAMCINOLONE ACETONIDE 0.5 % EX OINT: 1.0000 "application " | TOPICAL_OINTMENT | Freq: Two times a day (BID) | CUTANEOUS | Status: DC

## 2016-03-21 MED FILL — TRIAMCINOLONE 0.5% OINTMENT: 0.5 | 10 days supply | Qty: 30 | Fill #0

## 2016-03-21 MED FILL — TESTOSTERONE CYP 200 MG/ML: 200 | 28 days supply | Qty: 2 | Fill #1

## 2016-03-21 MED FILL — metFORMIN HCL 500 MG TABS: 500 | 30 days supply | Qty: 60 | Fill #3

## 2016-03-21 MED FILL — ATORVASTATIN 20 MG TABLET: 20 | 30 days supply | Qty: 30 | Fill #0

## 2016-03-21 NOTE — Patient Instructions (Signed)

## 2016-03-21 NOTE — Progress Notes (Signed)
Hematology and Oncology Follow Up Visit  NORVELL RALSTON [id number] [birth year] 55 y.o. 03/21/2016   Principle Diagnosis:  1.  Hemochromatosis (homozygous for H63D mutation). 2. Hypogonadism secondary to hemochromatosis. 3. Erythrocytosis secondary to testosterone therapy. 4. Ruptured right sinus of Valsalva aneurysm.  Current Therapy:   1. The patient status post cardiac surgery to repair the sinus of     Valsalva aneurysm. 2. Phlebotomy to keep hematocrit below 48%. 3. Testosterone 200 mg IM q.2 weeks.     Interim History:  Mr.  Magnani is back for followup. He looks real good. He is working out more. He seems to be quite excited about this. He is incredibly dedicated to exercise and trying to lose weight.  He is on metformin twice a day now. Hopefully, this will help his blood sugars.  He is still enjoying married life.  He is still working.  He's had no cough. He's had no cardiac issues.  He still takes his testosterone. I'm sure this is why his hemoglobin has been on the higher side.   He does have a little bit of a rash on his ankles and on the inner aspect of his left leg. He's been applying some over-the-counter cream.  Overall, his performance status is ECOG 0.  Medications:  Current outpatient prescriptions:  .  losartan (COZAAR) 50 MG tablet, TAKE 1 TABLET (50 MG TOTAL) BY MOUTH DAILY., Disp: 90 tablet, Rfl: 3 .  metFORMIN (GLUCOPHAGE) 500 MG tablet, Take 1 tablet (500 mg total) by mouth 2 (two) times daily with a meal., Disp: 60 tablet, Rfl: 3 .  ranitidine (ZANTAC) 150 MG capsule, Take 1 capsule (150 mg total) by mouth 2 (two) times daily., Disp: 60 capsule, Rfl: 0 .  sildenafil (VIAGRA) 100 MG tablet, Take 1 tablet (100 mg total) by mouth daily as needed for erectile dysfunction., Disp: 10 tablet, Rfl: 0 .  testosterone cypionate (DEPOTESTOSTERONE CYPIONATE) 200 MG/ML injection, Inject 1 mL (200 mg total) into the muscle every 14 (fourteen) days. As needed now, Disp: 10  mL, Rfl: 3 .  atorvastatin (LIPITOR) 20 MG tablet, Take 1 tablet (20 mg total) by mouth daily., Disp: 30 tablet, Rfl: 11 .  triamcinolone ointment (KENALOG) 0.5 %, Apply 1 application topically 2 (two) times daily., Disp: 30 g, Rfl: 0  Allergies:  Allergies  Allergen Reactions  . Amlodipine   . Lisinopril     cough    Past Medical History, Surgical history, Social history, and Family History were reviewed and updated.  Review of Systems: As above  Physical Exam:  height is 5\' 9"  (1.753 m) and weight is 275 lb (124.739 kg). His oral temperature is 97.7 F (36.5 C). His blood pressure is 126/85 and his pulse is 91. His respiration is 16.   Well-developed and well-nourished white gentleman. Head and neck exam shows no ocular or oral lesions. He has no conjunctival inflammation. He has no palpable cervical or supraclavicular lymph nodes. Lungs are clear. Cardiac exam regular rate and rhythm with no murmurs rubs or bruits. Abdomen is soft. He is moderately obese. There is no fluid wave. There is no palpable liver or spleen tip. Back exam shows no tenderness over the spine ribs or hips. Extremities shows no clubbing cyanosis or edema. Neurological exam shows no focal neurological deficits. Skin exam a macular type rash on his ankles. He also has areas of macular rash with some papular areas on his inner thigh. No blisters are noted. .   Lab Results  Component Value Date   WBC 8.8 03/21/2016   HGB 17.4* 03/21/2016   HCT 48.9 03/21/2016   MCV 88 03/21/2016   PLT 247 03/21/2016     Chemistry      Component Value Date/Time   NA 136 03/21/2016 1343   NA 136 02/07/2016 1449   NA 139 12/24/2015 1343   NA 136 11/22/2015 0707   K 4.4 03/21/2016 1343   K 4.6 02/07/2016 1449   K 4.6 12/24/2015 1343   K 4.0 11/22/2015 0707   CL 99 02/07/2016 1449   CL 98 12/24/2015 1343   CL 99 11/22/2015 0707   CO2 23 03/21/2016 1343   CO2 28 02/07/2016 1449   CO2 31 12/24/2015 1343   CO2 28 11/22/2015  0707   BUN 16.9 03/21/2016 1343   BUN 9 02/07/2016 1449   BUN 14 12/24/2015 1343   BUN 14 11/22/2015 0707   CREATININE 1.3 03/21/2016 1343   CREATININE 1.32* 02/07/2016 1449   CREATININE 1.3* 12/24/2015 1343   CREATININE 1.15 11/22/2015 0707      Component Value Date/Time   CALCIUM 9.9 03/21/2016 1343   CALCIUM 9.6 02/07/2016 1449   CALCIUM 9.5 12/24/2015 1343   CALCIUM 9.5 11/22/2015 0707   ALKPHOS 58 03/21/2016 1343   ALKPHOS 61 02/07/2016 1449   ALKPHOS 53 12/24/2015 1343   ALKPHOS 58 11/22/2015 0707   AST 28 03/21/2016 1343   AST 42* 02/07/2016 1449   AST 41* 12/24/2015 1343   AST 27 11/22/2015 0707   ALT 59* 03/21/2016 1343   ALT 78* 02/07/2016 1449   ALT 70* 12/24/2015 1343   ALT 54* 11/22/2015 0707   BILITOT 0.75 03/21/2016 1343   BILITOT 0.5 02/07/2016 1449   BILITOT 1.10 12/24/2015 1343   BILITOT 0.8 11/22/2015 0707         Impression and Plan: Mr. Batterson is 55 year old white male.[name] He has a homozygous mutation for one of the minor genes for hemochromatosis.    We will go ahead and phlebotomize him today. His hemoglobin and hematocrit are up quite a bit.  I don't think this rash is shingles. It almost looks like poison ivy. I will go ahead and try A topical steroid.  I think we've probably get him back now in 3 months. I the we are okay to wait that long.[name]   [name], MD 6/16/20176:03 PM

## 2016-03-24 LAB — IRON AND TIBC
%SAT: 45 % (ref 20–55)
IRON: 141 ug/dL (ref 42–163)
TIBC: 314 ug/dL (ref 202–409)
UIBC: 173 ug/dL (ref 117–376)

## 2016-03-24 LAB — FERRITIN: FERRITIN: 48 ng/mL (ref 22–316)

## 2016-04-07 MED FILL — LOSARTAN POTASSIUM 50 MG TA: 50 | 30 days supply | Qty: 30 | Fill #0

## 2016-04-25 ENCOUNTER — Other Ambulatory Visit: Payer: Self-pay | Admitting: Medical

## 2016-04-25 MED FILL — metFORMIN HCL 500 MG TABS: 500 | 30 days supply | Qty: 60 | Fill #0

## 2016-04-25 MED FILL — ATORVASTATIN 20 MG TABLET: 20 | 30 days supply | Qty: 30 | Fill #1

## 2016-05-09 MED FILL — LOSARTAN POTASSIUM 50 MG TA: 50 | 30 days supply | Qty: 30 | Fill #1

## 2016-05-09 MED FILL — TESTOSTERONE CYP 200 MG/ML: 200 | 28 days supply | Qty: 2 | Fill #2

## 2016-05-14 ENCOUNTER — Other Ambulatory Visit: Payer: Self-pay | Admitting: Orthopaedic Surgery

## 2016-05-14 DIAGNOSIS — M25562 Pain in left knee: Secondary | ICD-10-CM

## 2016-05-15 ENCOUNTER — Encounter: Payer: Self-pay | Admitting: Medical

## 2016-05-15 ENCOUNTER — Ambulatory Visit (INDEPENDENT_AMBULATORY_CARE_PROVIDER_SITE_OTHER): Payer: BLUE CROSS/BLUE SHIELD | Admitting: Medical

## 2016-05-15 VITALS — BP 110/70 | HR 90 | Temp 98.1°F | Ht 69.0 in | Wt 274.4 lb

## 2016-05-15 DIAGNOSIS — R5383 Other fatigue: Secondary | ICD-10-CM

## 2016-05-15 DIAGNOSIS — E785 Hyperlipidemia, unspecified: Secondary | ICD-10-CM | POA: Diagnosis not present

## 2016-05-15 DIAGNOSIS — E119 Type 2 diabetes mellitus without complications: Secondary | ICD-10-CM

## 2016-05-15 DIAGNOSIS — I10 Essential (primary) hypertension: Secondary | ICD-10-CM

## 2016-05-15 DIAGNOSIS — I5033 Acute on chronic diastolic (congestive) heart failure: Secondary | ICD-10-CM

## 2016-05-15 NOTE — Patient Instructions (Addendum)
Very similar plan as before. Please get labs done tomorrow am fasting.  For your diabetes stress strict diet and exercise. If you have time recommend maybe walking 30 minutes a day. Continue metformin. If a1-c is increased may increase metformin to 2 tab  twice a day. Recent prednisone could effect/increase a1-c. Cut back on food wife cooks.  For your hx of high cholesterol will recheck lipid panel today.  For htn continue losartan.   Follow up date 3 months or as needed

## 2016-05-15 NOTE — Progress Notes (Signed)
Subjective:    Patient ID: Kevin Wiggins, male    DOB: 1961-09-22, 55 y.o.   MRN: [id number]  HPI   Pt in for follow up.  Pt update me that he may have torn left knee meniscus. On 19 th will get mri. Seeing [facility]. Pt is on tramadol 1 tab every 8 hours as needed. But it makes him too drowsy. Pt declined offer for diclofenac.  Pt did not get his a1c that I had ordered last time. Pt states he is eating a lot of vegetables.  Pt has not been able to exercise due to knee pain. Only light yard work now. Was going to gym before injury.  Pt is on metformin. Tolerated the medication.  Pt bp is controlled today. Pt does not check his bp at home regularly.   I had put in lipid panel fasting last time but labs were not done.       Review of Systems  Constitutional: Negative for chills, fatigue and fever.  Respiratory: Negative for cough, chest tightness, shortness of breath and wheezing.   Cardiovascular: Negative for chest pain and palpitations.  Gastrointestinal: Negative for abdominal pain, constipation, diarrhea and vomiting.  Endocrine: Negative for polydipsia, polyphagia and polyuria.  Musculoskeletal: Negative for back pain and gait problem.  Neurological: Negative for dizziness and light-headedness.  Hematological: Negative for adenopathy. Does not bruise/bleed easily.  Psychiatric/Behavioral: Negative for behavioral problems and confusion.    Past Medical History:  Diagnosis Date  . Borderline diabetic    a. A1C 6.6 in 07/2013 - needs to f/u PCP for likely full DM.  [name] CAD (coronary artery disease)    a. 07/2013: mild nonobstructive CAD by cath 07/25/13 as part of eval for VSD/SOB.  . CKD (chronic kidney disease)   . Diabetes mellitus without complication ([place])   . Diastolic CHF ([place])    a. Acute diastolic CHF 99991111 in the setting of perimembranous VSD. Eval underway given mod pulm HTN and RV dilation.  . ED (erectile dysfunction)   . Erythrocytosis    secondary to testosterone replacement  . GERD (gastroesophageal reflux disease)   . H/O: hypothyroidism   . Heart murmur   . Hemochromatosis    H63D homozyous mutation  . Hyperlipidemia   . Hypertension   . Hypogonadism male   . LAFB (left anterior fascicular block)   . Leukocytosis    secondary to testosterone replacement  . Pulmonary HTN ([place])    a. 07/2013: Moderate pulmonary HTN with RV dilitation.  . RBBB   . Refusal of blood transfusions as patient is Jehovah's Witness   . Sleep apnea    does not use CPAP sleeps on stomach  . VSD (ventricular septal defect), perimembranous    a. Dx 07/2013 with significant L-R shunt.     Social History   Social History  . Marital status: Married    Spouse name: N/A  . Number of children: N/A  . Years of education: N/A   Occupational History  .  [employer]    Maintenance    Social History Main Topics  . Smoking status: Never Smoker  . Smokeless tobacco: Never Used     Comment: never used tobacco  . Alcohol use No  . Drug use: No  . Sexual activity: Not on file   Other Topics Concern  . Not on file   Social History Narrative  . No narrative on file    Past Surgical History:  Procedure Laterality Date  .  CARDIAC SURGERY     Hole patched  . LEFT AND RIGHT HEART CATHETERIZATION WITH CORONARY ANGIOGRAM N/A 07/25/2013   Procedure: LEFT AND RIGHT HEART CATHETERIZATION WITH CORONARY ANGIOGRAM;  Surgeon: Jolaine Artist, MD;  Location: [facility] CATH LAB;  Service: Cardiovascular;  Laterality: N/A;  . phlebotomy    . TEE WITHOUT CARDIOVERSION N/A 07/25/2013   Procedure: TRANSESOPHAGEAL ECHOCARDIOGRAM (TEE);  Surgeon: Dorothy Spark, MD;  Location: [facility] ENDOSCOPY;  Service: Cardiovascular;  Laterality: N/A;    Family History  Problem Relation Age of Onset  . Cancer Mother   . Hypertension Father   . Congestive Heart Failure Father   . Hemochromatosis      family history  . Colon cancer Paternal Aunt     Allergies    Allergen Reactions  . Amlodipine Other (See Comments)    Pt reports dizziness and sleepy.   . Lisinopril Other (See Comments)    cough    Current Outpatient Prescriptions on File Prior to Visit  Medication Sig Dispense Refill  . atorvastatin (LIPITOR) 20 MG tablet Take 1 tablet (20 mg total) by mouth daily. 30 tablet 11  . losartan (COZAAR) 50 MG tablet TAKE 1 TABLET (50 MG TOTAL) BY MOUTH DAILY. 90 tablet 3  . metFORMIN (GLUCOPHAGE) 500 MG tablet TAKE 1 TABLET (500 MG TOTAL) BY MOUTH 2 (TWO) TIMES DAILY WITH A MEAL. 60 tablet 3  . sildenafil (VIAGRA) 100 MG tablet Take 1 tablet (100 mg total) by mouth daily as needed for erectile dysfunction. 10 tablet 0  . testosterone cypionate (DEPOTESTOSTERONE CYPIONATE) 200 MG/ML injection Inject 1 mL (200 mg total) into the muscle every 14 (fourteen) days. As needed now 10 mL 3  . triamcinolone ointment (KENALOG) 0.5 % Apply 1 application topically 2 (two) times daily. 30 g 0   No current facility-administered medications on file prior to visit.     BP 110/70 (BP Location: Left Arm, Patient Position: Sitting, Cuff Size: Normal)   Pulse 90   Temp 98.1 F (36.7 C) (Oral)   Ht 5\' 9"  (1.753 m)   Wt 274 lb 6.4 oz (124.5 kg)   SpO2 96%   BMI 40.52 kg/m       Objective:   Physical Exam  General Mental Status- Alert. General Appearance- Not in acute distress.   Skin General: Color- Normal Color. Moisture- Normal Moisture.  Neck Carotid Arteries- Normal color. Moisture- Normal Moisture. No carotid bruits. No JVD.  Chest and Lung Exam Auscultation: Breath Sounds:-Normal.  Cardiovascular Auscultation:Rythm- Regular. Murmurs & Other Heart Sounds:Auscultation of the heart reveals- No Murmurs.  Abdomen Inspection:-Inspeection Normal. Palpation/Percussion:Note:No mass. Palpation and Percussion of the abdomen reveal- Non Tender, Non Distended + BS, no rebound or guarding.    Neurologic Cranial Nerve exam:- CN III-XII intact(No  nystagmus), symmetric smile. Strength:- 5/5 equal and symmetric strength both upper and lower extremities.  Lower ext- no pedal edema      Assessment & Plan:  Very similar plan as before. Please get labs done tomorrow am fasting.  For your diabetes stress strict diet and exercise. If you have time recommend maybe walking 30 minutes a day. Continue metformin. If a1-c is increased may increase metformin to 2 tab  twice a day. Recent prednisone could effect/increase a1-c. Cut back on food wife cooks.  For your hx of high cholesterol will recheck lipid panel today.  For htn continue losartan.   Follow up date 3 months or as needed

## 2016-05-15 NOTE — Progress Notes (Signed)
Pre visit review using our clinic review tool, if applicable. No additional management support is needed unless otherwise documented below in the visit note./HSM  

## 2016-05-19 ENCOUNTER — Other Ambulatory Visit (INDEPENDENT_AMBULATORY_CARE_PROVIDER_SITE_OTHER): Payer: BLUE CROSS/BLUE SHIELD

## 2016-05-19 DIAGNOSIS — E119 Type 2 diabetes mellitus without complications: Secondary | ICD-10-CM

## 2016-05-19 DIAGNOSIS — I10 Essential (primary) hypertension: Secondary | ICD-10-CM | POA: Diagnosis not present

## 2016-05-19 DIAGNOSIS — R5383 Other fatigue: Secondary | ICD-10-CM | POA: Diagnosis not present

## 2016-05-19 DIAGNOSIS — E785 Hyperlipidemia, unspecified: Secondary | ICD-10-CM | POA: Diagnosis not present

## 2016-05-19 LAB — CBC WITH DIFFERENTIAL/PLATELET
Basophils Absolute: 0 10*3/uL (ref 0.0–0.1)
Basophils Relative: 0.5 % (ref 0.0–3.0)
Eosinophils Absolute: 0.5 10*3/uL (ref 0.0–0.7)
Eosinophils Relative: 5.7 % — ABNORMAL HIGH (ref 0.0–5.0)
HCT: 46.7 % (ref 39.0–52.0)
HEMOGLOBIN: 16.2 g/dL (ref 13.0–17.0)
LYMPHS ABS: 1.6 10*3/uL (ref 0.7–4.0)
LYMPHS PCT: 18.7 % (ref 12.0–46.0)
MCHC: 34.6 g/dL (ref 30.0–36.0)
MCV: 88.2 fl (ref 78.0–100.0)
MONO ABS: 0.8 10*3/uL (ref 0.1–1.0)
Monocytes Relative: 9 % (ref 3.0–12.0)
NEUTROS ABS: 5.7 10*3/uL (ref 1.4–7.7)
NEUTROS PCT: 66.1 % (ref 43.0–77.0)
Platelets: 216 10*3/uL (ref 150.0–400.0)
RBC: 5.29 Mil/uL (ref 4.22–5.81)
RDW: 15.3 % (ref 11.5–15.5)
WBC: 8.6 10*3/uL (ref 4.0–10.5)

## 2016-05-19 LAB — COMPREHENSIVE METABOLIC PANEL
ALBUMIN: 4.2 g/dL (ref 3.5–5.2)
ALT: 46 U/L (ref 0–53)
AST: 24 U/L (ref 0–37)
Alkaline Phosphatase: 56 U/L (ref 39–117)
BUN: 17 mg/dL (ref 6–23)
CHLORIDE: 102 meq/L (ref 96–112)
CO2: 28 mEq/L (ref 19–32)
Calcium: 9.3 mg/dL (ref 8.4–10.5)
Creatinine, Ser: 1.3 mg/dL (ref 0.40–1.50)
GFR: 60.76 mL/min (ref >60.00)
Glucose, Bld: 166 mg/dL — ABNORMAL HIGH (ref 70–99)
POTASSIUM: 4.2 meq/L (ref 3.5–5.1)
SODIUM: 137 meq/L (ref 135–145)
Total Bilirubin: 0.6 mg/dL (ref 0.2–1.2)
Total Protein: 7 g/dL (ref 6.0–8.3)

## 2016-05-19 LAB — LIPID PANEL
CHOL/HDL RATIO: 3
Cholesterol: 128 mg/dL (ref 0–200)
HDL: 46.7 mg/dL (ref >39.00)
LDL CALC: 63 mg/dL (ref 0–99)
NonHDL: 80.82
TRIGLYCERIDES: 90 mg/dL (ref 0.0–149.0)
VLDL: 18 mg/dL (ref 0.0–40.0)

## 2016-05-19 LAB — TSH: TSH: 1.03 u[IU]/mL (ref 0.35–4.50)

## 2016-05-19 LAB — HEMOGLOBIN A1C: HEMOGLOBIN A1C: 8 % — AB (ref 4.6–6.5)

## 2016-05-20 LAB — MICROALBUMIN, URINE: MICROALB UR: 71.6 mg/dL

## 2016-05-21 MED ORDER — METFORMIN HCL 500 MG PO TABS: 1000.0000 mg | ORAL_TABLET | Freq: Two times a day (BID) | ORAL | 3 refills | Status: DC

## 2016-05-21 MED FILL — metFORMIN HCL 500 MG TABS: 500 | 30 days supply | Qty: 120 | Fill #0

## 2016-05-21 NOTE — Addendum Note (Signed)
Addended by: Tasia Catchings on: 05/21/2016 10:15 AM   Modules accepted: Orders

## 2016-05-23 ENCOUNTER — Encounter: Payer: Self-pay | Admitting: Medical

## 2016-05-23 ENCOUNTER — Telehealth: Payer: Self-pay | Admitting: Medical

## 2016-05-23 NOTE — Telephone Encounter (Signed)
Attempted to contact patient via phone. Mailed letter 05/23/2016 informing patient that appointment for 11/10 @ 2:30 with Mackie Pai has been cancelled and need to be rescheduled. Appointment cancelled.

## 2016-05-24 ENCOUNTER — Ambulatory Visit
Admission: RE | Admit: 2016-05-24 | Discharge: 2016-05-24 | Disposition: A | Discharge status: Home / Self Care | Payer: BLUE CROSS/BLUE SHIELD | Source: Ambulatory Visit | Attending: Orthopaedic Surgery | Admitting: Orthopaedic Surgery

## 2016-05-24 DIAGNOSIS — M25562 Pain in left knee: Secondary | ICD-10-CM

## 2016-05-29 ENCOUNTER — Encounter (HOSPITAL_COMMUNITY): Payer: Self-pay

## 2016-05-29 DIAGNOSIS — Z79899 Other long term (current) drug therapy: Secondary | ICD-10-CM | POA: Insufficient documentation

## 2016-05-29 DIAGNOSIS — I130 Hypertensive heart and chronic kidney disease with heart failure and stage 1 through stage 4 chronic kidney disease, or unspecified chronic kidney disease: Secondary | ICD-10-CM | POA: Insufficient documentation

## 2016-05-29 DIAGNOSIS — I5033 Acute on chronic diastolic (congestive) heart failure: Secondary | ICD-10-CM | POA: Insufficient documentation

## 2016-05-29 DIAGNOSIS — E039 Hypothyroidism, unspecified: Secondary | ICD-10-CM | POA: Insufficient documentation

## 2016-05-29 DIAGNOSIS — E1122 Type 2 diabetes mellitus with diabetic chronic kidney disease: Secondary | ICD-10-CM | POA: Diagnosis not present

## 2016-05-29 DIAGNOSIS — R0789 Other chest pain: Secondary | ICD-10-CM | POA: Insufficient documentation

## 2016-05-29 DIAGNOSIS — R002 Palpitations: Secondary | ICD-10-CM | POA: Diagnosis not present

## 2016-05-29 DIAGNOSIS — I2510 Atherosclerotic heart disease of native coronary artery without angina pectoris: Secondary | ICD-10-CM | POA: Diagnosis not present

## 2016-05-29 DIAGNOSIS — Z7984 Long term (current) use of oral hypoglycemic drugs: Secondary | ICD-10-CM | POA: Insufficient documentation

## 2016-05-29 DIAGNOSIS — N189 Chronic kidney disease, unspecified: Secondary | ICD-10-CM | POA: Insufficient documentation

## 2016-05-29 NOTE — ED Triage Notes (Signed)
Pt states that he started having palpations several days ago but today around 6pm the palpations have not went away. Pain radiates to his shoulder. Also c/o of sob. Denies n/v and diaphoresis

## 2016-05-30 ENCOUNTER — Emergency Department (HOSPITAL_COMMUNITY)
Admission: EM | Admit: 2016-05-30 | Discharge: 2016-05-30 | Disposition: A | Discharge status: Home / Self Care | Payer: BLUE CROSS/BLUE SHIELD | Attending: Emergency Medicine | Admitting: Emergency Medicine

## 2016-05-30 ENCOUNTER — Emergency Department (HOSPITAL_COMMUNITY): Payer: BLUE CROSS/BLUE SHIELD

## 2016-05-30 DIAGNOSIS — R0789 Other chest pain: Secondary | ICD-10-CM

## 2016-05-30 DIAGNOSIS — R002 Palpitations: Secondary | ICD-10-CM

## 2016-05-30 LAB — CBC
HEMATOCRIT: 47.4 % (ref 39.0–52.0)
Hemoglobin: 16.3 g/dL (ref 13.0–17.0)
MCH: 30.8 pg (ref 26.0–34.0)
MCHC: 34.4 g/dL (ref 30.0–36.0)
MCV: 89.4 fL (ref 78.0–100.0)
Platelets: 236 10*3/uL (ref 150–400)
RBC: 5.3 MIL/uL (ref 4.22–5.81)
RDW: 14.2 % (ref 11.5–15.5)
WBC: 9.7 10*3/uL (ref 4.0–10.5)

## 2016-05-30 LAB — MAGNESIUM: Magnesium: 1.8 mg/dL (ref 1.7–2.4)

## 2016-05-30 LAB — I-STAT TROPONIN, ED
TROPONIN I, POC: 0 ng/mL (ref 0.00–0.08)
Troponin i, poc: 0 ng/mL (ref 0.00–0.08)

## 2016-05-30 LAB — BASIC METABOLIC PANEL
Anion gap: 11 (ref 5–15)
BUN: 13 mg/dL (ref 6–20)
CALCIUM: 9.6 mg/dL (ref 8.9–10.3)
CO2: 22 mmol/L (ref 22–32)
Chloride: 101 mmol/L (ref 101–111)
Creatinine, Ser: 1.35 mg/dL — ABNORMAL HIGH (ref 0.61–1.24)
GFR calc Af Amer: >60 mL/min (ref >60)
GFR, EST NON AFRICAN AMERICAN: 58 mL/min — AB (ref >60)
GLUCOSE: 179 mg/dL — AB (ref 65–99)
Potassium: 3.5 mmol/L (ref 3.5–5.1)
Sodium: 134 mmol/L — ABNORMAL LOW (ref 135–145)

## 2016-05-30 NOTE — ED Provider Notes (Signed)
[facility] DEPT Provider Note   CSN: [id number] Arrival date & time: 05/29/16  2337     History   Chief Complaint Chief Complaint  Patient presents with  . Palpitations    HPI Kevin Wiggins is a 55 y.o. male.  Patient with a history of VSD that was surgically repaired presents today with a chief complaint of palpitations.  He reports that the palpitations have been occurring intermittently over the past couple of days, but has been occurring more frequently this evening.  He states that he feels his heart beating for a couple of beats and then it resolves.  He also reports associated tightness of his test onset earlier this evening around 7 PM.  Tightness has been constant since that time. He was not doing anything exertional at the onset of pain.   Nothing makes it better or worse.  He denies associated SOB, fever, chills, dizziness, syncope, or any other symptoms.  He had a Cardiac Cath in October 2014, which showed mild nonobstructive disease.  No history of DVT or PE.  No prolonged travel or surgeries in the past month.  No LE edema or pain.        Past Medical History:  Diagnosis Date  . Borderline diabetic    a. A1C 6.6 in 07/2013 - needs to f/u PCP for likely full DM.  [name] CAD (coronary artery disease)    a. 07/2013: mild nonobstructive CAD by cath 07/25/13 as part of eval for VSD/SOB.  . CKD (chronic kidney disease)   . Diabetes mellitus without complication ([place])   . Diastolic CHF ([place])    a. Acute diastolic CHF 99991111 in the setting of perimembranous VSD. Eval underway given mod pulm HTN and RV dilation.  . ED (erectile dysfunction)   . Erythrocytosis    secondary to testosterone replacement  . GERD (gastroesophageal reflux disease)   . H/O: hypothyroidism   . Heart murmur   . Hemochromatosis    H63D homozyous mutation  . Hyperlipidemia   . Hypertension   . Hypogonadism male   . LAFB (left anterior fascicular block)   . Leukocytosis    secondary to  testosterone replacement  . Pulmonary HTN ([place])    a. 07/2013: Moderate pulmonary HTN with RV dilitation.  . RBBB   . Refusal of blood transfusions as patient is Jehovah's Witness   . Sleep apnea    does not use CPAP sleeps on stomach  . VSD (ventricular septal defect), perimembranous    a. Dx 07/2013 with significant L-R shunt.    Patient Active Problem List   Diagnosis Date Noted  . Wellness examination 06/06/2015  . GERD (gastroesophageal reflux disease) 04/24/2015  . Sinus of Valsalva aneurysm with rupture 10/12/2013  . Diabetes mellitus, type 2 ([place]) 08/31/2013  . Pain 08/26/2013  . Patient refusal of treatment 08/25/2013  . Ventricular septal defect 08/10/2013  . Congestive heart failure ([place]) 08/10/2013  . OSA (obstructive sleep apnea) 08/05/2013  . Chronic diastolic CHF (congestive heart failure) ([place]) 08/04/2013  . VSD (ventricular septal defect) 07/23/2013  . Right ventricular dilation 07/23/2013  . Acute on chronic diastolic heart failure ([place]) 07/22/2013  . Hypertension 07/22/2013  . Hemochromatosis 09/25/2011    Past Surgical History:  Procedure Laterality Date  . CARDIAC SURGERY     Hole patched  . LEFT AND RIGHT HEART CATHETERIZATION WITH CORONARY ANGIOGRAM N/A 07/25/2013   Procedure: LEFT AND RIGHT HEART CATHETERIZATION WITH CORONARY ANGIOGRAM;  Surgeon: Jolaine Artist, MD;  Location:  [facility] CATH LAB;  Service: Cardiovascular;  Laterality: N/A;  . phlebotomy    . TEE WITHOUT CARDIOVERSION N/A 07/25/2013   Procedure: TRANSESOPHAGEAL ECHOCARDIOGRAM (TEE);  Surgeon: Dorothy Spark, MD;  Location: [facility] ENDOSCOPY;  Service: Cardiovascular;  Laterality: N/A;       Home Medications    Prior to Admission medications   Medication Sig Start Date End Date Taking? Authorizing Provider  atorvastatin (LIPITOR) 20 MG tablet Take 1 tablet (20 mg total) by mouth daily. 03/21/16  Yes Larey Dresser, MD  losartan (COZAAR) 50 MG tablet TAKE 1 TABLET (50 MG TOTAL) BY  MOUTH DAILY. 03/20/16  Yes Isaiah Serge, NP  metFORMIN (GLUCOPHAGE) 500 MG tablet Take 2 tablets (1,000 mg total) by mouth 2 (two) times daily with a meal. 05/21/16  Yes Mackie Pai, PA-C  omeprazole (PRILOSEC) 20 MG capsule Take 20 mg by mouth daily.   Yes Historical Provider, MD  sildenafil (VIAGRA) 100 MG tablet Take 1 tablet (100 mg total) by mouth daily as needed for erectile dysfunction. Patient not taking: Reported on 05/30/2016 08/21/15   Mackie Pai, PA-C  testosterone cypionate (DEPOTESTOSTERONE CYPIONATE) 200 MG/ML injection Inject 1 mL (200 mg total) into the muscle every 14 (fourteen) days. As needed now Patient not taking: Reported on 05/30/2016 02/14/16   [name], MD  triamcinolone ointment (KENALOG) 0.5 % Apply 1 application topically 2 (two) times daily. Patient not taking: Reported on 05/30/2016 03/21/16   [name], MD    Family History Family History  Problem Relation Age of Onset  . Cancer Mother   . Hypertension Father   . Congestive Heart Failure Father   . Colon cancer Paternal Aunt   . Hemochromatosis      family history    Social History Social History  Substance Use Topics  . Smoking status: Never Smoker  . Smokeless tobacco: Never Used     Comment: never used tobacco  . Alcohol use No     Allergies   Amlodipine and Lisinopril   Review of Systems Review of Systems  All other systems reviewed and are negative.    Physical Exam Updated Vital Signs BP 150/97   Pulse 86   Temp 98.1 F (36.7 C) (Oral)   Resp 18   Ht 5\' 10"  (1.778 m)   Wt 126.6 kg   SpO2 100%   BMI 40.03 kg/m   Physical Exam  Constitutional: He appears well-developed and well-nourished.  HENT:  Head: Normocephalic and atraumatic.  Mouth/Throat: Oropharynx is clear and moist.  Neck: Normal range of motion. Neck supple.  Cardiovascular: Normal rate, regular rhythm, normal heart sounds and intact distal pulses.  Exam reveals no gallop and no friction rub.     No murmur heard. Pulmonary/Chest: Effort normal and breath sounds normal.  Musculoskeletal: Normal range of motion.  Neurological: He is alert.  Skin: Skin is warm and dry.  Psychiatric: He has a normal mood and affect.  Nursing note and vitals reviewed.    ED Treatments / Results  Labs (all labs ordered are listed, but only abnormal results are displayed) Labs Reviewed  BASIC METABOLIC PANEL - Abnormal; Notable for the following:       Result Value   Sodium 134 (*)    Glucose, Bld 179 (*)    Creatinine, Ser 1.35 (*)    GFR calc non Af Amer 58 (*)    All other components within normal limits  CBC  MAGNESIUM  I-STAT TROPOININ, ED  EKG   Radiology Dg Chest 2 View  Result Date: 05/30/2016 CLINICAL DATA:  55 y/o M; tachycardia and palpitations for several days intermittently. EXAM: CHEST  2 VIEW COMPARISON:  Chest radiograph dated 07/29/2013. FINDINGS: Linear opacities in left lung base probably represent atelectasis. No pleural effusion. No pneumothorax. No pulmonary edema. Multiple sternotomy wires of which the upper wire is broken. No acute osseous abnormality. IMPRESSION: 1. Minimal left basilar atelectasis. Otherwise no acute cardiopulmonary process is evident. 2. Sternotomy wires of which the upper wire is broken. Electronically Signed   By: Kristine Garbe M.D.   On: 05/30/2016 00:20    Procedures Procedures (including critical care time)  Medications Ordered in ED Medications - No data to display   Initial Impression / Assessment and Plan / ED Course  I have reviewed the triage vital signs and the nursing notes.  Pertinent labs & imaging results that were available during my care of the patient were reviewed by me and considered in my medical decision making (see chart for details).  Clinical Course     Final Clinical Impressions(s) / ED Diagnoses   Final diagnoses:  None   Patient with a history of VSD s/p surgical repair presents today with  palpitations and chest tightness.  No ischemic changes on EKG.  Initial and delta troponin are negative.  CXR is unremarkable.  Lungs CTAB.  Feel that the patient is stable for discharge.  Instructed to follow up with Cardiology.  Return precautions given.    New Prescriptions New Prescriptions   No medications on file     Hyman Bible, PA-C 05/31/16 2258    Everlene Balls, MD 06/01/16 1538

## 2016-05-30 NOTE — ED Notes (Signed)
Pt verbalized understanding of discharge instructions and follow-up care. Denies further questions at this time. Pt ambulatory to the lobby at time of discharge.

## 2016-06-03 MED FILL — LOSARTAN POTASSIUM 50 MG TA: 50 | 30 days supply | Qty: 30 | Fill #2

## 2016-06-03 MED FILL — TESTOSTERONE CYP 200 MG/ML: 200 | 28 days supply | Qty: 2 | Fill #3

## 2016-06-03 MED FILL — ATORVASTATIN 20 MG TABLET: 20 | 30 days supply | Qty: 30 | Fill #2

## 2016-06-26 ENCOUNTER — Encounter: Payer: Self-pay | Admitting: *Deleted

## 2016-06-26 ENCOUNTER — Telehealth: Payer: Self-pay | Admitting: Medical

## 2016-06-26 ENCOUNTER — Other Ambulatory Visit (HOSPITAL_BASED_OUTPATIENT_CLINIC_OR_DEPARTMENT_OTHER): Payer: BLUE CROSS/BLUE SHIELD

## 2016-06-26 ENCOUNTER — Ambulatory Visit (HOSPITAL_BASED_OUTPATIENT_CLINIC_OR_DEPARTMENT_OTHER): Payer: BLUE CROSS/BLUE SHIELD | Admitting: Hematology & Oncology

## 2016-06-26 ENCOUNTER — Encounter: Payer: Self-pay | Admitting: Hematology & Oncology

## 2016-06-26 DIAGNOSIS — D751 Secondary polycythemia: Secondary | ICD-10-CM | POA: Diagnosis not present

## 2016-06-26 DIAGNOSIS — E291 Testicular hypofunction: Secondary | ICD-10-CM | POA: Diagnosis not present

## 2016-06-26 DIAGNOSIS — I10 Essential (primary) hypertension: Secondary | ICD-10-CM

## 2016-06-26 DIAGNOSIS — L270 Generalized skin eruption due to drugs and medicaments taken internally: Secondary | ICD-10-CM

## 2016-06-26 LAB — CBC WITH DIFFERENTIAL (CANCER CENTER ONLY)
BASO#: 0 10*3/uL (ref 0.0–0.2)
BASO%: 0.2 % (ref 0.0–2.0)
EOS ABS: 0.5 10*3/uL (ref 0.0–0.5)
EOS%: 5.1 % (ref 0.0–7.0)
HEMATOCRIT: 47.3 % (ref 38.7–49.9)
HEMOGLOBIN: 16.4 g/dL (ref 13.0–17.1)
LYMPH#: 1.8 10*3/uL (ref 0.9–3.3)
LYMPH%: 17.8 % (ref 14.0–48.0)
MCH: 31.5 pg (ref 28.0–33.4)
MCHC: 34.7 g/dL (ref 32.0–35.9)
MCV: 91 fL (ref 82–98)
MONO#: 0.8 10*3/uL (ref 0.1–0.9)
MONO%: 7.7 % (ref 0.0–13.0)
NEUT%: 69.2 % (ref 40.0–80.0)
NEUTROS ABS: 7 10*3/uL — AB (ref 1.5–6.5)
Platelets: 199 10*3/uL (ref 145–400)
RBC: 5.21 10*6/uL (ref 4.20–5.70)
RDW: 15.2 % (ref 11.1–15.7)
WBC: 10.1 10*3/uL — ABNORMAL HIGH (ref 4.0–10.0)

## 2016-06-26 LAB — COMPREHENSIVE METABOLIC PANEL
ALBUMIN: 3.9 g/dL (ref 3.5–5.0)
ALK PHOS: 65 U/L (ref 40–150)
ALT: 66 U/L — ABNORMAL HIGH (ref 0–55)
AST: 36 U/L — AB (ref 5–34)
Anion Gap: 10 mEq/L (ref 3–11)
BUN: 13.6 mg/dL (ref 7.0–26.0)
CALCIUM: 9.6 mg/dL (ref 8.4–10.4)
CO2: 28 mEq/L (ref 22–29)
Chloride: 102 mEq/L (ref 98–109)
Creatinine: 1.3 mg/dL (ref 0.7–1.3)
EGFR: 64 mL/min/{1.73_m2} — ABNORMAL LOW (ref >90)
Glucose: 229 mg/dl — ABNORMAL HIGH (ref 70–140)
POTASSIUM: 4.5 meq/L (ref 3.5–5.1)
Sodium: 140 mEq/L (ref 136–145)
Total Bilirubin: 0.74 mg/dL (ref 0.20–1.20)
Total Protein: 7.4 g/dL (ref 6.4–8.3)

## 2016-06-26 NOTE — Progress Notes (Signed)
No phlebotomy today per dr. Marin Olp. Hct 47.3 today.

## 2016-06-26 NOTE — Telephone Encounter (Signed)
Pt bp was elevated at Dr. Marin Olp. Will you call him and schedule early morning or early afternoon appointment. Avoid Thursday afternoon.  Want to recheck.

## 2016-06-26 NOTE — Progress Notes (Signed)
Hematology and Oncology Follow Up Visit  Kevin Wiggins [id number] 11-Aug-1961 55 y.o. 06/26/2016   Principle Diagnosis:  1.  Hemochromatosis (homozygous for H63D mutation). 2. Hypogonadism secondary to hemochromatosis. 3. Erythrocytosis secondary to testosterone therapy. 4. Ruptured right sinus of Valsalva aneurysm.  Current Therapy:   1. The patient status post cardiac surgery to repair the sinus of     Valsalva aneurysm. 2. Phlebotomy to keep hematocrit below 48%. 3. Testosterone 200 mg IM q.2 weeks.     Interim History:  [name] is back for followup. He looks real good. He is working out more. He seems to be quite excited about this. He is incredibly dedicated to exercise and trying to lose weight.  He is on metformin twice a day now. Hopefully, this will help his blood sugars.  He is still enjoying married life. His first year anniversary is coming up. He and his [place] will be going to the mountains.   He is still working.  He's had no cough. He's had no cardiac issues. He did have an episode of palpitations. He went to the emergency room. They really could not find any problem. I think if this happens again, that he may need to have a Holter monitor.  He still takes his testosterone. This actually is improving his quality of life.   Overall, his performance status is ECOG 0.  Medications:  Current Outpatient Prescriptions:  .  atorvastatin (LIPITOR) 20 MG tablet, Take 1 tablet (20 mg total) by mouth daily., Disp: 30 tablet, Rfl: 11 .  losartan (COZAAR) 50 MG tablet, TAKE 1 TABLET (50 MG TOTAL) BY MOUTH DAILY., Disp: 90 tablet, Rfl: 3 .  metFORMIN (GLUCOPHAGE) 500 MG tablet, Take 2 tablets (1,000 mg total) by mouth 2 (two) times daily with a meal., Disp: 120 tablet, Rfl: 3 .  omeprazole (PRILOSEC) 20 MG capsule, Take 20 mg by mouth daily., Disp: , Rfl:  .  sildenafil (VIAGRA) 100 MG tablet, Take 1 tablet (100 mg total) by mouth daily as needed for erectile dysfunction., Disp:  10 tablet, Rfl: 0 .  testosterone cypionate (DEPOTESTOSTERONE CYPIONATE) 200 MG/ML injection, Inject 1 mL (200 mg total) into the muscle every 14 (fourteen) days. As needed now, Disp: 10 mL, Rfl: 3 .  triamcinolone ointment (KENALOG) 0.5 %, Apply 1 application topically 2 (two) times daily., Disp: 30 g, Rfl: 0  Allergies:  Allergies  Allergen Reactions  . Amlodipine Other (See Comments)    Pt reports dizziness and sleepy.   . Lisinopril Other (See Comments)    cough    Past Medical History, Surgical history, Social history, and Family History were reviewed and updated.  Review of Systems: As above  Physical Exam:  height is 5\' 10"  (1.778 m) and weight is 273 lb (123.8 kg). His oral temperature is 98.6 F (37 C). His blood pressure is 162/77 (abnormal) and his pulse is 100. His respiration is 18.   Well-developed and well-nourished white gentleman. Head and neck exam shows no ocular or oral lesions. He has no conjunctival inflammation. He has no palpable cervical or supraclavicular lymph nodes. Lungs are clear. Cardiac exam regular rate and rhythm with no murmurs rubs or bruits. Abdomen is soft. He is moderately obese. There is no fluid wave. There is no palpable liver or spleen tip. Back exam shows no tenderness over the spine ribs or hips. Extremities shows no clubbing cyanosis or edema. Neurological exam shows no focal neurological deficits. Skin exam a macular type rash  on his ankles. He also has areas of macular rash with some papular areas on his inner thigh. No blisters are noted. .   Lab Results  Component Value Date   WBC 10.1 (H) 06/26/2016   HGB 16.4 06/26/2016   HCT 47.3 06/26/2016   MCV 91 06/26/2016   PLT 199 06/26/2016     Chemistry      Component Value Date/Time   NA 140 06/26/2016 1346   K 4.5 06/26/2016 1346   CL 101 05/29/2016 2347   CL 99 02/07/2016 1449   CL 98 12/24/2015 1343   CO2 28 06/26/2016 1346   BUN 13.6 06/26/2016 1346   CREATININE 1.3 06/26/2016  1346      Component Value Date/Time   CALCIUM 9.6 06/26/2016 1346   ALKPHOS 65 06/26/2016 1346   AST 36 (H) 06/26/2016 1346   ALT 66 (H) 06/26/2016 1346   BILITOT 0.74 06/26/2016 1346         Impression and Plan: Kevin Wiggins is 55 year old white male.[name] He has a homozygous mutation for one of the minor genes for hemochromatosis.    For now, we do not have to phlebotomize him. The hematocrit is below 48% for his hematocrit. This is outstanding.  I think we've probably get him back in about 2 months. I think this would be reasonable.  I know that he will have a wonderful first anniversary in the mountains. I know he is looking for to this.   [name], MD 9/21/20174:59 PM

## 2016-06-27 LAB — IRON AND TIBC
%SAT: 21 % (ref 20–55)
IRON: 68 ug/dL (ref 42–163)
TIBC: 328 ug/dL (ref 202–409)
UIBC: 259 ug/dL (ref 117–376)

## 2016-06-27 LAB — FERRITIN: FERRITIN: 19 ng/mL — AB (ref 22–316)

## 2016-06-27 NOTE — Telephone Encounter (Signed)
Patient scheduled for follow up BP appointment on Tuesday 07/01/16.

## 2016-07-01 ENCOUNTER — Ambulatory Visit (INDEPENDENT_AMBULATORY_CARE_PROVIDER_SITE_OTHER): Payer: BLUE CROSS/BLUE SHIELD | Admitting: Medical

## 2016-07-01 ENCOUNTER — Encounter: Payer: Self-pay | Admitting: Medical

## 2016-07-01 VITALS — BP 135/80 | HR 87 | Temp 98.3°F | Ht 70.0 in | Wt 280.8 lb

## 2016-07-01 DIAGNOSIS — I10 Essential (primary) hypertension: Secondary | ICD-10-CM | POA: Diagnosis not present

## 2016-07-01 DIAGNOSIS — M25562 Pain in left knee: Secondary | ICD-10-CM | POA: Diagnosis not present

## 2016-07-01 NOTE — Progress Notes (Signed)
Subjective:    Patient ID: Kevin Wiggins, male    DOB: 11-15-1960, 55 y.o.   MRN: [id number]  HPI   Pt in for follow up. Pt had elevated systolic when she was with Dr. Marin Olp.  Pt states he doubts accuracy of the the blood pressure levels since checked by machine. No cardiac or neurologic signs or symptoms  Pt helped his son move the other day. Slight rt knee pain after helping him move his son. Pt has known lt knee injury. Slight tear meniscus.   Pt a1-c. Last one done a month ago ago and was 8.0. This was better than in the past. Pt has been making improvements.    Review of Systems  Constitutional: Negative for chills, fatigue and fever.  HENT: Negative for congestion, sinus pressure, trouble swallowing and voice change.   Respiratory: Negative for cough, chest tightness, shortness of breath and wheezing.   Cardiovascular: Negative for chest pain and palpitations.  Gastrointestinal: Negative for abdominal pain.  Musculoskeletal: Negative for back pain, gait problem, joint swelling and myalgias.       Lt knee pain.  Skin: Negative for rash.  Neurological: Negative for dizziness and headaches.  Hematological: Negative for adenopathy. Does not bruise/bleed easily.  Psychiatric/Behavioral: Negative for behavioral problems, confusion, sleep disturbance and suicidal ideas.    Past Medical History:  Diagnosis Date  . Borderline diabetic    a. A1C 6.6 in 07/2013 - needs to f/u PCP for likely full DM.  [name] CAD (coronary artery disease)    a. 07/2013: mild nonobstructive CAD by cath 07/25/13 as part of eval for VSD/SOB.  . CKD (chronic kidney disease)   . Diabetes mellitus without complication ([place])   . Diastolic CHF ([place])    a. Acute diastolic CHF 99991111 in the setting of perimembranous VSD. Eval underway given mod pulm HTN and RV dilation.  . ED (erectile dysfunction)   . Erythrocytosis    secondary to testosterone replacement  . GERD (gastroesophageal reflux disease)   . H/O:  hypothyroidism   . Heart murmur   . Hemochromatosis    H63D homozyous mutation  . Hyperlipidemia   . Hypertension   . Hypogonadism male   . LAFB (left anterior fascicular block)   . Leukocytosis    secondary to testosterone replacement  . Pulmonary HTN ([place])    a. 07/2013: Moderate pulmonary HTN with RV dilitation.  . RBBB   . Refusal of blood transfusions as patient is Jehovah's Witness   . Sleep apnea    does not use CPAP sleeps on stomach  . VSD (ventricular septal defect), perimembranous    a. Dx 07/2013 with significant L-R shunt.     Social History   Social History  . Marital status: Married    Spouse name: N/A  . Number of children: N/A  . Years of education: N/A   Occupational History  .  [employer]    Maintenance    Social History Main Topics  . Smoking status: Never Smoker  . Smokeless tobacco: Never Used     Comment: never used tobacco  . Alcohol use No  . Drug use: No  . Sexual activity: Not on file   Other Topics Concern  . Not on file   Social History Narrative  . No narrative on file    Past Surgical History:  Procedure Laterality Date  . CARDIAC SURGERY     Hole patched  . LEFT AND RIGHT HEART CATHETERIZATION WITH CORONARY ANGIOGRAM  N/A 07/25/2013   Procedure: LEFT AND RIGHT HEART CATHETERIZATION WITH CORONARY ANGIOGRAM;  Surgeon: Jolaine Artist, MD;  Location[facility] CATH LAB;  Service: Cardiovascular;  Laterality: N/A;  . phlebotomy    . TEE WITHOUT CARDIOVERSION N/A 07/25/2013   Procedure: TRANSESOPHAGEAL ECHOCARDIOGRAM (TEE);  Surgeon: Dorothy Spark, MD;  Location: [facility] ENDOSCOPY;  Service: Cardiovascular;  Laterality: N/A;    Family History  Problem Relation Age of Onset  . Cancer Mother   . Hypertension Father   . Congestive Heart Failure Father   . Colon cancer Paternal Aunt   . Hemochromatosis      family history    Allergies  Allergen Reactions  . Amlodipine Other (See Comments)    Pt reports dizziness and sleepy.    . Lisinopril Other (See Comments)    cough    Current Outpatient Prescriptions on File Prior to Visit  Medication Sig Dispense Refill  . atorvastatin (LIPITOR) 20 MG tablet Take 1 tablet (20 mg total) by mouth daily. 30 tablet 11  . losartan (COZAAR) 50 MG tablet TAKE 1 TABLET (50 MG TOTAL) BY MOUTH DAILY. 90 tablet 3  . metFORMIN (GLUCOPHAGE) 500 MG tablet Take 2 tablets (1,000 mg total) by mouth 2 (two) times daily with a meal. 120 tablet 3  . omeprazole (PRILOSEC) 20 MG capsule Take 20 mg by mouth daily.    . sildenafil (VIAGRA) 100 MG tablet Take 1 tablet (100 mg total) by mouth daily as needed for erectile dysfunction. 10 tablet 0  . testosterone cypionate (DEPOTESTOSTERONE CYPIONATE) 200 MG/ML injection Inject 1 mL (200 mg total) into the muscle every 14 (fourteen) days. As needed now 10 mL 3  . triamcinolone ointment (KENALOG) 0.5 % Apply 1 application topically 2 (two) times daily. 30 g 0   No current facility-administered medications on file prior to visit.     BP 140/90   Pulse 87   Temp 98.3 F (36.8 C) (Oral)   Ht 5\' 10"  (1.778 m)   Wt 280 lb 12.8 oz (127.4 kg)   SpO2 98%   BMI 40.29 kg/m      Objective:   Physical Exam  General Mental Status- Alert. General Appearance- Not in acute distress.   Skin General: Color- Normal Color. Moisture- Normal Moisture.   Chest and Lung Exam Auscultation: Breath Sounds:-Normal.  Cardiovascular Auscultation:Rythm- Regular. Murmurs & Other Heart Sounds:Auscultation of the heart reveals- No Murmurs.  Abdomen Inspection:-Inspeection Normal. Palpation/Percussion:Note:No mass. Palpation and Percussion of the abdomen reveal- Non Tender, Non Distended + BS, no rebound or guarding.   Lt knee- anterior aspect faint tenderness over tibial plateau. But no calf edema. Neg homans signs.  Neurologic Cranial Nerve exam:- CN III-XII intact(No nystagmus), symmetric smile. Strength:- 5/5 equal and symmetric strength both upper and  lower extremities.     Assessment & Plan:  Your htn is better than the other day at Dr. Marin Olp. So continue the losartan 50 mg a day. In future if bp elevated could increase losartan to 100 mg a day.  For knee pain if worsens could use alleve otc if needed.  Follow up in mid November and will recheck you a1-c and you lipid panel.  Reanna Scoggin, Percell Miller, PA-C

## 2016-07-01 NOTE — Progress Notes (Signed)
Pre visit review using our clinic tool,if applicable. No additional management support is needed unless otherwise documented below in the visit note.  

## 2016-07-01 NOTE — Patient Instructions (Addendum)
Your htn is better than the other day at Dr. Marin Olp. So continue the losartan 50 mg a day. In future if bp elevated  could increase losartan to 100 mg a day.  For knee pain if worsens could use alleve otc if needed.  Follow up in mid November and will recheck you a1-c and you lipid panel.

## 2016-07-04 MED FILL — ATORVASTATIN 20 MG TABLET: 20 | 30 days supply | Qty: 30 | Fill #3

## 2016-07-04 MED FILL — LOSARTAN POTASSIUM 50 MG TA: 50 | 30 days supply | Qty: 30 | Fill #3

## 2016-07-23 MED FILL — metFORMIN HCL 500 MG TABS: 500 | 30 days supply | Qty: 120 | Fill #1

## 2016-07-24 ENCOUNTER — Telehealth: Payer: Self-pay

## 2016-07-24 MED FILL — TESTOSTERONE CYP 200 MG/ML: 200 | 28 days supply | Qty: 2 | Fill #4

## 2016-07-24 NOTE — Telephone Encounter (Signed)
Approval received from [employer] for pt's Sildenafil from 06/24/2016 through 07/24/2017. Approval ID #: [id number]. dph

## 2016-08-01 MED FILL — ATORVASTATIN 20 MG TABLET: 20 | 30 days supply | Qty: 30 | Fill #4

## 2016-08-11 MED FILL — LOSARTAN POTASSIUM 50 MG TA: 50 | 30 days supply | Qty: 30 | Fill #4

## 2016-08-15 ENCOUNTER — Ambulatory Visit: Payer: BLUE CROSS/BLUE SHIELD | Admitting: Medical

## 2016-08-22 MED FILL — metFORMIN HCL 500 MG TABS: 500 | 30 days supply | Qty: 120 | Fill #2

## 2016-08-25 ENCOUNTER — Ambulatory Visit (INDEPENDENT_AMBULATORY_CARE_PROVIDER_SITE_OTHER): Payer: BLUE CROSS/BLUE SHIELD | Admitting: Medical

## 2016-08-25 ENCOUNTER — Encounter: Payer: Self-pay | Admitting: Medical

## 2016-08-25 VITALS — BP 128/86 | HR 87 | Temp 98.0°F | Ht 70.0 in | Wt 280.4 lb

## 2016-08-25 DIAGNOSIS — N50819 Testicular pain, unspecified: Secondary | ICD-10-CM

## 2016-08-25 DIAGNOSIS — M25562 Pain in left knee: Secondary | ICD-10-CM

## 2016-08-25 DIAGNOSIS — G8929 Other chronic pain: Secondary | ICD-10-CM

## 2016-08-25 DIAGNOSIS — N451 Epididymitis: Secondary | ICD-10-CM | POA: Diagnosis not present

## 2016-08-25 MED ORDER — CIPROFLOXACIN HCL 500 MG PO TABS: 500.0000 mg | ORAL_TABLET | Freq: Two times a day (BID) | ORAL | 0 refills | Status: DC

## 2016-08-25 MED ORDER — DICLOFENAC SODIUM 75 MG PO TBEC: 75.0000 mg | DELAYED_RELEASE_TABLET | Freq: Two times a day (BID) | ORAL | 0 refills | Status: DC

## 2016-08-25 MED ORDER — CEFTRIAXONE SODIUM 1 G IJ SOLR
1.0000 g | Freq: Once | INTRAMUSCULAR | Status: AC
  Administered 2016-08-25: 1 g via INTRAMUSCULAR

## 2016-08-25 MED ORDER — KETOROLAC TROMETHAMINE 60 MG/2ML IM SOLN
60.0000 mg | Freq: Once | INTRAMUSCULAR | Status: AC
  Administered 2016-08-25: 60 mg via INTRAMUSCULAR

## 2016-08-25 MED FILL — DICLOFENAC SOD 75 MG TAB EC: 75 | 10 days supply | Qty: 20 | Fill #0

## 2016-08-25 MED FILL — CIPROFLOXACIN HCL 500 MG TA: 500 | 7 days supply | Qty: 14 | Fill #0

## 2016-08-25 NOTE — Patient Instructions (Addendum)
You do appear to have  epididymitis by on exam. Will give rocephin 1 gram IM. Will rx cipro antibiotic to start as well. You expressed that you don't want US of the scrotum. If your pain does not improve then would recommend urine ancillary and [place] of scrotum. Any severe pain testicle after hours then ED evaluation.  For your knee pain will give toradol 60 mg im.  Start diclofenac tomorrow afternoon.  If your knee pain persists will get xray. Future order xray placed.  Follow up in 10 days or as needed  With pt above complaints and severity we agreed he would reschedule his CPE

## 2016-08-25 NOTE — Progress Notes (Signed)
Pre visit review using our clinic review tool, if applicable. No additional management support is needed unless otherwise documented below in the visit note. 

## 2016-08-25 NOTE — Progress Notes (Signed)
Subjective:    Patient ID: Kevin Wiggins, male    DOB: 1960-10-25, 55 y.o.   MRN: [id number]  HPI    Pt in stating both testicles are hurting him. He states has started to wear tighter pants at work(requirement for his company). Both testicles hurt equally. Pain started for about one week. No discharge from penis. No fevers or sweats.No low back or perineum pain.   Pt states in past had testicle infection. Unclear what was cause. He mentions symptoms occurred after having sex with his wife.  Pt indicates he does not want scrotal ultrasound. Pain for about 4 days. Pain level moderate for 4 days. Pt not urinating more frequent.   Left knee pain as well. Pt states he got steroid injection in past wih his ortho. Pt has known meniscus tear. Pt states could treat without surgery. Area over meniscus feels fine. Then 3 weeks ago he hit his knee over medial aspect. Since then hurts on and off .     Review of Systems  Constitutional: Negative for chills, fatigue and fever.  Respiratory: Negative for cough, chest tightness, shortness of breath and wheezing.   Cardiovascular: Negative for chest pain and palpitations.  Gastrointestinal: Negative for abdominal distention, abdominal pain and blood in stool.  Genitourinary: Positive for testicular pain. Negative for decreased urine volume, difficulty urinating, discharge, dysuria, frequency, hematuria, penile pain, penile swelling, scrotal swelling and urgency.  Musculoskeletal: Negative for back pain.       Left knee pain.  Skin: Negative for rash.  Neurological: Negative for dizziness, seizures, syncope, speech difficulty, weakness, numbness and headaches.  Hematological: Negative for adenopathy. Does not bruise/bleed easily.  Psychiatric/Behavioral: Negative for confusion.    Past Medical History:  Diagnosis Date  . Borderline diabetic    a. A1C 6.6 in 07/2013 - needs to f/u PCP for likely full DM.  [name] CAD (coronary artery disease)    a.  07/2013: mild nonobstructive CAD by cath 07/25/13 as part of eval for VSD/SOB.  . CKD (chronic kidney disease)   . Diabetes mellitus without complication ([place])   . Diastolic CHF ([place])    a. Acute diastolic CHF 99991111 in the setting of perimembranous VSD. Eval underway given mod pulm HTN and RV dilation.  . ED (erectile dysfunction)   . Erythrocytosis    secondary to testosterone replacement  . GERD (gastroesophageal reflux disease)   . H/O: hypothyroidism   . Heart murmur   . Hemochromatosis    H63D homozyous mutation  . Hyperlipidemia   . Hypertension   . Hypogonadism male   . LAFB (left anterior fascicular block)   . Leukocytosis    secondary to testosterone replacement  . Pulmonary HTN    a. 07/2013: Moderate pulmonary HTN with RV dilitation.  . RBBB   . Refusal of blood transfusions as patient is Jehovah's Witness   . Sleep apnea    does not use CPAP sleeps on stomach  . VSD (ventricular septal defect), perimembranous    a. Dx 07/2013 with significant L-R shunt.     Social History   Social History  . Marital status: Married    Spouse name: N/A  . Number of children: N/A  . Years of education: N/A   Occupational History  .  [employer]    Maintenance    Social History Main Topics  . Smoking status: Never Smoker  . Smokeless tobacco: Never Used     Comment: never used tobacco  . Alcohol use  No  . Drug use: No  . Sexual activity: Not on file   Other Topics Concern  . Not on file   Social History Narrative  . No narrative on file    Past Surgical History:  Procedure Laterality Date  . CARDIAC SURGERY     H[place] patched  . LEFT AND RIGHT HEART CATHETERIZATION WITH CORONARY ANGIOGRAM N/A 07/25/2013   Procedure: LEFT AND RIGHT HEART CATHETERIZATION WITH CORONARY ANGIOGRAM;  Surgeon: Jolaine Artist, MD;  [facility] C[facility] LAB;  Service: Cardiovascular;  Laterality: N/A;  . phlebotomy    . TEE WITHOUT CARDIOVERSION N/A 07/25/2013   Procedure:  TRANSESOPHAGEAL ECHOCARDIOGRAM (TEE);  Surgeon: Dorothy Spark, MD;  Location: [facility] ENDOSCOPY;  Service: Cardiovascular;  Laterality: N/A;    Family History  Problem Relation Age of Onset  . Cancer Mother   . Hypertension Father   . Congestive Heart Failure Father   . Colon cancer Paternal Aunt   . Hemochromatosis      family history    Allergies  Allergen Reactions  . Amlodipine Other (See Comments)    Pt reports dizziness and sleepy.   . Lisinopril Other (See Comments)    cough    Current Outpatient Prescriptions on File Prior to Visit  Medication Sig Dispense Refill  . atorvastatin (LIPITOR) 20 MG tablet Take 1 tablet (20 mg total) by mouth daily. 30 tablet 11  . losartan (COZAAR) 50 MG tablet TAKE 1 TABLET (50 MG TOTAL) BY MOUTH DAILY. 90 tablet 3  . metFORMIN (GLUCOPHAGE) 500 MG tablet Take 2 tablets (1,000 mg total) by mouth 2 (two) times daily with a meal. 120 tablet 3  . omeprazole (PRILOSEC) 20 MG capsule Take 20 mg by mouth daily.    . sildenafil (VIAGRA) 100 MG tablet Take 1 tablet (100 mg total) by mouth daily as needed for erectile dysfunction. 10 tablet 0  . testosterone cypionate (DEPOTESTOSTERONE CYPIONATE) 200 MG/ML injection Inject 1 mL (200 mg total) into the muscle every 14 (fourteen) days. As needed now 10 mL 3  . triamcinolone ointment (KENALOG) 0.5 % Apply 1 application topically 2 (two) times daily. 30 g 0   No current facility-administered medications on file prior to visit.     BP 128/86 (BP Location: Left Arm, Patient Position: Sitting, Cuff Size: Large)   Pulse 87   Temp 98 F (36.7 C) (Oral)   Ht 5\' 10"  (1.778 m)   Wt 280 lb 6.4 oz (127.2 kg)   SpO2 98%   BMI 40.23 kg/m       Objective:   Physical Exam  General- No acute distress. Pleasant patient. Neck- Full range of motion, no jvd Lungs- Clear, even and unlabored. Heart- regular rate and rhythm. Neurologic- CNII- XII grossly intact.  Left knee- tender to palpation. Medial aspect  of knee. Not tender of tibial plateau.  Genital exam-  Pt has tenderness to both epididymus area both sides(both sides mild inflamed). No masses. Testicles not enlarged.      Assessment & Plan:  You do appear to have  epididymitis by on exam. Will give rocephin 1 gram IM. Will rx cipro antibiotic to start as well. You expressed that you don't want US of the scrotum. If your pain does not improve then would recommend urine ancillary and [place] of scrotum. Any severe high level pain then ED evaluation. Explained to pt need for [place] in event severe constant pain. He expressed understanding.  For your knee pain will give toradol 60  mg im.  Start diclofenac tomorrow afternoon.  If your knee pain persists will get xray. Future order xray placed.  Follow up in 10 days or as needed

## 2016-09-01 ENCOUNTER — Telehealth: Payer: Self-pay | Admitting: Medical

## 2016-09-01 DIAGNOSIS — N451 Epididymitis: Secondary | ICD-10-CM

## 2016-09-01 DIAGNOSIS — N50819 Testicular pain, unspecified: Secondary | ICD-10-CM

## 2016-09-01 MED ORDER — SULFAMETHOXAZOLE-TRIMETHOPRIM 800-160 MG PO TABS: 1.0000 | ORAL_TABLET | Freq: Two times a day (BID) | ORAL | 0 refills | Status: DC

## 2016-09-01 NOTE — Telephone Encounter (Signed)
rx bactrim ds sent in. Stop cipro.

## 2016-09-01 NOTE — Telephone Encounter (Signed)
Please advise 

## 2016-09-01 NOTE — Telephone Encounter (Signed)
Caller name: Dequavion Relationship to patient: self Can be reached: [phone] Pharmacy: [facility]  Reason for call: Pt was taking cipro but it made his chest feel tight. He stopped taking 3 days ago and the chest tightness stopped. Pt having sx of infection again/still and asking for alternate med.

## 2016-09-01 NOTE — Telephone Encounter (Signed)
Pt had testicle pain and treated for epididymitis. He states after 3 days he had to stop due to chest pain. This needs clarification since cipro typically does not cause chest pain and pt doe have some heart history. Hx of heart surgery in past and some cardiac risk factors so we need to make sure no current chest pain. If he does have chest pain when you call then ED evaluation.  Stop the cipro. Can send in bactrim DS.   But I need to know how his testicles feel. Does he have same degree of pain.   If so I would recommend urine ancillary studies, urine culture, psa, and scrotal ultrasound. He declined ultrasound the other day but if he has same level pain would recommend [place].   If he declined [place] at least have him come in for unine ancillary studies urine culture and psa. Would include usual gonorrhea, chlamydia and trichomonas on ancillary studies.(although doubt test will be positive will go ahead and do the work up since he had to stop cipro mid course).  If he agrees to come in coordinate with lab on test and I will sign the orders.

## 2016-09-02 MED FILL — SULFAMETHOXAZOLE-TMP DS TAB: 800-160 | 10 days supply | Qty: 20 | Fill #0

## 2016-09-02 MED FILL — ATORVASTATIN 20 MG TABLET: 20 | 30 days supply | Qty: 30 | Fill #5

## 2016-09-02 NOTE — Telephone Encounter (Signed)
Called to follow with patient. Pt states he is no longer experience chest tightness.  States he never experience pain, it was more like pressure, but once he stopped taking the cipro the pressure went away.  He says with taking the medication, it also helped with his testicular pain.  However, now that he has stopped taking the medication, the pain is starting to come back.  He has an appt with Dr. Marin Olp tomorrow and plans to stop by the lab for lab work prior to his appt.  Lab appt scheduled.  He continues to decline the ultrasound, stating he does not see the need for it since the medication helped with the pain.    Pt was advised to pick up bactrim from pharmacy and take as prescribed.  He was also advised if chest pain/tightness returns to go to the ER.  He stated understanding and agreed.

## 2016-09-03 ENCOUNTER — Ambulatory Visit (HOSPITAL_BASED_OUTPATIENT_CLINIC_OR_DEPARTMENT_OTHER): Payer: BLUE CROSS/BLUE SHIELD | Admitting: Hematology & Oncology

## 2016-09-03 ENCOUNTER — Other Ambulatory Visit (INDEPENDENT_AMBULATORY_CARE_PROVIDER_SITE_OTHER): Payer: BLUE CROSS/BLUE SHIELD

## 2016-09-03 ENCOUNTER — Other Ambulatory Visit (HOSPITAL_BASED_OUTPATIENT_CLINIC_OR_DEPARTMENT_OTHER): Payer: BLUE CROSS/BLUE SHIELD

## 2016-09-03 ENCOUNTER — Other Ambulatory Visit (HOSPITAL_COMMUNITY)
Admission: RE | Admit: 2016-09-03 | Discharge: 2016-09-03 | Disposition: A | Discharge status: Home / Self Care | Payer: BLUE CROSS/BLUE SHIELD | Source: Ambulatory Visit | Attending: Medical | Admitting: Medical

## 2016-09-03 DIAGNOSIS — E291 Testicular hypofunction: Secondary | ICD-10-CM | POA: Diagnosis not present

## 2016-09-03 DIAGNOSIS — Z113 Encounter for screening for infections with a predominantly sexual mode of transmission: Secondary | ICD-10-CM | POA: Diagnosis not present

## 2016-09-03 DIAGNOSIS — I10 Essential (primary) hypertension: Secondary | ICD-10-CM

## 2016-09-03 DIAGNOSIS — N50819 Testicular pain, unspecified: Secondary | ICD-10-CM | POA: Diagnosis not present

## 2016-09-03 DIAGNOSIS — N451 Epididymitis: Secondary | ICD-10-CM

## 2016-09-03 DIAGNOSIS — D751 Secondary polycythemia: Secondary | ICD-10-CM

## 2016-09-03 LAB — CBC WITH DIFFERENTIAL (CANCER CENTER ONLY)
BASO#: 0 10*3/uL (ref 0.0–0.2)
BASO%: 0.4 % (ref 0.0–2.0)
EOS ABS: 0.4 10*3/uL (ref 0.0–0.5)
EOS%: 4.3 % (ref 0.0–7.0)
HEMATOCRIT: 45.9 % (ref 38.7–49.9)
HEMOGLOBIN: 16.3 g/dL (ref 13.0–17.1)
LYMPH#: 1.7 10*3/uL (ref 0.9–3.3)
LYMPH%: 16.1 % (ref 14.0–48.0)
MCH: 31.5 pg (ref 28.0–33.4)
MCHC: 35.5 g/dL (ref 32.0–35.9)
MCV: 89 fL (ref 82–98)
MONO#: 0.9 10*3/uL (ref 0.1–0.9)
MONO%: 8.3 % (ref 0.0–13.0)
NEUT#: 7.3 10*3/uL — ABNORMAL HIGH (ref 1.5–6.5)
NEUT%: 70.9 % (ref 40.0–80.0)
PLATELETS: 219 10*3/uL (ref 145–400)
RBC: 5.17 10*6/uL (ref 4.20–5.70)
RDW: 13.9 % (ref 11.1–15.7)
WBC: 10.3 10*3/uL — AB (ref 4.0–10.0)

## 2016-09-03 LAB — CMP (CANCER CENTER ONLY)
ALBUMIN: 4.1 g/dL (ref 3.3–5.5)
ALT(SGPT): 75 U/L — ABNORMAL HIGH (ref 10–47)
AST: 39 U/L — ABNORMAL HIGH (ref 11–38)
Alkaline Phosphatase: 56 U/L (ref 26–84)
BUN, Bld: 19 mg/dL (ref 7–22)
CHLORIDE: 99 meq/L (ref 98–108)
CO2: 24 mEq/L (ref 18–33)
Calcium: 9.6 mg/dL (ref 8.0–10.3)
Creat: 1.3 mg/dl — ABNORMAL HIGH (ref 0.6–1.2)
Glucose, Bld: 203 mg/dL — ABNORMAL HIGH (ref 73–118)
POTASSIUM: 4.3 meq/L (ref 3.3–4.7)
Sodium: 140 mEq/L (ref 128–145)
TOTAL PROTEIN: 7.5 g/dL (ref 6.4–8.1)
Total Bilirubin: 0.9 mg/dl (ref 0.20–1.60)

## 2016-09-03 LAB — PSA: PSA: 0.51 ng/mL (ref 0.10–4.00)

## 2016-09-03 NOTE — Progress Notes (Signed)
Hematology and Oncology Follow Up Visit  Kevin Wiggins [id number] [birth year] 55 y.o. 09/03/2016   Principle Diagnosis:  1.  Hemochromatosis (homozygous for H63D mutation). 2. Hypogonadism secondary to hemochromatosis. 3. Erythrocytosis secondary to testosterone therapy. 4. Ruptured right sinus of Valsalva aneurysm.  Current Therapy:   1. The patient status post cardiac surgery to repair the sinus of     Valsalva aneurysm. 2. Phlebotomy to keep hematocrit below 48%. 3. Testosterone 200 mg IM q.2 weeks.     Interim History:  Mr.  Kevin Wiggins is back for followup. He had a very good first anniversary. He does wife went out to dinner and had a quiet night.  He had a wonderful Thanksgiving. As always, he was busy smoking and frying a lot of meat. He really enjoyed himself.  He is working. He is quite busy at work.  We've done well with his hemochromatosis. This really has not been an issue for him. His last ferritin back in September was only 19. His iron saturation was 21%.   He is still taking testosterone. This just helps his quality of life.   He's had no cardiac issues. I'm unsure if he even has to go back to [facility] to see the cardiothoracic surgeon.   His blood sugars are another problem. His blood glucose typically is over 200 whenever we see him. I'm sure his family doctor will manage this for him.   Overall, his performance status is ECOG 0.  Medications:  Current Outpatient Prescriptions:  .  atorvastatin (LIPITOR) 20 MG tablet, Take 1 tablet (20 mg total) by mouth daily., Disp: 30 tablet, Rfl: 11 .  diclofenac (VOLTAREN) 75 MG EC tablet, Take 1 tablet (75 mg total) by mouth 2 (two) times daily., Disp: 20 tablet, Rfl: 0 .  losartan (COZAAR) 50 MG tablet, TAKE 1 TABLET (50 MG TOTAL) BY MOUTH DAILY., Disp: 90 tablet, Rfl: 3 .  metFORMIN (GLUCOPHAGE) 500 MG tablet, Take 2 tablets (1,000 mg total) by mouth 2 (two) times daily with a meal., Disp: 120 tablet, Rfl: 3 .  omeprazole  (PRILOSEC) 20 MG capsule, Take 20 mg by mouth daily., Disp: , Rfl:  .  sildenafil (VIAGRA) 100 MG tablet, Take 1 tablet (100 mg total) by mouth daily as needed for erectile dysfunction., Disp: 10 tablet, Rfl: 0 .  sulfamethoxazole-trimethoprim (BACTRIM DS,SEPTRA DS) 800-160 MG tablet, Take 1 tablet by mouth 2 (two) times daily., Disp: 20 tablet, Rfl: 0 .  testosterone cypionate (DEPOTESTOSTERONE CYPIONATE) 200 MG/ML injection, Inject 1 mL (200 mg total) into the muscle every 14 (fourteen) days. As needed now, Disp: 10 mL, Rfl: 3 .  triamcinolone ointment (KENALOG) 0.5 %, Apply 1 application topically 2 (two) times daily., Disp: 30 g, Rfl: 0  Allergies:  Allergies  Allergen Reactions  . Amlodipine Other (See Comments)    Pt reports dizziness and sleepy.   . Lisinopril Other (See Comments)    cough    Past Medical History, Surgical history, Social history, and Family History were reviewed and updated.  Review of Systems: As above  Physical Exam:  weight is 279 lb (126.6 kg). His oral temperature is 97.6 F (36.4 C). His blood pressure is 153/79 (abnormal) and his pulse is 93. His respiration is 16.   Well-developed and well-nourished white gentleman. Head and neck exam shows no ocular or oral lesions. He has no conjunctival inflammation. He has no palpable cervical or supraclavicular lymph nodes. Lungs are clear. Cardiac exam regular rate and rhythm with no  murmurs rubs or bruits. Abdomen is soft. He is moderately obese. There is no fluid wave. There is no palpable liver or spleen tip. Back exam shows no tenderness over the spine ribs or hips. Extremities shows no clubbing cyanosis or edema. Neurological exam shows no focal neurological deficits. Skin exam a macular type rash on his ankles. He also has areas of macular rash with some papular areas on his inner thigh. No blisters are noted. .   Lab Results  Component Value Date   WBC 10.3 (H) 09/03/2016   HGB 16.3 09/03/2016   HCT 45.9  09/03/2016   MCV 89 09/03/2016   PLT 219 09/03/2016     Chemistry      Component Value Date/Time   NA 140 09/03/2016 1432   NA 140 06/26/2016 1346   K 4.3 09/03/2016 1432   K 4.5 06/26/2016 1346   CL 99 09/03/2016 1432   CO2 24 09/03/2016 1432   CO2 28 06/26/2016 1346   BUN 19 09/03/2016 1432   BUN 13.6 06/26/2016 1346   CREATININE 1.3 (H) 09/03/2016 1432   CREATININE 1.3 06/26/2016 1346      Component Value Date/Time   CALCIUM 9.6 09/03/2016 1432   CALCIUM 9.6 06/26/2016 1346   ALKPHOS 56 09/03/2016 1432   ALKPHOS 65 06/26/2016 1346   AST 39 (H) 09/03/2016 1432   AST 36 (H) 06/26/2016 1346   ALT 75 (H) 09/03/2016 1432   ALT 66 (H) 06/26/2016 1346   BILITOT 0.90 09/03/2016 1432   BILITOT 0.74 06/26/2016 1346         Impression and Plan: Kevin Wiggins is 55 year old white male.[name] He has a homozygous mutation for one of the minor genes for hemochromatosis.    For now, we do not have to phlebotomize him. The hematocrit is below 48%. This is outstanding.  I think we've probably get him back in about 3 months. I think this would be reasonable.  I really think that in the long-term, his problem will be diabetes. His blood sugars just are quite high. His LFTs are a little elevated which I think probably reflect some hepatic steatosis.  I know that he will have a wonderful Christmas and New Year's.   [name], MD 11/29/20175:03 PM

## 2016-09-04 LAB — IRON AND TIBC
%SAT: 24 % (ref 20–55)
IRON: 78 ug/dL (ref 42–163)
TIBC: 320 ug/dL (ref 202–409)
UIBC: 242 ug/dL (ref 117–376)

## 2016-09-04 LAB — URINE CYTOLOGY ANCILLARY ONLY
Chlamydia: NEGATIVE
Neisseria Gonorrhea: NEGATIVE
Trichomonas: NEGATIVE

## 2016-09-04 LAB — URINE CULTURE: Organism ID, Bacteria: NO GROWTH

## 2016-09-04 LAB — FERRITIN: FERRITIN: 42 ng/mL (ref 22–316)

## 2016-09-04 LAB — TESTOSTERONE: Testosterone, Serum: 307 ng/dL (ref 264–916)

## 2016-09-05 ENCOUNTER — Telehealth: Payer: Self-pay | Admitting: Medical

## 2016-09-05 NOTE — Telephone Encounter (Signed)
Relation to [id number] Call back [place]:  Reason for call:  Patient states he's returning call regarding lab results, please advise

## 2016-09-05 NOTE — Telephone Encounter (Signed)
Pt notified of instructions and verbalized understanding. 

## 2016-09-09 ENCOUNTER — Other Ambulatory Visit: Payer: Self-pay | Admitting: *Deleted

## 2016-09-09 DIAGNOSIS — Q210 Ventricular septal defect: Secondary | ICD-10-CM

## 2016-09-09 DIAGNOSIS — N529 Male erectile dysfunction, unspecified: Secondary | ICD-10-CM

## 2016-09-09 DIAGNOSIS — I5032 Chronic diastolic (congestive) heart failure: Secondary | ICD-10-CM

## 2016-09-09 DIAGNOSIS — E349 Endocrine disorder, unspecified: Secondary | ICD-10-CM

## 2016-09-09 MED ORDER — TESTOSTERONE CYPIONATE 200 MG/ML IM SOLN: 200.0000 mg | INTRAMUSCULAR | 3 refills | Status: AC

## 2016-09-09 MED FILL — TESTOSTERONE CYP 200 MG/ML: 200 | 28 days supply | Qty: 2 | Fill #0

## 2016-09-09 MED FILL — LOSARTAN POTASSIUM 50 MG TA: 50 | 30 days supply | Qty: 30 | Fill #5

## 2016-09-22 MED FILL — metFORMIN HCL 500 MG TABS: 500 | 30 days supply | Qty: 120 | Fill #3

## 2016-10-01 ENCOUNTER — Encounter: Payer: Self-pay | Admitting: Medical

## 2016-10-01 ENCOUNTER — Ambulatory Visit (INDEPENDENT_AMBULATORY_CARE_PROVIDER_SITE_OTHER): Payer: BLUE CROSS/BLUE SHIELD | Admitting: Medical

## 2016-10-01 VITALS — BP 122/88 | HR 96 | Temp 98.6°F | Resp 16 | Ht 72.0 in | Wt 281.6 lb

## 2016-10-01 DIAGNOSIS — E119 Type 2 diabetes mellitus without complications: Secondary | ICD-10-CM | POA: Diagnosis not present

## 2016-10-01 DIAGNOSIS — Z0000 Encounter for general adult medical examination without abnormal findings: Secondary | ICD-10-CM

## 2016-10-01 LAB — LIPID PANEL
CHOL/HDL RATIO: 3
Cholesterol: 136 mg/dL (ref 0–200)
HDL: 46.5 mg/dL (ref >39.00)
LDL Cholesterol: 70 mg/dL (ref 0–99)
NONHDL: 89.7
Triglycerides: 98 mg/dL (ref 0.0–149.0)
VLDL: 19.6 mg/dL (ref 0.0–40.0)

## 2016-10-01 LAB — COMPREHENSIVE METABOLIC PANEL
ALT: 81 U/L — ABNORMAL HIGH (ref 0–53)
AST: 39 U/L — AB (ref 0–37)
Albumin: 4.5 g/dL (ref 3.5–5.2)
Alkaline Phosphatase: 58 U/L (ref 39–117)
BUN: 16 mg/dL (ref 6–23)
CHLORIDE: 101 meq/L (ref 96–112)
CO2: 29 meq/L (ref 19–32)
CREATININE: 1.29 mg/dL (ref 0.40–1.50)
Calcium: 9.6 mg/dL (ref 8.4–10.5)
GFR: 61.22 mL/min (ref >60.00)
Glucose, Bld: 161 mg/dL — ABNORMAL HIGH (ref 70–99)
Potassium: 4.5 mEq/L (ref 3.5–5.1)
SODIUM: 139 meq/L (ref 135–145)
Total Bilirubin: 0.6 mg/dL (ref 0.2–1.2)
Total Protein: 7.4 g/dL (ref 6.0–8.3)

## 2016-10-01 LAB — POC URINALSYSI DIPSTICK (AUTOMATED)
Blood, UA: NEGATIVE
Glucose, UA: NEGATIVE
KETONES UA: NEGATIVE
LEUKOCYTES UA: NEGATIVE
Nitrite, UA: NEGATIVE
Spec Grav, UA: >=1.03
Urobilinogen, UA: 0.2
pH, UA: 5.5

## 2016-10-01 LAB — CBC WITH DIFFERENTIAL/PLATELET
BASOS PCT: 0.5 % (ref 0.0–3.0)
Basophils Absolute: 0 10*3/uL (ref 0.0–0.1)
EOS ABS: 0.5 10*3/uL (ref 0.0–0.7)
Eosinophils Relative: 4.9 % (ref 0.0–5.0)
HCT: 49.4 % (ref 39.0–52.0)
Hemoglobin: 17 g/dL (ref 13.0–17.0)
Lymphocytes Relative: 18 % (ref 12.0–46.0)
Lymphs Abs: 1.8 10*3/uL (ref 0.7–4.0)
MCHC: 34.3 g/dL (ref 30.0–36.0)
MCV: 92.4 fl (ref 78.0–100.0)
MONO ABS: 0.8 10*3/uL (ref 0.1–1.0)
Monocytes Relative: 8.4 % (ref 3.0–12.0)
NEUTROS ABS: 6.8 10*3/uL (ref 1.4–7.7)
NEUTROS PCT: 68.2 % (ref 43.0–77.0)
PLATELETS: 263 10*3/uL (ref 150.0–400.0)
RBC: 5.34 Mil/uL (ref 4.22–5.81)
RDW: 14.3 % (ref 11.5–15.5)
WBC: 9.9 10*3/uL (ref 4.0–10.5)

## 2016-10-01 LAB — TSH: TSH: 1.44 u[IU]/mL (ref 0.35–4.50)

## 2016-10-01 LAB — HEMOGLOBIN A1C
Hgb A1c MFr Bld: 7.6 % — ABNORMAL HIGH (ref <5.7)
MEAN PLASMA GLUCOSE: 171 mg/dL

## 2016-10-01 MED ORDER — CLOTRIMAZOLE-BETAMETHASONE 1-0.05 % EX CREA: 1.0000 "application " | TOPICAL_CREAM | Freq: Two times a day (BID) | CUTANEOUS | 1 refills | Status: DC

## 2016-10-01 MED FILL — CLOTRIMAZOLE-BETAMETHASONE: 1-0.05 | 15 days supply | Qty: 30 | Fill #0

## 2016-10-01 MED FILL — ATORVASTATIN 20 MG TABLET: 20 | 30 days supply | Qty: 30 | Fill #6

## 2016-10-01 NOTE — Progress Notes (Signed)
Subjective:    Patient ID: Kevin Wiggins, male    DOB: 14-Jul-1961, 55 y.o.   MRN: [id number]  HPI   I have reviewed pt PMH, PSH, FH, Social History and Surgical History   Pt appears to be up to date on his vaccines.  No acute symptoms today.  Pt has moderate/good diet recently. He is not exercising recently but he is about to restart exercising after beginning of the year. Married.  Pt psa recently normal.  Pt is up to date on his colonoscopy.  Pt declines flu vaccine.    Review of Systems  Constitutional: Negative for chills, fatigue and fever.  HENT: Negative for congestion, ear pain, hearing loss, mouth sores, postnasal drip, rhinorrhea, sinus pain, sinus pressure and sore throat.   Respiratory: Negative for cough, chest tightness, shortness of breath and wheezing.   Cardiovascular: Negative for chest pain and palpitations.  Gastrointestinal: Negative for abdominal pain, constipation, diarrhea, nausea and vomiting.  Genitourinary: Negative for difficulty urinating, flank pain, frequency, hematuria, testicular pain and urgency.  Musculoskeletal: Negative for back pain and gait problem.  Skin: Positive for rash.       Pt has faint small medial rash and dry feel to skin rt calf.  Neurological: Negative for dizziness and headaches.  Hematological: Negative for adenopathy. Does not bruise/bleed easily.  Psychiatric/Behavioral: Negative for behavioral problems, confusion, decreased concentration and dysphoric mood. The patient is not nervous/anxious.      Past Medical History:  Diagnosis Date  . Borderline diabetic    a. A1C 6.6 in 07/2013 - needs to f/u PCP for likely full DM.  [name] CAD (coronary artery disease)    a. 07/2013: mild nonobstructive CAD by cath 07/25/13 as part of eval for VSD/SOB.  . CKD (chronic kidney disease)   . Diabetes mellitus without complication ([place])   . Diastolic CHF ([place])    a. Acute diastolic CHF 99991111 in the setting of perimembranous VSD.  Eval underway given mod pulm HTN and RV dilation.  . ED (erectile dysfunction)   . Erythrocytosis    secondary to testosterone replacement  . GERD (gastroesophageal reflux disease)   . H/O: hypothyroidism   . Heart murmur   . Hemochromatosis    H63D homozyous mutation  . Hyperlipidemia   . Hypertension   . Hypogonadism male   . LAFB (left anterior fascicular block)   . Leukocytosis    secondary to testosterone replacement  . Pulmonary HTN    a. 07/2013: Moderate pulmonary HTN with RV dilitation.  . RBBB   . Refusal of blood transfusions as patient is Jehovah's Witness   . Sleep apnea    does not use CPAP sleeps on stomach  . VSD (ventricular septal defect), perimembranous    a. Dx 07/2013 with significant L-R shunt.     Social History   Social History  . Marital status: Married    Spouse name: N/A  . Number of children: N/A  . Years of education: N/A   Occupational History  .  [employer]    Maintenance    Social History Main Topics  . Smoking status: Never Smoker  . Smokeless tobacco: Never Used     Comment: never used tobacco  . Alcohol use No  . Drug use: No  . Sexual activity: Not on file   Other Topics Concern  . Not on file   Social History Narrative  . No narrative on file    Past Surgical History:  Procedure Laterality  Date  . [place] patched  . LEFT AND RIGHT HEART CATHETERIZATION WITH CORONARY ANGIOGRAM N/A 07/25/2013   Procedure: LEFT AND RIGHT HEART CATHETERIZATION WITH CORONARY ANGIOGRAM;  Surgeon: Jolaine Artist, MD;  Location: [facility] CATH LAB;  Service: Cardiovascular;  Laterality: N/A;  . phlebotomy    . TEE WITHOUT CARDIOVERSION N/A 07/25/2013   Procedure: TRANSESOPHAGEAL ECHOCARDIOGRAM (TEE);  Surgeon: Dorothy Spark, MD;  Location: [facility] ENDOSCOPY;  Service: Cardiovascular;  Laterality: N/A;    Family History  Problem Relation Age of Onset  . Cancer Mother   . Hypertension Father   . Congestive Heart Failure  Father   . Colon cancer Paternal Aunt   . Hemochromatosis      family history    Allergies  Allergen Reactions  . Amlodipine Other (See Comments)    Pt reports dizziness and sleepy.   . Lisinopril Other (See Comments)    cough    Current Outpatient Prescriptions on File Prior to Visit  Medication Sig Dispense Refill  . atorvastatin (LIPITOR) 20 MG tablet Take 1 tablet (20 mg total) by mouth daily. 30 tablet 11  . diclofenac (VOLTAREN) 75 MG EC tablet Take 1 tablet (75 mg total) by mouth 2 (two) times daily. 20 tablet 0  . losartan (COZAAR) 50 MG tablet TAKE 1 TABLET (50 MG TOTAL) BY MOUTH DAILY. 90 tablet 3  . metFORMIN (GLUCOPHAGE) 500 MG tablet Take 2 tablets (1,000 mg total) by mouth 2 (two) times daily with a meal. 120 tablet 3  . omeprazole (PRILOSEC) 20 MG capsule Take 20 mg by mouth daily.    . sildenafil (VIAGRA) 100 MG tablet Take 1 tablet (100 mg total) by mouth daily as needed for erectile dysfunction. 10 tablet 0  . sulfamethoxazole-trimethoprim (BACTRIM DS,SEPTRA DS) 800-160 MG tablet Take 1 tablet by mouth 2 (two) times daily. 20 tablet 0  . testosterone cypionate (DEPOTESTOSTERONE CYPIONATE) 200 MG/ML injection Inject 1 mL (200 mg total) into the muscle every 14 (fourteen) days. As needed now 10 mL 3  . triamcinolone ointment (KENALOG) 0.5 % Apply 1 application topically 2 (two) times daily. 30 g 0   No current facility-administered medications on file prior to visit.     BP 122/88 (BP Location: Left Arm, Patient Position: Sitting, Cuff Size: Large)   Pulse 96   Temp 98.6 F (37 C) (Oral)   Resp 16   Ht 6' (1.829 m)   Wt 281 lb 9.6 oz (127.7 kg)   SpO2 97%   BMI 38.19 kg/m        Objective:   Physical Exam  General Mental Status- Alert. General Appearance- Not in acute distress.   Skin General: Color- Normal Color. Moisture- Normal Moisture.  Neck Carotid Arteries- Normal color. Moisture- Normal Moisture. No carotid bruits. No JVD.  Chest and Lung  Exam Auscultation: Breath Sounds:-Normal.  Cardiovascular Auscultation:Rythm- Regular. Murmurs & Other Heart Sounds:Auscultation of the heart reveals- No Murmurs.  Abdomen Inspection:-Inspeection Normal. Palpation/Percussion:Note:No mass. Palpation and Percussion of the abdomen reveal- Non Tender, Non Distended + BS, no rebound or guarding.    Neurologic Cranial Nerve exam:- CN III-XII intact(No nystagmus), symmetric smile. Drift Test:- No drift. Romberg Exam:- Negative.  Heal to Toe Gait exam:-Normal. Finger to Nose:- Normal/Intact Strength:- 5/5 equal and symmetric strength both upper and lower extremities.  Genital exam- deferred since checked on last exam.  Rt calf- medial aspect mild rash. Faint red. No warmth or tenderness.  Assessment & Plan:  For you wellness exam today I have ordered cbc, cmp, tsh, lipid panel, and ua  Adding a1c today to check your average blood sugar today.  On review appears up to date on vaccine. Flu vaccine deferred today.   Recommend exercise and healthy diet.  For slight rash on rt calf will rx lotrisone and moisturize twice daily.  We will let you know lab results as they come in.  Follow up date appointment will be determined after lab review.

## 2016-10-01 NOTE — Patient Instructions (Addendum)
For you wellness exam today I have ordered cbc, cmp, tsh, lipid panel, and ua  Adding a1c today to check your average blood sugar today.  On review appears up to date on vaccine. Flu vaccine deferred today.   Recommend exercise and healthy diet.  For slight rash on rt calf will rx lotrisone and moisturize twice daily.(if rash does not completely within 10 days let us know)  We will let you know lab results as they come in.  Follow up date appointment will be determined after lab review.    Preventive Care 40-64 Years, Male Preventive care refers to lifestyle choices and visits with your health care provider that can promote health and wellness. What does preventive care include?  A yearly physical exam. This is also called an annual well check.  Dental exams once or twice a year.  Routine eye exams. Ask your health care provider how often you should have your eyes checked.  Personal lifestyle choices, including:  Daily care of your teeth and gums.  Regular physical activity.  Eating a healthy diet.  Avoiding tobacco and drug use.  Limiting alcohol use.  Practicing safe sex.  Taking low-dose aspirin every day starting at age 33. What happens during an annual well check? The services and screenings done by your health care provider during your annual well check will depend on your age, overall health, lifestyle risk factors, and family history of disease. Counseling  Your health care provider may ask you questions about your:  Alcohol use.  Tobacco use.  Drug use.  Emotional well-being.  Home and relationship well-being.  Sexual activity.  Eating habits.  Work and work Statistician. Screening  You may have the following tests or measurements:  Height, weight, and BMI.  Blood pressure.  Lipid and cholesterol levels. These may be checked every 5 years, or more frequently if you are over 99 years old.  Skin check.  Lung cancer screening. You may have  this screening every year starting at age 51 if you have a 30-pack-year history of smoking and currently smoke or have quit within the past 15 years.  Fecal occult blood test (FOBT) of the stool. You may have this test every year starting at age 52.  Flexible sigmoidoscopy or colonoscopy. You may have a sigmoidoscopy every 5 years or a colonoscopy every 10 years starting at age 38.  Prostate cancer screening. Recommendations will vary depending on your family history and other risks.  Hepatitis C blood test.  Hepatitis B blood test.  Sexually transmitted disease (STD) testing.  Diabetes screening. This is done by checking your blood sugar (glucose) after you have not eaten for a while (fasting). You may have this done every 1-3 years. Discuss your test results, treatment options, and if necessary, the need for more tests with your health care provider. Vaccines  Your health care provider may recommend certain vaccines, such as:  Influenza vaccine. This is recommended every year.  Tetanus, diphtheria, and acellular pertussis (Tdap, Td) vaccine. You may need a Td booster every 10 years.  Varicella vaccine. You may need this if you have not been vaccinated.  Zoster vaccine. You may need this after age 36.  Measles, mumps, and rubella (MMR) vaccine. You may need at least one dose of MMR if you were born in 1957 or later. You may also need a second dose.  Pneumococcal 13-valent conjugate (PCV13) vaccine. You may need this if you have certain conditions and have not been vaccinated.  Pneumococcal  polysaccharide (PPSV23) vaccine. You may need one or two doses if you smoke cigarettes or if you have certain conditions.  Meningococcal vaccine. You may need this if you have certain conditions.  Hepatitis A vaccine. You may need this if you have certain conditions or if you travel or work in places where you may be exposed to hepatitis A.  Hepatitis B vaccine. You may need this if you have  certain conditions or if you travel or work in places where you may be exposed to hepatitis B.  Haemophilus influenzae type b (Hib) vaccine. You may need this if you have certain risk factors. Talk to your health care provider about which screenings and vaccines you need and how often you need them. This information is not intended to replace advice given to you by your health care provider. Make sure you discuss any questions you have with your health care provider. Document Released: 10/19/2015 Document Revised: 06/11/2016 Document Reviewed: 07/24/2015 Elsevier Interactive Patient Education  2017 [employer].

## 2016-10-10 MED FILL — LOSARTAN POTASSIUM 50 MG TA: 50 | 30 days supply | Qty: 30 | Fill #6

## 2016-10-27 ENCOUNTER — Other Ambulatory Visit: Payer: Self-pay | Admitting: Medical

## 2016-10-27 MED FILL — metFORMIN HCL 500 MG TABS: 500 | 30 days supply | Qty: 120 | Fill #0

## 2016-10-31 MED FILL — ATORVASTATIN 20 MG TABLET: 20 | 30 days supply | Qty: 30 | Fill #7

## 2016-11-11 MED FILL — LOSARTAN POTASSIUM 50 MG TA: 50 | 30 days supply | Qty: 30 | Fill #7

## 2016-11-28 MED FILL — metFORMIN HCL 500 MG TABS: 500 | 30 days supply | Qty: 120 | Fill #1

## 2016-12-02 MED FILL — ATORVASTATIN 20 MG TABLET: 20 | 30 days supply | Qty: 30 | Fill #8

## 2016-12-03 ENCOUNTER — Ambulatory Visit (HOSPITAL_BASED_OUTPATIENT_CLINIC_OR_DEPARTMENT_OTHER): Payer: BLUE CROSS/BLUE SHIELD

## 2016-12-03 ENCOUNTER — Ambulatory Visit (HOSPITAL_BASED_OUTPATIENT_CLINIC_OR_DEPARTMENT_OTHER): Payer: BLUE CROSS/BLUE SHIELD | Admitting: Hematology & Oncology

## 2016-12-03 ENCOUNTER — Other Ambulatory Visit (HOSPITAL_BASED_OUTPATIENT_CLINIC_OR_DEPARTMENT_OTHER): Payer: BLUE CROSS/BLUE SHIELD

## 2016-12-03 DIAGNOSIS — I5020 Unspecified systolic (congestive) heart failure: Secondary | ICD-10-CM

## 2016-12-03 DIAGNOSIS — D751 Secondary polycythemia: Secondary | ICD-10-CM

## 2016-12-03 DIAGNOSIS — E291 Testicular hypofunction: Secondary | ICD-10-CM

## 2016-12-03 LAB — CBC WITH DIFFERENTIAL (CANCER CENTER ONLY)
BASO#: 0.1 10*3/uL (ref 0.0–0.2)
BASO%: 0.5 % (ref 0.0–2.0)
EOS ABS: 0.4 10*3/uL (ref 0.0–0.5)
EOS%: 3.7 % (ref 0.0–7.0)
HEMATOCRIT: 47.7 % (ref 38.7–49.9)
HGB: 17.2 g/dL — ABNORMAL HIGH (ref 13.0–17.1)
LYMPH#: 1.7 10*3/uL (ref 0.9–3.3)
LYMPH%: 16.3 % (ref 14.0–48.0)
MCH: 32.5 pg (ref 28.0–33.4)
MCHC: 36.1 g/dL — ABNORMAL HIGH (ref 32.0–35.9)
MCV: 90 fL (ref 82–98)
MONO#: 0.8 10*3/uL (ref 0.1–0.9)
MONO%: 8.1 % (ref 0.0–13.0)
NEUT#: 7.4 10*3/uL — ABNORMAL HIGH (ref 1.5–6.5)
NEUT%: 71.4 % (ref 40.0–80.0)
Platelets: 248 10*3/uL (ref 145–400)
RBC: 5.29 10*6/uL (ref 4.20–5.70)
RDW: 13.3 % (ref 11.1–15.7)
WBC: 10.3 10*3/uL — AB (ref 4.0–10.0)

## 2016-12-03 LAB — CMP (CANCER CENTER ONLY)
ALBUMIN: 4 g/dL (ref 3.3–5.5)
ALK PHOS: 56 U/L (ref 26–84)
ALT: 80 U/L — AB (ref 10–47)
AST: 49 U/L — ABNORMAL HIGH (ref 11–38)
BUN: 16 mg/dL (ref 7–22)
CALCIUM: 9.2 mg/dL (ref 8.0–10.3)
CO2: 27 mEq/L (ref 18–33)
Chloride: 98 mEq/L (ref 98–108)
Creat: 1.2 mg/dl (ref 0.6–1.2)
Glucose, Bld: 228 mg/dL — ABNORMAL HIGH (ref 73–118)
POTASSIUM: 4.3 meq/L (ref 3.3–4.7)
Sodium: 137 mEq/L (ref 128–145)
TOTAL PROTEIN: 7.2 g/dL (ref 6.4–8.1)
Total Bilirubin: 1.1 mg/dl (ref 0.20–1.60)

## 2016-12-03 NOTE — Progress Notes (Signed)
Hematology and Oncology Follow Up Visit  Kevin Wiggins:[id number] Mar 13, 1961 56 y.o. 12/03/2016   Principle Diagnosis:  1.  Hemochromatosis (homozygous for H63D mutation). 2. Hypogonadism secondary to hemochromatosis. 3. Erythrocytosis secondary to testosterone therapy. 4. Ruptured right sinus of Valsalva aneurysm.  Current Therapy:   1. The patient status post cardiac surgery to repair the sinus of     Valsalva aneurysm. 2. Phlebotomy to keep hematocrit below 48%. 3. Testosterone 200 mg IM q.2 weeks.     Interim History:  [name] is back for followup. He had a very good first anniversary. He had a very good Christmas. He and his wife enjoyed their first holiday together.  He is still working. He does not want to retire. He gave me a very funny "line" about how he would retire.   His blood sugars are still high. This will be a problem for him if he does not get the blood sugars better.  He has had no problems with his testosterone. He does this at home.  He does not have to go out to Kevin Wiggins any longer. They're very pleased with how he has recovered from his emergency cardiac surgery. This was 3 years ago.   His iron studies have been doing great. Back in November, severity and was 42 with iron saturation of 24%. We really have never had a problem with the hemochromatosis.   Overall, his performance status is ECOG 0.  Medications:  Current Outpatient Prescriptions:  .  atorvastatin (LIPITOR) 20 MG tablet, Take 1 tablet (20 mg total) by mouth daily., Disp: 30 tablet, Rfl: 11 .  losartan (COZAAR) 50 MG tablet, TAKE 1 TABLET (50 MG TOTAL) BY MOUTH DAILY., Disp: 90 tablet, Rfl: 3 .  metFORMIN (GLUCOPHAGE) 500 MG tablet, TAKE 2 TABLETS (1,000 MG TOTAL) BY MOUTH 2 (TWO) TIMES DAILY WITH A MEAL., Disp: 120 tablet, Rfl: 2 .  omeprazole (PRILOSEC) 20 MG capsule, Take 20 mg by mouth daily., Disp: , Rfl:  .  sildenafil (VIAGRA) 100 MG tablet, Take 1 tablet (100 mg total) by mouth daily as  needed for erectile dysfunction., Disp: 10 tablet, Rfl: 0 .  testosterone cypionate (DEPOTESTOSTERONE CYPIONATE) 200 MG/ML injection, Inject 1 mL (200 mg total) into the muscle every 14 (fourteen) days. As needed now (Patient taking differently: Inject 200 mg into the muscle every 14 (fourteen) days. As needed now), Disp: 10 mL, Rfl: 3 .  triamcinolone ointment (KENALOG) 0.5 %, Apply 1 application topically 2 (two) times daily., Disp: 30 g, Rfl: 0  Allergies:  Allergies  Allergen Reactions  . Amlodipine Other (See Comments)    Pt reports dizziness and sleepy.   . Lisinopril Other (See Comments)    cough    Past Medical History, Surgical history, Social history, and Family History were reviewed and updated.  Review of Systems: As above  Physical Exam:  weight is 280 lb 6.4 oz (127.2 kg). His oral temperature is 98.6 F (37 C). His blood pressure is 122/54 (abnormal) and his pulse is 96. His respiration is 20.   Well-developed and well-nourished white gentleman. Head and neck exam shows no ocular or oral lesions. He has no conjunctival inflammation. He has no palpable cervical or supraclavicular lymph nodes. Lungs are clear. Cardiac exam regular rate and rhythm with no murmurs rubs or bruits. Abdomen is soft. He is moderately obese. There is no fluid wave. There is no palpable liver or spleen tip. Back exam shows no tenderness over the spine ribs  or hips. Extremities shows no clubbing cyanosis or edema. Neurological exam shows no focal neurological deficits. Skin exam a macular type rash on his ankles. He also has areas of macular rash with some papular areas on his inner thigh. No blisters are noted. .   Lab Results  Component Value Date   WBC 10.3 (H) 12/03/2016   HGB 17.2 (H) 12/03/2016   HCT 47.7 12/03/2016   MCV 90 12/03/2016   PLT 248 12/03/2016     Chemistry      Component Value Date/Time   NA 137 12/03/2016 1354   NA 140 06/26/2016 1346   K 4.3 12/03/2016 1354   K 4.5  06/26/2016 1346   CL 98 12/03/2016 1354   CO2 27 12/03/2016 1354   CO2 28 06/26/2016 1346   BUN 16 12/03/2016 1354   BUN 13.6 06/26/2016 1346   CREATININE 1.2 12/03/2016 1354   CREATININE 1.3 06/26/2016 1346      Component Value Date/Time   CALCIUM 9.2 12/03/2016 1354   CALCIUM 9.6 06/26/2016 1346   ALKPHOS 56 12/03/2016 1354   ALKPHOS 65 06/26/2016 1346   AST 49 (H) 12/03/2016 1354   AST 36 (H) 06/26/2016 1346   ALT 80 (H) 12/03/2016 1354   ALT 66 (H) 06/26/2016 1346   BILITOT 1.10 12/03/2016 1354   BILITOT 0.74 06/26/2016 1346         Impression and Plan: Kevin Wiggins is 56 year old white male.[name] He has a homozygous mutation for one of the minor genes for hemochromatosis.    For now, we will have to phlebotomize him. The hematocrit is below 48%, but is close now that I think we should phlebotomize him.. This is outstanding.  I think we've probably get him back in about 3 months. I think this would be reasonable.  I really think that in the long-term, his problem will be diabetes. His blood sugars just are quite high. His LFTs are a little elevated which I think probably reflect some hepatic steatosis.   [name], MD 2/28/20183:43 PM

## 2016-12-03 NOTE — Progress Notes (Signed)
Kevin Wiggins presents today for phlebotomy per MD orders. Phlebotomy procedure started at 1500 and ended at 1510. 500 grams removed. Patient observed for 30 minutes after procedure without any incident. Patient tolerated procedure well. IV needle removed intact.

## 2016-12-03 NOTE — Patient Instructions (Signed)
Therapeutic Phlebotomy Therapeutic phlebotomy is the controlled removal of blood from a person's body for the purpose of treating a medical condition. The procedure is similar to donating blood. Usually, about a pint (470 mL, or 0.47L) of blood is removed. The average adult has 9-12 pints (4.3-5.7 L) of blood. Therapeutic phlebotomy may be used to treat the following medical conditions:  Hemochromatosis. This is a condition in which the blood contains too much iron.  Polycythemia vera. This is a condition in which the blood contains too many red blood cells.  Porphyria cutanea tarda. This is a disease in which an important part of hemoglobin is not made properly. It results in the buildup of abnormal amounts of porphyrins in the body.  Sickle cell disease. This is a condition in which the red blood cells form an abnormal crescent shape rather than a round shape. Tell a health care provider about:  Any allergies you have.  All medicines you are taking, including vitamins, herbs, eye drops, creams, and over-the-counter medicines.  Any problems you or family members have had with anesthetic medicines.  Any blood disorders you have.  Any surgeries you have had.  Any medical conditions you have. What are the risks? Generally, this is a safe procedure. However, problems may occur, including:  Nausea or light-headedness.  Low blood pressure.  Soreness, bleeding, swelling, or bruising at the needle insertion site.  Infection. What happens before the procedure?  Follow instructions from your health care provider about eating or drinking restrictions.  Ask your health care provider about changing or stopping your regular medicines. This is especially important if you are taking diabetes medicines or blood thinners.  Wear clothing with sleeves that can be raised above the elbow.  Plan to have someone take you home after the procedure.  You may have a blood sample taken. What happens  during the procedure?  A needle will be inserted into one of your veins.  Tubing and a collection bag will be attached to that needle.  Blood will flow through the needle and tubing into the collection bag.  You may be asked to open and close your hand slowly and continually during the entire collection.  After the specified amount of blood has been removed from your body, the collection bag and tubing will be clamped.  The needle will be removed from your vein.  Pressure will be held on the site of the needle insertion to stop the bleeding.  A bandage (dressing) will be placed over the needle insertion site. The procedure may vary among health care providers and hospitals. What happens after the procedure?  Your recovery will be assessed and monitored.  You can return to your normal activities as directed by your health care provider. This information is not intended to replace advice given to you by your health care provider. Make sure you discuss any questions you have with your health care provider. Document Released: 02/24/2011 Document Revised: 05/24/2016 Document Reviewed: 09/18/2014 Elsevier Interactive Patient Education  2017 [employer] Inc[employer]  

## 2016-12-04 LAB — IRON AND TIBC
%SAT: 43 % (ref 20–55)
IRON: 139 ug/dL (ref 42–163)
TIBC: 325 ug/dL (ref 202–409)
UIBC: 186 ug/dL (ref 117–376)

## 2016-12-04 LAB — FERRITIN: Ferritin: 57 ng/ml (ref 22–316)

## 2016-12-04 LAB — TESTOSTERONE: TESTOSTERONE: 352 ng/dL (ref 264–916)

## 2016-12-11 MED FILL — LOSARTAN POTASSIUM 50 MG TA: 50 | 30 days supply | Qty: 30 | Fill #8

## 2016-12-31 MED FILL — metFORMIN HCL 500 MG TABS: 500 | 30 days supply | Qty: 120 | Fill #2

## 2016-12-31 MED FILL — ATORVASTATIN 20 MG TABLET: 20 | 30 days supply | Qty: 30 | Fill #9

## 2017-01-12 MED FILL — LOSARTAN POTASSIUM 50 MG TA: 50 | 30 days supply | Qty: 30 | Fill #9

## 2017-02-02 ENCOUNTER — Other Ambulatory Visit: Payer: Self-pay | Admitting: Medical

## 2017-02-02 MED FILL — metFORMIN HCL 500 MG TABS: 500 | 30 days supply | Qty: 60 | Fill #1

## 2017-02-02 MED FILL — ATORVASTATIN 20 MG TABLET: 20 | 30 days supply | Qty: 30 | Fill #10

## 2017-02-06 MED FILL — LOSARTAN POTASSIUM 50 MG TA: 50 | 30 days supply | Qty: 30 | Fill #10

## 2017-02-20 MED FILL — metFORMIN HCL 500 MG TABS: 500 | 30 days supply | Qty: 120 | Fill #0

## 2017-03-04 ENCOUNTER — Ambulatory Visit (HOSPITAL_BASED_OUTPATIENT_CLINIC_OR_DEPARTMENT_OTHER): Payer: BLUE CROSS/BLUE SHIELD | Admitting: Hematology & Oncology

## 2017-03-04 ENCOUNTER — Other Ambulatory Visit (HOSPITAL_BASED_OUTPATIENT_CLINIC_OR_DEPARTMENT_OTHER): Payer: BLUE CROSS/BLUE SHIELD

## 2017-03-04 VITALS — BP 128/84 | HR 91 | Temp 97.7°F | Resp 17 | Wt 278.8 lb

## 2017-03-04 DIAGNOSIS — R7989 Other specified abnormal findings of blood chemistry: Secondary | ICD-10-CM

## 2017-03-04 DIAGNOSIS — R945 Abnormal results of liver function studies: Principal | ICD-10-CM

## 2017-03-04 DIAGNOSIS — I5020 Unspecified systolic (congestive) heart failure: Secondary | ICD-10-CM

## 2017-03-04 LAB — CBC WITH DIFFERENTIAL (CANCER CENTER ONLY)
BASO#: 0 10*3/uL (ref 0.0–0.2)
BASO%: 0.4 % (ref 0.0–2.0)
EOS%: 4.8 % (ref 0.0–7.0)
Eosinophils Absolute: 0.5 10*3/uL (ref 0.0–0.5)
HCT: 47.4 % (ref 38.7–49.9)
HGB: 16.8 g/dL (ref 13.0–17.1)
LYMPH#: 1.8 10*3/uL (ref 0.9–3.3)
LYMPH%: 18.8 % (ref 14.0–48.0)
MCH: 32.2 pg (ref 28.0–33.4)
MCHC: 35.4 g/dL (ref 32.0–35.9)
MCV: 91 fL (ref 82–98)
MONO#: 0.7 10*3/uL (ref 0.1–0.9)
MONO%: 6.7 % (ref 0.0–13.0)
NEUT#: 6.7 10*3/uL — ABNORMAL HIGH (ref 1.5–6.5)
NEUT%: 69.3 % (ref 40.0–80.0)
PLATELETS: 220 10*3/uL (ref 145–400)
RBC: 5.21 10*6/uL (ref 4.20–5.70)
RDW: 13.3 % (ref 11.1–15.7)
WBC: 9.7 10*3/uL (ref 4.0–10.0)

## 2017-03-04 LAB — CMP (CANCER CENTER ONLY)
ALK PHOS: 56 U/L (ref 26–84)
ALT: 93 U/L — AB (ref 10–47)
AST: 55 U/L — AB (ref 11–38)
Albumin: 3.9 g/dL (ref 3.3–5.5)
BILIRUBIN TOTAL: 1.1 mg/dL (ref 0.20–1.60)
BUN: 12 mg/dL (ref 7–22)
CO2: 25 mEq/L (ref 18–33)
CREATININE: 1 mg/dL (ref 0.6–1.2)
Calcium: 9.4 mg/dL (ref 8.0–10.3)
Chloride: 103 mEq/L (ref 98–108)
GLUCOSE: 201 mg/dL — AB (ref 73–118)
POTASSIUM: 3.6 meq/L (ref 3.3–4.7)
SODIUM: 137 meq/L (ref 128–145)
TOTAL PROTEIN: 7 g/dL (ref 6.4–8.1)

## 2017-03-04 MED FILL — ATORVASTATIN 20 MG TABLET: 20 | 30 days supply | Qty: 30 | Fill #11

## 2017-03-04 NOTE — Progress Notes (Signed)
Hematology and Oncology Follow Up Visit  Kevin Wiggins [id number] 10/04/1961 56 y.o. 03/04/2017   Principle Diagnosis:  1.  Hemochromatosis (homozygous for H63D mutation). 2. Hypogonadism secondary to hemochromatosis. 3. Erythrocytosis secondary to testosterone therapy. 4. Ruptured right sinus of Valsalva aneurysm.  Current Therapy:   1. The patient status post cardiac surgery to repair the sinus of     Valsalva aneurysm. 2. Phlebotomy to keep hematocrit below 48%. 3. Testosterone 200 mg IM q.2 weeks.     Interim History:  Kevin Wiggins is back for followup. He just got back from the [facility]. He does wife went therefore Memorial Day weekend. Unfortunately, it is raining the whole time. They still enjoy themselves.  He is on metformin. His blood sugars still are not all that well controlled. His blood sugars today were 201.  His iron studies have never been a problem. In February, his ferritin was 57 with an iron saturation of 43%.  He's had no fever. He's had no nausea or vomiting. He's had no cardiac issues. I don't think he  needs to go back to [facility] any further.  He is still working. He is quite busy at work.  It sounds like he and his wife will be going anywhere this summer. Their second anniversary is coming up in October.  Overall, his performance status is ECOG 0.  Medications:  Current Outpatient Prescriptions:  .  atorvastatin (LIPITOR) 20 MG tablet, Take 1 tablet (20 mg total) by mouth daily., Disp: 30 tablet, Rfl: 11 .  losartan (COZAAR) 50 MG tablet, TAKE 1 TABLET (50 MG TOTAL) BY MOUTH DAILY., Disp: 90 tablet, Rfl: 3 .  metFORMIN (GLUCOPHAGE) 500 MG tablet, TAKE TWO TABLETS BY MOUTH 2 TIMES A DAY WITH A MEAL, Disp: 120 tablet, Rfl: 1 .  omeprazole (PRILOSEC) 20 MG capsule, Take 20 mg by mouth daily., Disp: , Rfl:  .  sildenafil (VIAGRA) 100 MG tablet, Take 1 tablet (100 mg total) by mouth daily as needed for erectile dysfunction., Disp: 10 tablet, Rfl: 0 .   testosterone cypionate (DEPOTESTOSTERONE CYPIONATE) 200 MG/ML injection, Inject 1 mL (200 mg total) into the muscle every 14 (fourteen) days. As needed now (Patient taking differently: Inject 200 mg into the muscle every 14 (fourteen) days. As needed now), Disp: 10 mL, Rfl: 3 .  triamcinolone ointment (KENALOG) 0.5 %, Apply 1 application topically 2 (two) times daily., Disp: 30 g, Rfl: 0  Allergies:  Allergies  Allergen Reactions  . Amlodipine Other (See Comments)    Pt reports dizziness and sleepy.   . Lisinopril Other (See Comments)    cough    Past Medical History, Surgical history, Social history, and Family History were reviewed and updated.  Review of Systems: As above  Physical Exam:  weight is 278 lb 12.8 oz (126.5 kg). His oral temperature is 97.7 F (36.5 C). His blood pressure is 128/84 and his pulse is 91. His respiration is 17 and oxygen saturation is 97%.   Well-developed and well-nourished white gentleman. Head and neck exam shows no ocular or oral lesions. He has no conjunctival inflammation. He has no palpable cervical or supraclavicular lymph nodes. Lungs are clear. Cardiac exam regular rate and rhythm with no murmurs rubs or bruits. Abdomen is soft. He is moderately obese. There is no fluid wave. There is no palpable liver or spleen tip. Back exam shows no tenderness over the spine ribs or hips. Extremities shows no clubbing cyanosis or edema. Neurological exam shows no  focal neurological deficits. Skin exam a macular type rash on his ankles. He also has areas of macular rash with some papular areas on his inner thigh. No blisters are noted. .   Lab Results  Component Value Date   WBC 9.7 03/04/2017   HGB 16.8 03/04/2017   HCT 47.4 03/04/2017   MCV 91 03/04/2017   PLT 220 03/04/2017     Chemistry      Component Value Date/Time   NA 137 03/04/2017 1445   NA 140 06/26/2016 1346   K 3.6 03/04/2017 1445   K 4.5 06/26/2016 1346   CL 103 03/04/2017 1445   CO2 25  03/04/2017 1445   CO2 28 06/26/2016 1346   BUN 12 03/04/2017 1445   BUN 13.6 06/26/2016 1346   CREATININE 1.0 03/04/2017 1445   CREATININE 1.3 06/26/2016 1346      Component Value Date/Time   CALCIUM 9.4 03/04/2017 1445   CALCIUM 9.6 06/26/2016 1346   ALKPHOS 56 03/04/2017 1445   ALKPHOS 65 06/26/2016 1346   AST 55 (H) 03/04/2017 1445   AST 36 (H) 06/26/2016 1346   ALT 93 (H) 03/04/2017 1445   ALT 66 (H) 06/26/2016 1346   BILITOT 1.10 03/04/2017 1445   BILITOT 0.74 06/26/2016 1346         Impression and Plan: Kevin Wiggins is 56 year old white male.[name] He has a homozygous mutation for one of the minor genes for hemochromatosis.    We will hold off on phlebotomizing him. I think we can wait until we see him back.  I will like to see him back in 2 months.  We will need to get an ultrasound of his liver. I worry that he is developing hepatic steatosis. He does have blood sugars that are not too well controlled. I suspect that he probably will need to be on something other than metformin to help with his blood sugars.   [name], MD 5/30/20185:25 PM

## 2017-03-05 LAB — IRON AND TIBC
%SAT: 25 % (ref 20–55)
Iron: 84 ug/dL (ref 42–163)
TIBC: 335 ug/dL (ref 202–409)
UIBC: 251 ug/dL (ref 117–376)

## 2017-03-05 LAB — HEMOGLOBIN A1C
Est. average glucose Bld gHb Est-mCnc: 240 mg/dL
Hemoglobin A1c: 10 % — ABNORMAL HIGH (ref 4.8–5.6)

## 2017-03-05 LAB — FERRITIN: FERRITIN: 43 ng/mL (ref 22–316)

## 2017-03-05 LAB — TESTOSTERONE: TESTOSTERONE: 854 ng/dL (ref 264–916)

## 2017-03-11 ENCOUNTER — Ambulatory Visit (HOSPITAL_BASED_OUTPATIENT_CLINIC_OR_DEPARTMENT_OTHER): Payer: BLUE CROSS/BLUE SHIELD

## 2017-03-12 MED FILL — LOSARTAN POTASSIUM 50 MG TA: 50 | 30 days supply | Qty: 30 | Fill #11

## 2017-03-26 MED FILL — metFORMIN HCL 500 MG TABS: 500 | 30 days supply | Qty: 120 | Fill #1

## 2017-04-07 ENCOUNTER — Other Ambulatory Visit: Payer: Self-pay | Admitting: Cardiology

## 2017-04-07 MED FILL — ATORVASTATIN 20 MG TABLET: 20 | 30 days supply | Qty: 30 | Fill #0

## 2017-04-14 ENCOUNTER — Other Ambulatory Visit: Payer: Self-pay | Admitting: Cardiology

## 2017-04-15 MED FILL — LOSARTAN POTASSIUM 50 MG TA: 50 | 30 days supply | Qty: 30 | Fill #0

## 2017-04-15 NOTE — Telephone Encounter (Signed)
REFILL 

## 2017-04-30 ENCOUNTER — Other Ambulatory Visit: Payer: Self-pay | Admitting: Medical

## 2017-05-01 ENCOUNTER — Other Ambulatory Visit: Payer: Self-pay | Admitting: *Deleted

## 2017-05-01 MED FILL — metFORMIN HCL 500 MG TABS: 500 | 15 days supply | Qty: 60 | Fill #0

## 2017-05-01 NOTE — Telephone Encounter (Signed)
Pt due for follow up please call and schedule appointment.  

## 2017-05-04 ENCOUNTER — Other Ambulatory Visit: Payer: BLUE CROSS/BLUE SHIELD

## 2017-05-04 ENCOUNTER — Ambulatory Visit: Payer: BLUE CROSS/BLUE SHIELD | Admitting: Hematology & Oncology

## 2017-05-14 ENCOUNTER — Other Ambulatory Visit: Payer: Self-pay | Admitting: Cardiology

## 2017-05-14 MED FILL — LOSARTAN POTASSIUM 50 MG TA: 50 | 30 days supply | Qty: 30 | Fill #1

## 2017-05-15 ENCOUNTER — Other Ambulatory Visit: Payer: Self-pay | Admitting: Medical

## 2017-05-18 ENCOUNTER — Other Ambulatory Visit: Payer: BLUE CROSS/BLUE SHIELD

## 2017-05-18 ENCOUNTER — Ambulatory Visit: Payer: BLUE CROSS/BLUE SHIELD | Admitting: Hematology & Oncology

## 2017-05-19 MED FILL — metFORMIN HCL 500 MG TABS: 500 | 30 days supply | Qty: 120 | Fill #0

## 2017-05-19 NOTE — Telephone Encounter (Signed)
30 day supply of metformin sent to pharmacy. Pt is due for follow up with PCP at end of August, 06/04/17 or after for hgb a1c. Please call pt to schedule an appt with Percell Miller. Thanks!

## 2017-05-19 NOTE — Telephone Encounter (Signed)
Patient scheduled with PCP for 06/05/17

## 2017-05-20 ENCOUNTER — Ambulatory Visit (HOSPITAL_BASED_OUTPATIENT_CLINIC_OR_DEPARTMENT_OTHER): Payer: BLUE CROSS/BLUE SHIELD | Admitting: Hematology & Oncology

## 2017-05-20 ENCOUNTER — Other Ambulatory Visit (HOSPITAL_BASED_OUTPATIENT_CLINIC_OR_DEPARTMENT_OTHER): Payer: BLUE CROSS/BLUE SHIELD

## 2017-05-20 DIAGNOSIS — E291 Testicular hypofunction: Secondary | ICD-10-CM

## 2017-05-20 DIAGNOSIS — D751 Secondary polycythemia: Secondary | ICD-10-CM | POA: Diagnosis not present

## 2017-05-20 DIAGNOSIS — E349 Endocrine disorder, unspecified: Secondary | ICD-10-CM

## 2017-05-20 LAB — IRON AND TIBC
%SAT: 41 % (ref 20–55)
Iron: 122 ug/dL (ref 42–163)
TIBC: 300 ug/dL (ref 202–409)
UIBC: 178 ug/dL (ref 117–376)

## 2017-05-20 LAB — CBC WITH DIFFERENTIAL (CANCER CENTER ONLY)
BASO#: 0 10*3/uL (ref 0.0–0.2)
BASO%: 0.2 % (ref 0.0–2.0)
EOS%: 6.1 % (ref 0.0–7.0)
Eosinophils Absolute: 0.5 10*3/uL (ref 0.0–0.5)
HCT: 47.6 % (ref 38.7–49.9)
HEMOGLOBIN: 16.9 g/dL (ref 13.0–17.1)
LYMPH#: 1.4 10*3/uL (ref 0.9–3.3)
LYMPH%: 17 % (ref 14.0–48.0)
MCH: 32.2 pg (ref 28.0–33.4)
MCHC: 35.5 g/dL (ref 32.0–35.9)
MCV: 91 fL (ref 82–98)
MONO#: 0.6 10*3/uL (ref 0.1–0.9)
MONO%: 7.3 % (ref 0.0–13.0)
NEUT%: 69.4 % (ref 40.0–80.0)
NEUTROS ABS: 5.8 10*3/uL (ref 1.5–6.5)
PLATELETS: 221 10*3/uL (ref 145–400)
RBC: 5.25 10*6/uL (ref 4.20–5.70)
RDW: 13.4 % (ref 11.1–15.7)
WBC: 8.4 10*3/uL (ref 4.0–10.0)

## 2017-05-20 LAB — COMPREHENSIVE METABOLIC PANEL
ALBUMIN: 4 g/dL (ref 3.5–5.0)
ALK PHOS: 60 U/L (ref 40–150)
ALT: 69 U/L — ABNORMAL HIGH (ref 0–55)
AST: 42 U/L — AB (ref 5–34)
Anion Gap: 12 mEq/L — ABNORMAL HIGH (ref 3–11)
BUN: 16.7 mg/dL (ref 7.0–26.0)
CO2: 26 meq/L (ref 22–29)
Calcium: 10 mg/dL (ref 8.4–10.4)
Chloride: 100 mEq/L (ref 98–109)
Creatinine: 1.3 mg/dL (ref 0.7–1.3)
EGFR: 63 mL/min/{1.73_m2} — ABNORMAL LOW (ref >90)
GLUCOSE: 268 mg/dL — AB (ref 70–140)
POTASSIUM: 4.6 meq/L (ref 3.5–5.1)
SODIUM: 138 meq/L (ref 136–145)
TOTAL PROTEIN: 7.4 g/dL (ref 6.4–8.3)
Total Bilirubin: 0.95 mg/dL (ref 0.20–1.20)

## 2017-05-20 LAB — FERRITIN: Ferritin: 88 ng/ml (ref 22–316)
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
## 2017-05-21 LAB — TESTOSTERONE: Testosterone, Serum: 337 ng/dL (ref 264–916)

## 2017-06-05 ENCOUNTER — Encounter: Payer: Self-pay | Admitting: Medical

## 2017-06-05 ENCOUNTER — Ambulatory Visit (INDEPENDENT_AMBULATORY_CARE_PROVIDER_SITE_OTHER): Payer: BLUE CROSS/BLUE SHIELD | Admitting: Medical

## 2017-06-05 VITALS — BP 137/80 | HR 85 | Temp 97.8°F | Resp 16 | Ht 67.0 in | Wt 272.2 lb

## 2017-06-05 DIAGNOSIS — E119 Type 2 diabetes mellitus without complications: Secondary | ICD-10-CM | POA: Diagnosis not present

## 2017-06-05 DIAGNOSIS — E785 Hyperlipidemia, unspecified: Secondary | ICD-10-CM | POA: Diagnosis not present

## 2017-06-05 DIAGNOSIS — D1721 Benign lipomatous neoplasm of skin and subcutaneous tissue of right arm: Secondary | ICD-10-CM | POA: Diagnosis not present

## 2017-06-05 DIAGNOSIS — I10 Essential (primary) hypertension: Secondary | ICD-10-CM

## 2017-06-05 LAB — LIPID PANEL
CHOL/HDL RATIO: 5
Cholesterol: 219 mg/dL — ABNORMAL HIGH (ref 0–200)
HDL: 41.9 mg/dL (ref >39.00)
LDL CALC: 142 mg/dL — AB (ref 0–99)
NonHDL: 176.72
Triglycerides: 176 mg/dL — ABNORMAL HIGH (ref 0.0–149.0)
VLDL: 35.2 mg/dL (ref 0.0–40.0)

## 2017-06-05 LAB — HEMOGLOBIN A1C: Hgb A1c MFr Bld: 11 % — ABNORMAL HIGH (ref 4.6–6.5)

## 2017-06-05 LAB — COMPREHENSIVE METABOLIC PANEL
ALT: 63 U/L — ABNORMAL HIGH (ref 0–53)
AST: 36 U/L (ref 0–37)
Albumin: 4 g/dL (ref 3.5–5.2)
Alkaline Phosphatase: 48 U/L (ref 39–117)
BUN: 11 mg/dL (ref 6–23)
CALCIUM: 9.5 mg/dL (ref 8.4–10.5)
CHLORIDE: 100 meq/L (ref 96–112)
CO2: 29 mEq/L (ref 19–32)
Creatinine, Ser: 1.09 mg/dL (ref 0.40–1.50)
GFR: 74.17 mL/min (ref >60.00)
Glucose, Bld: 234 mg/dL — ABNORMAL HIGH (ref 70–99)
POTASSIUM: 4.2 meq/L (ref 3.5–5.1)
Sodium: 137 mEq/L (ref 135–145)
Total Bilirubin: 0.7 mg/dL (ref 0.2–1.2)
Total Protein: 6.7 g/dL (ref 6.0–8.3)

## 2017-06-05 MED ORDER — VARDENAFIL HCL 20 MG PO TABS: 20.0000 mg | ORAL_TABLET | Freq: Every day | ORAL | 0 refills | Status: DC | PRN

## 2017-06-05 NOTE — Progress Notes (Signed)
Subjective:    Patient ID: Kevin Wiggins, male    DOB: 20-Oct-1960, 56 y.o.   MRN: [id number]  HPI  Pt in for follow up.  Pt updates me that his dad is poor health. His dad is 350 lbs with a lot of associated health problems.  A lot of challenges helping his dad.  Pt has htn history. Pt bp borderline today. No cardiac signs or symptoms.   Pt a1c on last visit was 10. Pt is on metformin 500 mg twice daily. Pt describes most part compliant. He is walking more at work.  Pt is fasting today. He is on lipitor. Reports compliance on diet.  No cardiac or neurologic signs or symptoms.   Review of Systems  Constitutional: Negative for chills, fatigue and fever.  Respiratory: Negative for cough, chest tightness, shortness of breath and wheezing.   Cardiovascular: Negative for chest pain and palpitations.  Gastrointestinal: Negative for abdominal pain, blood in stool, diarrhea, nausea and vomiting.  Endocrine: Negative for polydipsia, polyphagia and polyuria.  Genitourinary: Negative for dysuria.  Musculoskeletal: Negative for back pain, myalgias and neck stiffness.  Skin: Negative for rash.  Neurological: Negative for dizziness, seizures, syncope, weakness and headaches.  Hematological: Negative for adenopathy. Does not bruise/bleed easily.  Psychiatric/Behavioral: Negative for behavioral problems and confusion.    Past Medical History:  Diagnosis Date  . Borderline diabetic    a. A1C 6.6 in 07/2013 - needs to f/u PCP for likely full DM.  [name] CAD (coronary artery disease)    a. 07/2013: mild nonobstructive CAD by cath 07/25/13 as part of eval for VSD/SOB.  . CKD (chronic kidney disease)   . Diabetes mellitus without complication ([place])   . Diastolic CHF ([place])    a. Acute diastolic CHF 99/3716 in the setting of perimembranous VSD. Eval underway given mod pulm HTN and RV dilation.  . ED (erectile dysfunction)   . Erythrocytosis    secondary to testosterone replacement  . GERD  (gastroesophageal reflux disease)   . H/O: hypothyroidism   . Heart murmur   . Hemochromatosis    H63D homozyous mutation  . Hyperlipidemia   . Hypertension   . Hypogonadism male   . LAFB (left anterior fascicular block)   . Leukocytosis    secondary to testosterone replacement  . Pulmonary HTN ([place])    a. 07/2013: Moderate pulmonary HTN with RV dilitation.  . RBBB   . Refusal of blood transfusions as patient is Jehovah's Witness   . Sleep apnea    does not use CPAP sleeps on stomach  . VSD (ventricular septal defect), perimembranous    a. Dx 07/2013 with significant L-R shunt.     Social History   Social History  . Marital status: Married    Spouse name: N/A  . Number of children: N/A  . Years of education: N/A   Occupational History  .  [employer]    Maintenance    Social History Main Topics  . Smoking status: Never Smoker  . Smokeless tobacco: Never Used     Comment: never used tobacco  . Alcohol use No  . Drug use: No  . Sexual activity: Not on file   Other Topics Concern  . Not on file   Social History Narrative  . No narrative on file    Past Surgical History:  Procedure Laterality Date  . CARDIAC SURGERY     Hole patched  . LEFT AND RIGHT HEART CATHETERIZATION WITH CORONARY ANGIOGRAM N/A  07/25/2013   Procedure: LEFT AND RIGHT HEART CATHETERIZATION WITH CORONARY ANGIOGRAM;  Surgeon: Jolaine Artist, MD;  Location: [facility] CATH LAB;  Service: Cardiovascular;  Laterality: N/A;  . phlebotomy    . TEE WITHOUT CARDIOVERSION N/A 07/25/2013   Procedure: TRANSESOPHAGEAL ECHOCARDIOGRAM (TEE);  Surgeon: Dorothy Spark, MD;  Location: [facility] ENDOSCOPY;  Service: Cardiovascular;  Laterality: N/A;    Family History  Problem Relation Age of Onset  . Cancer Mother   . Hypertension Father   . Congestive Heart Failure Father   . Colon cancer Paternal Aunt   . Hemochromatosis Unknown        family history    Allergies  Allergen Reactions  . Amlodipine  Other (See Comments)    Pt reports dizziness and sleepy.   . Lisinopril Other (See Comments)    cough    Current Outpatient Prescriptions on File Prior to Visit  Medication Sig Dispense Refill  . atorvastatin (LIPITOR) 20 MG tablet Take 1 tablet (20 mg total) by mouth daily. Needs office visit 30 tablet 0  . losartan (COZAAR) 50 MG tablet Take 1 tablet (50 mg total) by mouth daily. NEED OV. 90 tablet 3  . metFORMIN (GLUCOPHAGE) 500 MG tablet TAKE 2 TABLETS BY MOUTH 2 TIMES A DAY WITH A MEAL 120 tablet 0  . omeprazole (PRILOSEC) 20 MG capsule Take 20 mg by mouth daily.    . sildenafil (VIAGRA) 100 MG tablet Take 1 tablet (100 mg total) by mouth daily as needed for erectile dysfunction. 10 tablet 0  . testosterone cypionate (DEPOTESTOSTERONE CYPIONATE) 200 MG/ML injection Inject 1 mL (200 mg total) into the muscle every 14 (fourteen) days. As needed now (Patient taking differently: Inject 200 mg into the muscle every 14 (fourteen) days. As needed now) 10 mL 3  . triamcinolone ointment (KENALOG) 0.5 % Apply 1 application topically 2 (two) times daily. 30 g 0   No current facility-administered medications on file prior to visit.     BP (!) 141/88   Pulse 85   Temp 97.8 F (36.6 C) (Oral)   Resp 16   Ht 5\' 7"  (1.702 m)   Wt 272 lb 3.2 oz (123.5 kg)   SpO2 100%   BMI 42.63 kg/m       Objective:   Physical Exam  General Mental Status- Alert. General Appearance- Not in acute distress.   Skin General: Color- Normal Color. Moisture- Normal Moisture.  Neck Carotid Arteries- Normal color. Moisture- Normal Moisture. No carotid bruits. No JVD.  Chest and Lung Exam Auscultation: Breath Sounds:-Normal.  Cardiovascular Auscultation:Rythm- Regular. Murmurs & Other Heart Sounds:Auscultation of the heart reveals- No Murmurs.  Abdomen Inspection:-Inspeection Normal. Palpation/Percussion:Note:No mass. Palpation and Percussion of the abdomen reveal- Non Tender, Non Distended + BS, no  rebound or guarding.    Neurologic Cranial Nerve exam:- CN III-XII intact(No nystagmus), symmetric smile. Strength:- 5/5 equal and symmetric strength both upper and lower extremities.  See quality metrics  Right arm-Lateral aspect just above his elbow 2.5 cm lump/mass. I think it favors lipoma. Faint tenderness to palpation      Assessment & Plan:  For your diabetes will get cmp and a1c today. If sugar average not improved. Will consider adding new medication such as invokana.  For htn continue losartan. Current on 50 mg. If in future levels close to 140/90 would increase dose to 100 mg a day.  Will check you lipid panel will check levels today and might adjust dose if needed.  Follow up  in 3 months or as needed. Reminder recommend flu vaccine between mid September and mid October  Jaquaya Coyle, [place], [place]

## 2017-06-05 NOTE — Patient Instructions (Addendum)
For your diabetes will get cmp and a1c today. If sugar average not improved. Will consider adding new medication such as invokana.  For htn continue losartan. Current on 50 mg. If in future levels close to 140/90 would increase dose to 100 mg a day.  Will check you lipid panel will check levels today and might adjust dose if needed.  Follow up in 3 months or as needed. Reminder recommend flu vaccine between mid September and mid october

## 2017-06-09 ENCOUNTER — Telehealth: Payer: Self-pay

## 2017-06-09 NOTE — Telephone Encounter (Signed)
PA initiated via Covermymeds; KEY: [id number]. Awaiting determination.

## 2017-06-09 NOTE — Telephone Encounter (Signed)
HDQQIW:[id number];HERDEY:[id number];Review Type:Prior Auth;Coverage Start Date:05/10/2017;Coverage End Date:06/09/2018;;CaseId:[id number];Status:Denied;Review Type:Qty;Appeal Information: Attention:ATTN: [place] [id number]. UDJSH,FW,[id number] OYDXA:[phone] CNO:[phone];

## 2017-06-10 MED FILL — LEVITRA 20 MG TABLET: 20 | 30 days supply | Qty: 8 | Fill #0

## 2017-06-12 ENCOUNTER — Telehealth: Payer: Self-pay | Admitting: Medical

## 2017-06-12 MED FILL — LOSARTAN POTASSIUM 50 MG TA: 50 | 30 days supply | Qty: 30 | Fill #2

## 2017-06-12 NOTE — Telephone Encounter (Signed)
Pt returning call   CB: [phone]

## 2017-06-16 ENCOUNTER — Other Ambulatory Visit: Payer: Self-pay | Admitting: Medical

## 2017-06-16 IMAGING — CR DG CHEST 2V
2 series · 2 of 2 positions shown · non-contrast
Comparison: Chest radiograph dated 07/29/2013.

CLINICAL DATA: 54 y/o M; tachycardia and palpitations for several
days intermittently.

EXAM:
CHEST  2 VIEW

[chest pa]
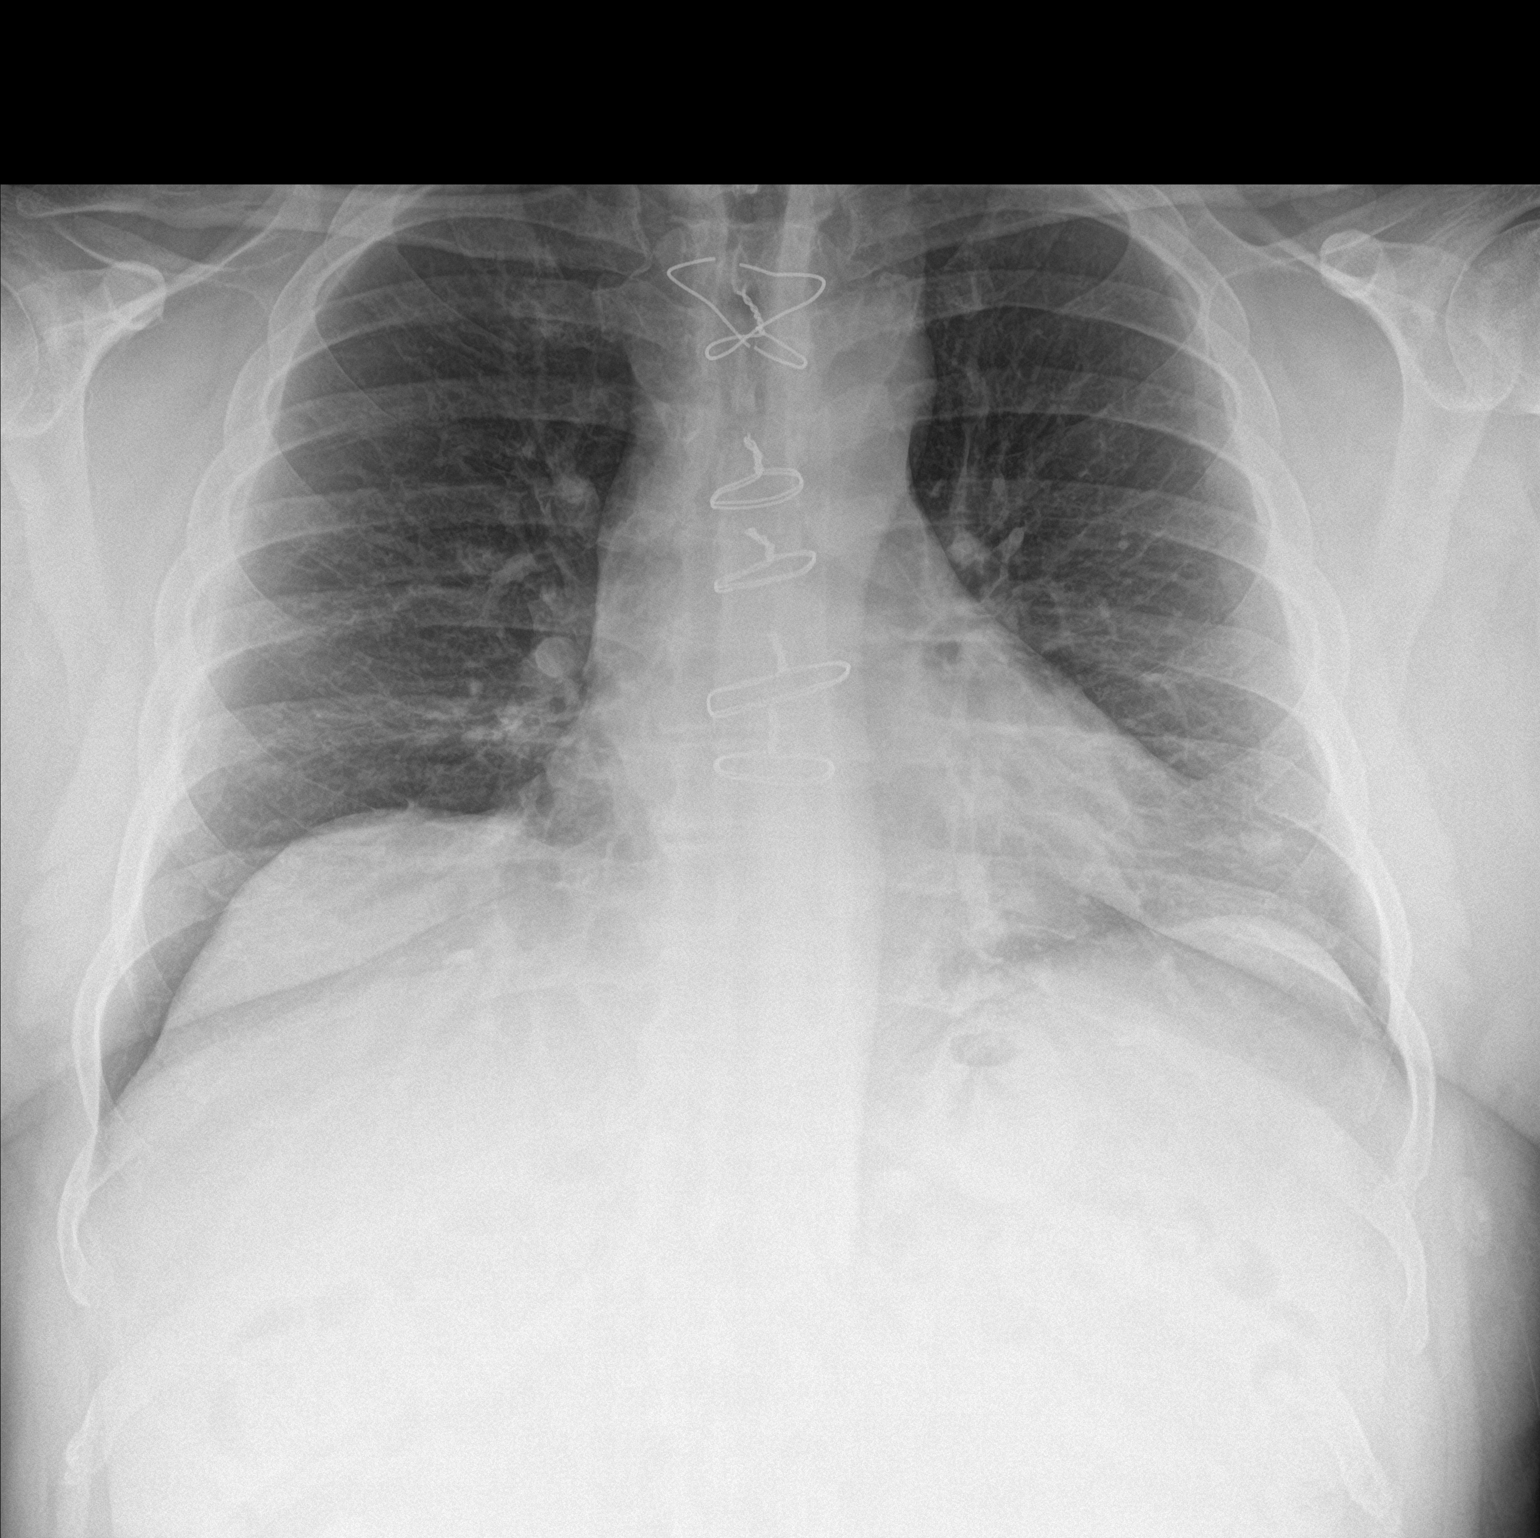

[chest lat]
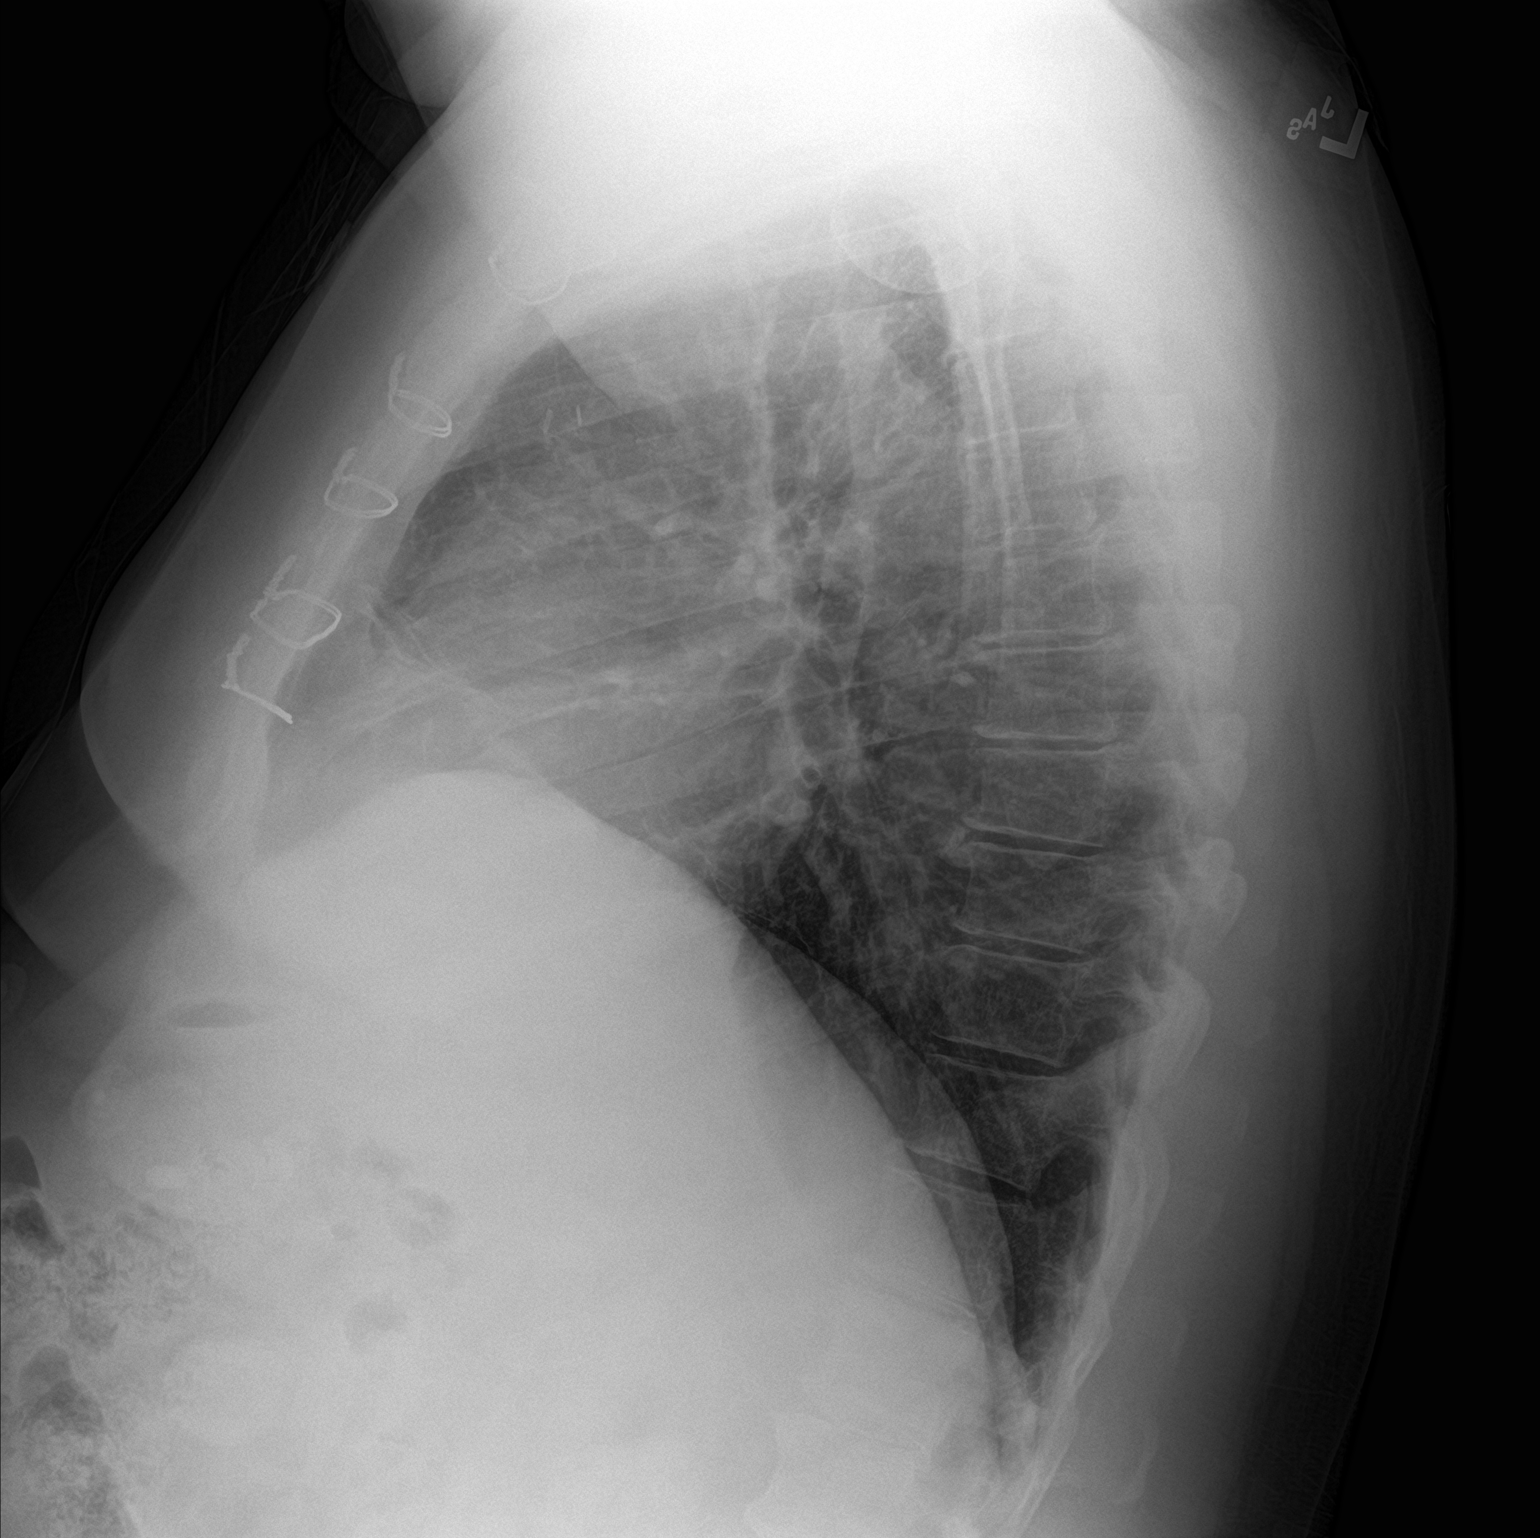

[2 of 2 positions shown; findings below may reference images not displayed]

FINDINGS: Linear opacities in left lung base probably represent atelectasis.
No pleural effusion. No pneumothorax. No pulmonary edema. Multiple
sternotomy wires of which the upper wire is broken. No acute osseous
abnormality.
IMPRESSION: 1. Minimal left basilar atelectasis. Otherwise no acute
cardiopulmonary process is evident.
2. Sternotomy wires of which the upper wire is broken.

By: Monchito Pantera M.D.

## 2017-06-16 MED FILL — metFORMIN HCL 500 MG TABS: 500 | 30 days supply | Qty: 120 | Fill #0

## 2017-07-01 ENCOUNTER — Ambulatory Visit (HOSPITAL_COMMUNITY): Payer: Self-pay | Admitting: General Surgery

## 2017-07-01 NOTE — H&P (Signed)
Kevin Wiggins 07/01/2017 11:25 AM Location: [facility] Patient #: [id number] DOB: November 27, 1960 Married / Language: [name] / Race: White Male  History of Present Illness Kevin Hollingshead MD; 07/01/2017 12:04 PM) The patient is a 56 year old male.   Note:He is referred by Mackie Pai, PA-C for consultation regarding an enlarging right upper arm mass. He noticed this in the past and is slowly getting larger. Does not cause him much pain. He is interested in having it removed. He is right-hand dominant. No tingling or numbness in his hand or forearm.  Past medical history is notable for diabetes mellitus type 2, congestive heart failure, hypertension, obstructive sleep apnea, status post repair of ventricular septal defect.  Past Surgical History (Tanisha A. Owens Shark, [place]; 07/01/2017 11:25 AM) Colon Polyp Removal - Colonoscopy Valve Replacement  Diagnostic Studies History (Tanisha A. Owens Shark, [place]; 07/01/2017 11:25 AM) Colonoscopy within last year  Allergies (Tanisha A. Owens Shark, [place]; 07/01/2017 11:27 AM) No Known Drug Allergies 07/01/2017 Allergies Reconciled  Medication History (Tanisha A. Owens Shark, [place]; 07/01/2017 11:27 AM) Atorvastatin Calcium (20MG  Tablet, Oral) Active. Levitra (20MG  Tablet, Oral) Active. Losartan Potassium (50MG  Tablet, Oral) Active. MetFORMIN HCl (500MG  Tablet, Oral) Active. Omeprazole (20MG  Capsule DR, Oral) Active. Medications Reconciled  Social History (Tanisha A. Owens Shark, [place]; 07/01/2017 11:25 AM) Alcohol use Occasional alcohol use. Caffeine use Coffee, Tea. No drug use Tobacco use Never smoker.  Family History (Tanisha A. Owens Shark, [place]; 07/01/2017 11:25 AM) Arthritis Mother. Colon Polyps Father. Diabetes Mellitus Father. Hypertension Father.  Other Problems (Tanisha A. Owens Shark, [place]; 07/01/2017 11:25 AM) Heart murmur     Review of Systems (Tanisha A. Brown RMA; 07/01/2017 11:25 AM) HEENT Not Present- Earache, Hearing Loss,  Hoarseness, Nose Bleed, Oral Ulcers, Ringing in the Ears, Seasonal Allergies, Sinus Pain, Sore Throat, Visual Disturbances, Wears glasses/contact lenses and Yellow Eyes. Respiratory Not Present- Bloody sputum, Chronic Cough, Difficulty Breathing, Snoring and Wheezing. Breast Not Present- Breast Mass, Breast Pain, Nipple Discharge and Skin Changes. Cardiovascular Not Present- Chest Pain, Difficulty Breathing Lying Down, Leg Cramps, Palpitations, Rapid Heart Rate, Shortness of Breath and Swelling of Extremities. Gastrointestinal Not Present- Abdominal Pain, Bloating, Bloody Stool, Change in Bowel Habits, Chronic diarrhea, Constipation, Difficulty Swallowing, Excessive gas, Gets full quickly at meals, Hemorrhoids, Indigestion, Nausea, Rectal Pain and Vomiting. Male Genitourinary Not Present- Blood in Urine, Change in Urinary Stream, Frequency, Impotence, Nocturia, Painful Urination, Urgency and Urine Leakage. Musculoskeletal Not Present- Back Pain, Joint Pain, Joint Stiffness, Muscle Pain, Muscle Weakness and Swelling of Extremities. Neurological Not Present- Decreased Memory, Fainting, Headaches, Numbness, Seizures, Tingling, Tremor, Trouble walking and Weakness. Psychiatric Not Present- Anxiety, Bipolar, Change in Sleep Pattern, Depression, Fearful and Frequent crying. Endocrine Not Present- Cold Intolerance, Excessive Hunger, Hair Changes, Heat Intolerance, Hot flashes and New Diabetes. Hematology Not Present- Blood Thinners, Easy Bruising, Excessive bleeding, Gland problems, HIV and Persistent Infections.  Vitals (Tanisha A. Brown RMA; 07/01/2017 11:27 AM) 07/01/2017 11:26 AM Weight: 269.4 lb Height: 69in Body Surface Area: 2.34 m Body Mass Index: 39.78 kg/m  Temp.: 97.13F  Pulse: 104 (Regular)  P.OX: 98% (Room air) BP: 132/84 (Sitting, Left Arm, Standard)      Physical Exam Kevin Hollingshead MD; 07/01/2017 12:00 PM)  The physical exam findings are as follows: Note:GENERAL  APPEARANCE: Obese male in NAD. Pleasant and cooperative.  EARS, NOSE, MOUTH THROAT: [place]/AT external ears: no lesions or deformities external nose: no lesions or deformities hearing: grossly normal lips: moist, no deformities EYES external: conjunctiva, lids, sclerae normal pupils: equal, round  RESP/CHEST: Midsternal wide scar  SKIN jaundice: none subcutaneous nodules: 4.5 cm x 4.5 cm subcutaneous right upper arm mass, lateral area, just proximal to the elbow. It is mobile and nontender. No erythema.  NEUROLOGIC speech: normal  PSYCHIATRIC alertness and orientation: normal mood/affect/behavior: normal judgement and insight: normal    Assessment & Plan Kevin Hollingshead MD; 07/01/2017 12:02 PM)  MASS OF SOFT TISSUE OF RIGHT UPPER EXTREMITY (R22.31) Impression: This is been getting larger. He is interested in removal. Blood sugars have been above 200.  Plan: Removal of subcutaneous right arm soft tissue mass. I advised him to try to get better blood sugar control as well to decrease the risk of infection and wound healing problem.  The procedure and risks- including but not limited to bleeding, infection, wound healing problems, anesthesia, recurrence, nerve injury-have been discussed. He seems to understand and would like to proceed.  Jackolyn Confer, M.D.

## 2017-07-02 ENCOUNTER — Other Ambulatory Visit: Payer: Self-pay

## 2017-07-02 MED ORDER — ATORVASTATIN CALCIUM 20 MG PO TABS: 20.0000 mg | ORAL_TABLET | Freq: Every day | ORAL | 0 refills | Status: DC

## 2017-07-02 MED FILL — ATORVASTATIN 20 MG TABLET: 20 | 30 days supply | Qty: 30 | Fill #0

## 2017-07-14 ENCOUNTER — Encounter (HOSPITAL_BASED_OUTPATIENT_CLINIC_OR_DEPARTMENT_OTHER): Payer: Self-pay | Admitting: *Deleted

## 2017-07-14 NOTE — Progress Notes (Addendum)
NPO AFTER MN.  ARRIVE AT 0530.  NEEDS ISTAT 8 AND EKG.    ADDENDUM:  REVIEWED CHART W/ DR SINGER MDA, OK TO PROCEED.

## 2017-07-15 MED FILL — LOSARTAN POTASSIUM 50 MG TA: 50 | 30 days supply | Qty: 30 | Fill #3

## 2017-07-16 NOTE — Anesthesia Preprocedure Evaluation (Addendum)
Anesthesia Evaluation  Patient identified by MRN, date of birth, ID band Patient awake    Reviewed: Allergy & Precautions, NPO status , Patient's Chart, lab work & pertinent test results  History of Anesthesia Complications Negative for: history of anesthetic complications  Airway Mallampati: II  TM Distance: >3 FB Neck ROM: Full    Dental  (+) Dental Advisory Given, Teeth Intact   Pulmonary sleep apnea (doesn not tolerate CPAP) ,    breath sounds clear to auscultation       Cardiovascular hypertension, Pt. on medications + CAD (non-obstructive by cath '14)   Rhythm:Regular Rate:Normal  '15 ECHO: E 55-60%, valves OK, no ASD, no VSD, sinus of valsalva ok s/p repair   Neuro/Psych negative neurological ROS     GI/Hepatic Neg liver ROS, GERD  Medicated and Controlled,  Endo/Other  diabetes (glu 255), Insulin DependentMorbid obesity  Renal/GU negative Renal ROS     Musculoskeletal   Abdominal (+) + obese,   Peds  Hematology  (+) JEHOVAH'S WITNESShemochromatosis   Anesthesia Other Findings   Reproductive/Obstetrics                           Anesthesia Physical Anesthesia Plan  ASA: III  Anesthesia Plan: MAC   Post-op Pain Management:    Induction:   PONV Risk Score and Plan: 1 and Ondansetron  Airway Management Planned: Natural Airway and Simple Face Mask  Additional Equipment:   Intra-op Plan:   Post-operative Plan:   Informed Consent: I have reviewed the patients History and Physical, chart, labs and discussed the procedure including the risks, benefits and alternatives for the proposed anesthesia with the patient or authorized representative who has indicated his/her understanding and acceptance.   Dental advisory given  Plan Discussed with: CRNA and Surgeon  Anesthesia Plan Comments: (Plan routine monitors, MAC)        Anesthesia Quick Evaluation

## 2017-07-17 ENCOUNTER — Ambulatory Visit (HOSPITAL_BASED_OUTPATIENT_CLINIC_OR_DEPARTMENT_OTHER): Payer: BLUE CROSS/BLUE SHIELD | Admitting: Anesthesiology

## 2017-07-17 ENCOUNTER — Encounter (HOSPITAL_BASED_OUTPATIENT_CLINIC_OR_DEPARTMENT_OTHER): Payer: Self-pay

## 2017-07-17 ENCOUNTER — Ambulatory Visit (HOSPITAL_BASED_OUTPATIENT_CLINIC_OR_DEPARTMENT_OTHER)
Admission: RE | Admit: 2017-07-17 | Discharge: 2017-07-17 | Disposition: A | Discharge status: Home / Self Care | Payer: BLUE CROSS/BLUE SHIELD | Source: Ambulatory Visit | Attending: General Surgery | Admitting: General Surgery

## 2017-07-17 ENCOUNTER — Encounter (HOSPITAL_BASED_OUTPATIENT_CLINIC_OR_DEPARTMENT_OTHER)
Admission: RE | Disposition: A | Discharge status: Home / Self Care | Payer: Self-pay | Source: Ambulatory Visit | Attending: General Surgery

## 2017-07-17 DIAGNOSIS — I2510 Atherosclerotic heart disease of native coronary artery without angina pectoris: Secondary | ICD-10-CM | POA: Diagnosis not present

## 2017-07-17 DIAGNOSIS — G4733 Obstructive sleep apnea (adult) (pediatric): Secondary | ICD-10-CM | POA: Insufficient documentation

## 2017-07-17 DIAGNOSIS — Z79899 Other long term (current) drug therapy: Secondary | ICD-10-CM | POA: Diagnosis not present

## 2017-07-17 DIAGNOSIS — Z6839 Body mass index (BMI) 39.0-39.9, adult: Secondary | ICD-10-CM | POA: Insufficient documentation

## 2017-07-17 DIAGNOSIS — E119 Type 2 diabetes mellitus without complications: Secondary | ICD-10-CM | POA: Insufficient documentation

## 2017-07-17 DIAGNOSIS — I509 Heart failure, unspecified: Secondary | ICD-10-CM | POA: Insufficient documentation

## 2017-07-17 DIAGNOSIS — D1721 Benign lipomatous neoplasm of skin and subcutaneous tissue of right arm: Secondary | ICD-10-CM | POA: Insufficient documentation

## 2017-07-17 DIAGNOSIS — Z7984 Long term (current) use of oral hypoglycemic drugs: Secondary | ICD-10-CM | POA: Insufficient documentation

## 2017-07-17 DIAGNOSIS — I110 Hypertensive heart disease with heart failure: Secondary | ICD-10-CM | POA: Insufficient documentation

## 2017-07-17 HISTORY — DX: Other specified soft tissue disorders: M79.89

## 2017-07-17 HISTORY — PX: MASS EXCISION: SHX2000

## 2017-07-17 HISTORY — DX: Obstructive sleep apnea (adult) (pediatric): G47.33

## 2017-07-17 HISTORY — DX: Type 2 diabetes mellitus with hyperglycemia: E11.65

## 2017-07-17 HISTORY — DX: Congenital aneurysm of aorta: Q25.43

## 2017-07-17 HISTORY — DX: Testicular hypofunction: E29.1

## 2017-07-17 HISTORY — DX: Secondary polycythemia: D75.1

## 2017-07-17 HISTORY — DX: Reserved for concepts with insufficient information to code with codable children: IMO0002

## 2017-07-17 HISTORY — DX: Hereditary hemochromatosis: E83.110

## 2017-07-17 HISTORY — DX: Chronic kidney disease, stage 2 (mild): N18.2

## 2017-07-17 HISTORY — DX: Localized swelling, mass and lump, right upper limb: R22.31

## 2017-07-17 LAB — POCT I-STAT, CHEM 8
BUN: 13 mg/dL (ref 6–20)
CREATININE: 0.9 mg/dL (ref 0.61–1.24)
Calcium, Ion: 1.19 mmol/L (ref 1.15–1.40)
Chloride: 100 mmol/L — ABNORMAL LOW (ref 101–111)
GLUCOSE: 255 mg/dL — AB (ref 65–99)
HEMATOCRIT: 49 % (ref 39.0–52.0)
HEMOGLOBIN: 16.7 g/dL (ref 13.0–17.0)
POTASSIUM: 4.1 mmol/L (ref 3.5–5.1)
Sodium: 139 mmol/L (ref 135–145)
TCO2: 25 mmol/L (ref 22–32)

## 2017-07-17 LAB — GLUCOSE, CAPILLARY: GLUCOSE-CAPILLARY: 248 mg/dL — AB (ref 65–99)

## 2017-07-17 SURGERY — EXCISION MASS
Anesthesia: Monitor Anesthesia Care | Site: Arm Upper | Laterality: Right

## 2017-07-17 MED ORDER — ONDANSETRON HCL 4 MG/2ML IJ SOLN
INTRAMUSCULAR | Status: DC | PRN
  Administered 2017-07-17: 4 mg via INTRAVENOUS

## 2017-07-17 MED ORDER — FENTANYL CITRATE (PF) 100 MCG/2ML IJ SOLN
25.0000 ug | INTRAMUSCULAR | Status: DC | PRN
  Filled 2017-07-17: qty 1

## 2017-07-17 MED ORDER — OXYCODONE HCL 5 MG PO TABS
5.0000 mg | ORAL_TABLET | ORAL | Status: DC | PRN
  Filled 2017-07-17: qty 2

## 2017-07-17 MED ORDER — PROPOFOL 500 MG/50ML IV EMUL
INTRAVENOUS | Status: DC | PRN
  Administered 2017-07-17: 200 ug/kg/min via INTRAVENOUS

## 2017-07-17 MED ORDER — KETAMINE HCL-SODIUM CHLORIDE 100-0.9 MG/10ML-% IV SOSY
PREFILLED_SYRINGE | INTRAVENOUS | Status: AC
  Filled 2017-07-17: qty 10

## 2017-07-17 MED ORDER — DEXAMETHASONE SODIUM PHOSPHATE 10 MG/ML IJ SOLN
INTRAMUSCULAR | Status: DC | PRN
  Administered 2017-07-17: 5 mg via INTRAVENOUS

## 2017-07-17 MED ORDER — MEPERIDINE HCL 25 MG/ML IJ SOLN
6.2500 mg | INTRAMUSCULAR | Status: DC | PRN
  Filled 2017-07-17: qty 1

## 2017-07-17 MED ORDER — SODIUM BICARBONATE 4 % IV SOLN
INTRAVENOUS | Status: DC | PRN
  Administered 2017-07-17: 5 mL via INTRAVENOUS

## 2017-07-17 MED ORDER — CEFAZOLIN SODIUM-DEXTROSE 2-4 GM/100ML-% IV SOLN
2.0000 g | INTRAVENOUS | Status: DC
  Filled 2017-07-17: qty 100

## 2017-07-17 MED ORDER — DEXTROSE 5 % IV SOLN
3.0000 g | INTRAVENOUS | Status: AC
  Administered 2017-07-17: 3 g via INTRAVENOUS
  Filled 2017-07-17 (×2): qty 3000

## 2017-07-17 MED ORDER — BUPIVACAINE HCL (PF) 0.5 % IJ SOLN
INTRAMUSCULAR | Status: DC | PRN
  Administered 2017-07-17: 30 mL

## 2017-07-17 MED ORDER — LIDOCAINE 2% (20 MG/ML) 5 ML SYRINGE
INTRAMUSCULAR | Status: DC | PRN
  Administered 2017-07-17: 25 mg via INTRAVENOUS

## 2017-07-17 MED ORDER — ACETAMINOPHEN 325 MG PO TABS
650.0000 mg | ORAL_TABLET | ORAL | Status: DC | PRN
  Filled 2017-07-17: qty 2

## 2017-07-17 MED ORDER — TRAMADOL HCL 50 MG PO TABS: 50.0000 mg | ORAL_TABLET | Freq: Four times a day (QID) | ORAL | 0 refills | Status: DC | PRN

## 2017-07-17 MED ORDER — LIDOCAINE-EPINEPHRINE (PF) 1 %-1:200000 IJ SOLN
INTRAMUSCULAR | Status: DC | PRN
  Administered 2017-07-17: 30 mL

## 2017-07-17 MED ORDER — SODIUM CHLORIDE 0.9 % IV SOLN
INTRAVENOUS | Status: DC | PRN
  Administered 2017-07-17: 20 ug/kg/min via INTRAVENOUS

## 2017-07-17 MED ORDER — KETOROLAC TROMETHAMINE 30 MG/ML IJ SOLN
INTRAMUSCULAR | Status: AC
  Filled 2017-07-17: qty 1

## 2017-07-17 MED ORDER — FENTANYL CITRATE (PF) 100 MCG/2ML IJ SOLN
INTRAMUSCULAR | Status: AC
  Filled 2017-07-17: qty 2

## 2017-07-17 MED ORDER — CHLORHEXIDINE GLUCONATE CLOTH 2 % EX PADS
6.0000 | MEDICATED_PAD | Freq: Once | CUTANEOUS | Status: DC
  Filled 2017-07-17: qty 6

## 2017-07-17 MED ORDER — PROPOFOL 10 MG/ML IV BOLUS
INTRAVENOUS | Status: AC
  Filled 2017-07-17: qty 40

## 2017-07-17 MED ORDER — SODIUM CHLORIDE 0.9% FLUSH
3.0000 mL | INTRAVENOUS | Status: DC | PRN
  Filled 2017-07-17: qty 3

## 2017-07-17 MED ORDER — MIDAZOLAM HCL 2 MG/2ML IJ SOLN
0.5000 mg | Freq: Once | INTRAMUSCULAR | Status: DC | PRN
  Filled 2017-07-17: qty 2

## 2017-07-17 MED ORDER — LACTATED RINGERS IV SOLN
INTRAVENOUS | Status: DC
  Administered 2017-07-17: 06:00:00 via INTRAVENOUS
  Filled 2017-07-17: qty 1000

## 2017-07-17 MED ORDER — FENTANYL CITRATE (PF) 100 MCG/2ML IJ SOLN
INTRAMUSCULAR | Status: DC | PRN
  Administered 2017-07-17: 50 ug via INTRAVENOUS

## 2017-07-17 MED ORDER — MIDAZOLAM HCL 2 MG/2ML IJ SOLN
INTRAMUSCULAR | Status: DC | PRN
  Administered 2017-07-17: 2 mg via INTRAVENOUS

## 2017-07-17 MED ORDER — MIDAZOLAM HCL 2 MG/2ML IJ SOLN
INTRAMUSCULAR | Status: AC
  Filled 2017-07-17: qty 2

## 2017-07-17 MED ORDER — ACETAMINOPHEN 650 MG RE SUPP
650.0000 mg | RECTAL | Status: DC | PRN
  Filled 2017-07-17: qty 1

## 2017-07-17 MED ORDER — PROMETHAZINE HCL 25 MG/ML IJ SOLN
6.2500 mg | INTRAMUSCULAR | Status: DC | PRN
  Filled 2017-07-17: qty 1

## 2017-07-17 SURGICAL SUPPLY — 53 items
BENZOIN TINCTURE PRP APPL 2/3 (GAUZE/BANDAGES/DRESSINGS) ×3 IMPLANT
BLADE CLIPPER SURG (BLADE) IMPLANT
BLADE SURG 10 STRL SS (BLADE) ×3 IMPLANT
BLADE SURG 15 STRL LF DISP TIS (BLADE) IMPLANT
BLADE SURG 15 STRL SS (BLADE)
BNDG GAUZE ELAST 4 BULKY (GAUZE/BANDAGES/DRESSINGS) ×3 IMPLANT
CANISTER SUCT 1200ML W/VALVE (MISCELLANEOUS) IMPLANT
CHLORAPREP W/TINT 26ML (MISCELLANEOUS) ×3 IMPLANT
CLEANER CAUTERY TIP 5X5 PAD (MISCELLANEOUS) ×1 IMPLANT
CLOSURE WOUND 1/2 X4 (GAUZE/BANDAGES/DRESSINGS) ×1
COVER BACK TABLE 60X90IN (DRAPES) ×3 IMPLANT
COVER MAYO STAND STRL (DRAPES) ×3 IMPLANT
DECANTER SPIKE VIAL GLASS SM (MISCELLANEOUS) IMPLANT
DERMABOND ADVANCED (GAUZE/BANDAGES/DRESSINGS)
DERMABOND ADVANCED .7 DNX12 (GAUZE/BANDAGES/DRESSINGS) IMPLANT
DRAPE EXTREMITY T 121X128X90 (DRAPE) ×3 IMPLANT
DRAPE LAPAROTOMY 100X72 PEDS (DRAPES) IMPLANT
DRAPE UTILITY XL STRL (DRAPES) ×3 IMPLANT
DRSG TEGADERM 4X4.75 (GAUZE/BANDAGES/DRESSINGS) IMPLANT
ELECT REM PT RETURN 9FT ADLT (ELECTROSURGICAL) ×3
ELECTRODE REM PT RTRN 9FT ADLT (ELECTROSURGICAL) ×1 IMPLANT
GAUZE SPONGE 4X4 16PLY XRAY LF (GAUZE/BANDAGES/DRESSINGS) IMPLANT
GAUZE SPONGE 4X4 8PLY STR LF (GAUZE/BANDAGES/DRESSINGS) ×3 IMPLANT
GLOVE BIO SURGEON STRL SZ 6.5 (GLOVE) ×2 IMPLANT
GLOVE BIO SURGEONS STRL SZ 6.5 (GLOVE) ×1
GLOVE BIOGEL PI IND STRL 6.5 (GLOVE) ×1 IMPLANT
GLOVE BIOGEL PI IND STRL 7.5 (GLOVE) ×1 IMPLANT
GLOVE BIOGEL PI IND STRL 8.5 (GLOVE) ×1 IMPLANT
GLOVE BIOGEL PI INDICATOR 6.5 (GLOVE) ×2
GLOVE BIOGEL PI INDICATOR 7.5 (GLOVE) ×2
GLOVE BIOGEL PI INDICATOR 8.5 (GLOVE) ×2
GLOVE ECLIPSE 8.0 STRL XLNG CF (GLOVE) ×3 IMPLANT
GOWN STRL REUS W/ TWL LRG LVL3 (GOWN DISPOSABLE) IMPLANT
GOWN STRL REUS W/TWL LRG LVL3 (GOWN DISPOSABLE) ×3 IMPLANT
GOWN STRL REUS W/TWL XL LVL3 (GOWN DISPOSABLE) ×3 IMPLANT
NEEDLE HYPO 25X1 1.5 SAFETY (NEEDLE) ×3 IMPLANT
NS IRRIG 500ML POUR BTL (IV SOLUTION) ×3 IMPLANT
PACK BASIN DAY SURGERY FS (CUSTOM PROCEDURE TRAY) ×3 IMPLANT
PAD CLEANER CAUTERY TIP 5X5 (MISCELLANEOUS) ×2
PENCIL BUTTON HOLSTER BLD 10FT (ELECTRODE) ×3 IMPLANT
SPONGE GAUZE 4X4 12PLY STER LF (GAUZE/BANDAGES/DRESSINGS) IMPLANT
STRIP CLOSURE SKIN 1/2X4 (GAUZE/BANDAGES/DRESSINGS) ×2 IMPLANT
SUT MNCRL AB 3-0 PS2 18 (SUTURE) ×3 IMPLANT
SUT MON AB 4-0 PC3 18 (SUTURE) IMPLANT
SUT PROLENE 2 0 CT2 30 (SUTURE) IMPLANT
SUT VIC AB 4-0 SH 18 (SUTURE) IMPLANT
SUT VIC AB 4-0 SH 27 (SUTURE)
SUT VIC AB 4-0 SH 27XANBCTRL (SUTURE) IMPLANT
SYR CONTROL 10ML LL (SYRINGE) ×3 IMPLANT
TOWEL OR 17X24 6PK STRL BLUE (TOWEL DISPOSABLE) ×6 IMPLANT
TUBE CONNECTING 12'X1/4 (SUCTIONS)
TUBE CONNECTING 12X1/4 (SUCTIONS) IMPLANT
YANKAUER SUCT BULB TIP NO VENT (SUCTIONS) IMPLANT

## 2017-07-17 NOTE — Anesthesia Postprocedure Evaluation (Signed)
Anesthesia Post Note  Patient: Kevin Wiggins  Procedure(s) Performed: REMOVAL OF RIGHT UPPER ARM SOFT TISSUE MASS (Right Arm Upper)     Patient location during evaluation: PACU Anesthesia Type: MAC Level of consciousness: awake and alert, patient cooperative and oriented Pain management: pain level controlled Vital Signs Assessment: post-procedure vital signs reviewed and stable Respiratory status: nonlabored ventilation, spontaneous breathing and respiratory function stable Cardiovascular status: blood pressure returned to baseline and stable Postop Assessment: adequate PO intake and no apparent nausea or vomiting Anesthetic complications: no    Last Vitals:  Vitals:   07/17/17 0915 07/17/17 0950  BP: (!) 142/85 (!) 146/81  Pulse: 78 76  Resp:  18  Temp:  36.8 C  SpO2: 96% 96%    Last Pain:  Vitals:   07/17/17 0950  TempSrc: Oral                 Quanita Barona,E. Kamuela Magos

## 2017-07-17 NOTE — H&P (View-Only) (Signed)
Kevin Wiggins 07/01/2017 11:25 AM Location: [facility] Patient #: [id number] DOB: 1961/06/08 Married / Language: Kevin Wiggins / Race: White Male  History of Present Illness Kevin Hollingshead MD; 07/01/2017 12:04 PM) The patient is a 56 year old male.   Note:He is referred by Kevin Pai, PA-C for consultation regarding an enlarging right upper arm mass. He noticed this in the past and is slowly getting larger. Does not cause him much pain. He is interested in having it removed. He is right-hand dominant. No tingling or numbness in his hand or forearm.  Past medical history is notable for diabetes mellitus type 2, congestive heart failure, hypertension, obstructive sleep apnea, status post repair of ventricular septal defect.  Past Surgical History (Kevin Wiggins, [place]; 07/01/2017 11:25 AM) Colon Polyp Removal - Colonoscopy Valve Replacement  Diagnostic Studies History (Kevin Wiggins, [place]; 07/01/2017 11:25 AM) Colonoscopy within last year  Allergies (Kevin Wiggins, [place]; 07/01/2017 11:27 AM) No Known Drug Allergies 07/01/2017 Allergies Reconciled  Medication History (Kevin Wiggins, [place]; 07/01/2017 11:27 AM) Atorvastatin Calcium (20MG  Tablet, Oral) Active. Levitra (20MG  Tablet, Oral) Active. Losartan Potassium (50MG  Tablet, Oral) Active. MetFORMIN HCl (500MG  Tablet, Oral) Active. Omeprazole (20MG  Capsule DR, Oral) Active. Medications Reconciled  Social History (Kevin Wiggins, [place]; 07/01/2017 11:25 AM) Alcohol use Occasional alcohol use. Caffeine use Coffee, Tea. No drug use Tobacco use Never smoker.  Family History (Kevin Wiggins, [place]; 07/01/2017 11:25 AM) Arthritis Mother. Colon Polyps Father. Diabetes Mellitus Father. Hypertension Father.  Other Problems (Kevin Wiggins, [place]; 07/01/2017 11:25 AM) Heart murmur     Review of Systems (Kevin Wiggins; 07/01/2017 11:25 AM) HEENT Not Present- Earache, Hearing Loss,  Hoarseness, Nose Bleed, Oral Ulcers, Ringing in the Ears, Seasonal Allergies, Sinus Pain, Sore Throat, Visual Disturbances, Wears glasses/contact lenses and Yellow Eyes. Respiratory Not Present- Bloody sputum, Chronic Cough, Difficulty Breathing, Snoring and Wheezing. Breast Not Present- Breast Mass, Breast Pain, Nipple Discharge and Skin Changes. Cardiovascular Not Present- Chest Pain, Difficulty Breathing Lying Down, Leg Cramps, Palpitations, Rapid Heart Rate, Shortness of Breath and Swelling of Extremities. Gastrointestinal Not Present- Abdominal Pain, Bloating, Bloody Stool, Change in Bowel Habits, Chronic diarrhea, Constipation, Difficulty Swallowing, Excessive gas, Gets full quickly at meals, Hemorrhoids, Indigestion, Nausea, Rectal Pain and Vomiting. Male Genitourinary Not Present- Blood in Urine, Change in Urinary Stream, Frequency, Impotence, Nocturia, Painful Urination, Urgency and Urine Leakage. Musculoskeletal Not Present- Back Pain, Joint Pain, Joint Stiffness, Muscle Pain, Muscle Weakness and Swelling of Extremities. Neurological Not Present- Decreased Memory, Fainting, Headaches, Numbness, Seizures, Tingling, Tremor, Trouble walking and Weakness. Psychiatric Not Present- Anxiety, Bipolar, Change in Sleep Pattern, Depression, Fearful and Frequent crying. Endocrine Not Present- Cold Intolerance, Excessive Hunger, Hair Changes, Heat Intolerance, Hot flashes and New Diabetes. Hematology Not Present- Blood Thinners, Easy Bruising, Excessive bleeding, Gland problems, HIV and Persistent Infections.  Vitals (Kevin Wiggins; 07/01/2017 11:27 AM) 07/01/2017 11:26 AM Weight: 269.4 lb Height: 69in Body Surface Area: 2.34 m Body Mass Index: 39.78 kg/m  Temp.: 97.71F  Pulse: 104 (Regular)  P.OX: 98% (Room air) BP: 132/84 (Sitting, Left Arm, Standard)      Physical Exam Kevin Hollingshead MD; 07/01/2017 12:00 PM)  The physical exam findings are as follows: Note:GENERAL  APPEARANCE: Obese male in NAD. Pleasant and cooperative.  EARS, NOSE, MOUTH THROAT: [name] external ears: no lesions or deformities external nose: no lesions or deformities hearing: grossly normal lips: moist, no deformities EYES external: conjunctiva, lids, sclerae normal pupils: equal, round  RESP/CHEST: Midsternal wide scar  SKIN jaundice: none subcutaneous nodules: 4.5 cm x 4.5 cm subcutaneous right upper arm mass, lateral area, just proximal to the elbow. It is mobile and nontender. No erythema.  NEUROLOGIC speech: normal  PSYCHIATRIC alertness and orientation: normal mood/affect/behavior: normal judgement and insight: normal    Assessment & Plan Kevin Hollingshead MD; 07/01/2017 12:02 PM)  MASS OF SOFT TISSUE OF RIGHT UPPER EXTREMITY (R22.31) Impression: This is been getting larger. He is interested in removal. Blood sugars have been above 200.  Plan: Removal of subcutaneous right arm soft tissue mass. I advised him to try to get better blood sugar control as well to decrease the risk of infection and wound healing problem.  The procedure and risks- including but not limited to bleeding, infection, wound healing problems, anesthesia, recurrence, nerve injury-have been discussed. He seems to understand and would like to proceed.  Kevin Wiggins, M.D.

## 2017-07-17 NOTE — Anesthesia Procedure Notes (Signed)
Procedure Name: MAC Date/Time: 07/17/2017 7:40 AM Performed by: Wanita Chamberlain Pre-anesthesia Checklist: Patient identified, Emergency Drugs available, Suction available, Patient being monitored and Timeout performed Patient Re-evaluated:Patient Re-evaluated prior to induction Oxygen Delivery Method: Nasal cannula Induction Type: IV induction

## 2017-07-17 NOTE — Interval H&P Note (Signed)
History and Physical Interval Note:  07/17/2017 7:27 AM  Kevin Wiggins  has presented today for surgery, with the diagnosis of RIGHT ARM SOFT TISSUE MASS  The various methods of treatment have been discussed with the patient and family. After consideration of risks, benefits and other options for treatment, the patient has consented to  Procedure(s): REMOVAL OF RIGHT ARM SOFT TISSUE MASS (N/A) as a surgical intervention .  The patient's history has been reviewed, patient examined, no change in status, stable for surgery.  I have reviewed the patient's chart and labs.  Questions were answered to the patient's satisfaction.     Zandon Talton Lenna Sciara

## 2017-07-17 NOTE — Transfer of Care (Signed)
Immediate Anesthesia Transfer of Care Note  Patient: Kevin Wiggins  Procedure(s) Performed: REMOVAL OF RIGHT ARM SOFT TISSUE MASS (Right Arm Upper)  Patient Location: PACU  Anesthesia Type:MAC  Level of Consciousness: awake, alert , oriented and patient cooperative  Airway & Oxygen Therapy: Patient Spontanous Breathing and Patient connected to nasal cannula oxygen  Post-op Assessment: Report given to RN and Post -op Vital signs reviewed and stable  Post vital signs: Reviewed and stable  Last Vitals:  Vitals:   07/17/17 0550  BP: (!) 155/87  Pulse: 85  Resp: 16  Temp: 37.1 C  SpO2: 96%    Last Pain:  Vitals:   07/17/17 0550  TempSrc: Oral      Patients Stated Pain Goal: 7 (55/20/80 2233)  Complications: No apparent anesthesia complications

## 2017-07-17 NOTE — Discharge Instructions (Signed)
Call your surgeon if you experience:   1.  Fever over 101.0. 2.  Inability to urinate. 3.  Nausea and/or vomiting. 4.  Extreme swelling or bruising at the surgical site. 5.  Continued bleeding from the incision. 6.  Increased pain, redness or drainage from the incision. 7.  Problems related to your pain medication. 8.  Any problems and/or concerns  Keep the bandage dry for 3 days. Remove bandage Monday.  Apply ice to the area for the next 2 days.  Light activities with right arm for 1 week.  Call for any wound problems or heavy bleeding.  May return to work Monday.   Post Anesthesia Home Care Instructions  Activity: Get plenty of rest for the remainder of the day. A responsible individual must stay with you for 24 hours following the procedure.  For the next 24 hours, DO NOT: -Drive a car -Paediatric nurse -Drink alcoholic beverages -Take any medication unless instructed by your physician -Make any legal decisions or sign important papers.  Meals: Start with liquid foods such as gelatin or soup. Progress to regular foods as tolerated. Avoid greasy, spicy, heavy foods. If nausea and/or vomiting occur, drink only clear liquids until the nausea and/or vomiting subsides. Call your physician if vomiting continues.  Special Instructions/Symptoms: Your throat may feel dry or sore from the anesthesia or the breathing tube placed in your throat during surgery. If this causes discomfort, gargle with warm salt water. The discomfort should disappear within 24 hours.  If you had a scopolamine patch placed behind your ear for the management of post- operative nausea and/or vomiting:  1. The medication in the patch is effective for 72 hours, after which it should be removed.  Wrap patch in a tissue and discard in the trash. Wash hands thoroughly with soap and water. 2. You may remove the patch earlier than 72 hours if you experience unpleasant side effects which may include dry mouth,  dizziness or visual disturbances. 3. Avoid touching the patch. Wash your hands with soap and water after contact with the patch.

## 2017-07-17 NOTE — Op Note (Signed)
Operative Note  Kevin Wiggins male 56 y.o. 07/17/2017  PREOPERATIVE DX:  Enlarging right upper arm soft tissue mass  POSTOPERATIVE DX:  Same  PROCEDURE:   Excision of 6 cm x 4.5 cm subcutaneous soft tissue mass right upper arm         Surgeon: [name]   Assistants: none  Anesthesia: Monitored Local Anesthesia with Sedation  Indications:   This is a 56 year old right-hand dominant male who's had an enlarging subcutaneous mass just proximal to the right elbow. He now presents for removal.    Procedure Detail:  He was seen in the holding area and my initials were placed on the skin near the mass. He is brought to the operating room placed supine on the operating table and intravenous sedation was given. The right upper arm down to the mid forearm was sterilely prepped and draped. A timeout was performed.  Local anesthetic (mixture of 1% Xylocaine with epinephrine, half percent plain Marcaine, sodium bicarbonate) was injected in a longitudinal fashion in the skin over the mass.  Longitudinal incision was made over the mass in the right upper arm sharply and electrocautery used to divide some of the subcutaneous tissue. The mass was identified and appeared lipomatous in nature. The mass was densely adherent to some the subcutaneous tissues media and laterally and this was separated bluntly and with sharp dissection. The mass was also densely densely adherent to the triceps fascia and I carefully manipulated this off the fascia with sharp and blunt dissection.  The masses and separated from the rest of the subcutaneous tissue and removed. It measured 6 cm gth by 4.5 semirigid with. It was sent to pathology.  The wound was inspected and hemostasis was adequate. The wound was then closed in 2 layers. A running 3-0 Monocryl stitch was used to approximate the subcutaneous tissues. The skin was closed with running 3-0 Monocryl subcuticular stitch. Steri-Strips and a sterile dressing were  applied.  He tolerated the procedure well without any paracolic complications. He was taken to the recovery room in satisfactory condition.  Estimated Blood Loss:  less than 50 mL         Specimens: right upper arm subcutaneous soft tissue mass        Complications:  * No complications entered in OR log *         Disposition: PACU - hemodynamically stable.         Condition: stable

## 2017-07-20 ENCOUNTER — Encounter (HOSPITAL_BASED_OUTPATIENT_CLINIC_OR_DEPARTMENT_OTHER): Payer: Self-pay | Admitting: General Surgery

## 2017-07-23 ENCOUNTER — Other Ambulatory Visit: Payer: Self-pay | Admitting: Medical

## 2017-07-24 NOTE — Telephone Encounter (Signed)
Pt came in office wanting to know the status of his rx since pt states has only two pills left and would like to have his rx sent to pharmacy ASAP.

## 2017-07-29 MED FILL — metFORMIN HCL 500 MG TABS: 500 | 30 days supply | Qty: 120 | Fill #0

## 2017-07-29 NOTE — Telephone Encounter (Signed)
rx sent to pharmacy

## 2017-07-30 ENCOUNTER — Other Ambulatory Visit: Payer: Self-pay | Admitting: Medical

## 2017-07-31 MED FILL — ATORVASTATIN 20 MG TABLET: 20 | 30 days supply | Qty: 30 | Fill #0

## 2017-07-31 NOTE — Telephone Encounter (Signed)
Pt due for follow up please call and schedule appointment.  

## 2017-08-12 MED FILL — LOSARTAN POTASSIUM 50 MG TA: 50 | 30 days supply | Qty: 30 | Fill #4

## 2017-08-19 ENCOUNTER — Ambulatory Visit (INDEPENDENT_AMBULATORY_CARE_PROVIDER_SITE_OTHER): Payer: BLUE CROSS/BLUE SHIELD | Admitting: Medical

## 2017-08-19 ENCOUNTER — Encounter: Payer: Self-pay | Admitting: Medical

## 2017-08-19 ENCOUNTER — Telehealth: Payer: Self-pay

## 2017-08-19 VITALS — BP 143/90 | HR 85 | Temp 98.7°F | Resp 16 | Ht 69.0 in | Wt 265.6 lb

## 2017-08-19 DIAGNOSIS — E785 Hyperlipidemia, unspecified: Secondary | ICD-10-CM | POA: Diagnosis not present

## 2017-08-19 DIAGNOSIS — E119 Type 2 diabetes mellitus without complications: Secondary | ICD-10-CM

## 2017-08-19 DIAGNOSIS — I10 Essential (primary) hypertension: Secondary | ICD-10-CM | POA: Diagnosis not present

## 2017-08-19 MED ORDER — ATORVASTATIN CALCIUM 20 MG PO TABS: 20.0000 mg | ORAL_TABLET | Freq: Every day | ORAL | 0 refills | Status: DC

## 2017-08-19 MED ORDER — LOSARTAN POTASSIUM 100 MG PO TABS: 100.0000 mg | ORAL_TABLET | Freq: Every day | ORAL | 0 refills | Status: DC

## 2017-08-19 MED ORDER — LIRAGLUTIDE 18 MG/3ML ~~LOC~~ SOPN: PEN_INJECTOR | SUBCUTANEOUS | 3 refills | Status: DC

## 2017-08-19 MED FILL — LOSARTAN POTASSIUM 100 MG T: 100 | 30 days supply | Qty: 30 | Fill #0

## 2017-08-19 NOTE — Patient Instructions (Signed)
For your diabetes, I want you to start the victoza daily as instructed and continue metformin.  I do think it is a good idea to check your blood sugars fasting in the morning and after eating once daily.  Add additional blood sugar check if you feel symptomatic.  If your blood sugars are coming down very quickly then would not advance the dose increase of the victoza.  For high blood pressure increasing losartan 100 mg daily..  I refilled your Lipitor today.  Early in December I want you to get fasting lipid panel, Y8X and metabolic panel.  Follow-up date to be determined after lab review.

## 2017-08-19 NOTE — Telephone Encounter (Signed)
PA initiated via Covermymeds; KEY: [id number]. Awaiting determination.

## 2017-08-19 NOTE — Progress Notes (Signed)
Subjective:    Patient ID: Kevin Wiggins, male    DOB: 1961/09/27, 56 y.o.   MRN: [id number]  HPI  Patient is in today for follow-up.  He updates me that he got the lipoma removed from his right upper extremity and feels well in that area.  No complications.  Patient is diabetic and on metformin 500 twice daily twice daily.  His last A1c was 11 which is blood sugar average of approximately 270.  Looks like staff never was able to talk with him about my recommendations.  So I discussed with him that Victoza injection would be a good addition to his current treatment plan.  Patient is okay with trying this.  Patient's lipid panel was elevated on the last visit.  He attributes this to the fact that he ran out of Lipitor prior to testing and was having difficult time refilling the medication.  He has been on Lipitor consistently since the last blood draw.  Patient blood pressure is borderline today.  Patient is aware that tighter control would be beneficial. Review of Systems  Constitutional: Negative for chills, fatigue and fever.  Respiratory: Negative for cough, choking, shortness of breath and wheezing.   Cardiovascular: Negative for chest pain and palpitations.  Gastrointestinal: Negative for abdominal pain, constipation, diarrhea, nausea and vomiting.  Endocrine: Negative for polydipsia and polyphagia.  Genitourinary: Negative for dysuria, flank pain, frequency, hematuria, testicular pain and urgency.  Musculoskeletal: Negative for arthralgias, gait problem and neck stiffness.  Skin: Negative for rash.  Neurological: Negative for dizziness, speech difficulty, weakness, numbness and headaches.  Hematological: Negative for adenopathy. Does not bruise/bleed easily.  Psychiatric/Behavioral: Negative for behavioral problems, confusion, sleep disturbance and suicidal ideas.    Past Medical History:  Diagnosis Date  . CAD (coronary artery disease) cardiologist-  dr dalton mclean   a.  07/2013: mild nonobstructive CAD (30% RCA)  by cath 07/25/13 as part of eval for VSD/SOB.  . CKD (chronic kidney disease), stage II   . Diastolic CHF ([place])    a. Acute diastolic CHF 38/2505 in the setting of perimembranous VSD. Eval underway given mod pulm HTN and RV dilation.  . ED (erectile dysfunction)   . GERD (gastroesophageal reflux disease)   . Heart murmur   . Hereditary hemochromatosis [facility]) hemotologist/ oncologist-  dr Marin Olp   H63D Homozyous mutation--  treatment phlebotomy (last one 12-03-2016) for HCT < 48%  . Hyperlipidemia   . Hypertension   . LAFB (left anterior fascicular block)   . Mass of soft tissue of right upper extremity   . OSA (obstructive sleep apnea)    severe osa per study 11-17-2013 --  no cpap/  per pt uses Breath Right strips  . RBBB   . Refusal of blood transfusions as patient is Jehovah's Witness   . 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 Secondary erythrocytosis    due to testosterone therapy  . Secondary testicular hypogonadism    due to hemochromatosis  . Type 2 diabetes mellitus, uncontrolled ([place])    followed by pcp--- last A1c 11.0 on 06-05-2017/  on 07-14-2017 pt stated does not check CBGs, asked pt about A1c being high he stated "doctor told him to watch carbs and did not add medication"     Social History   Socioeconomic History  . Marital status: Married  Spouse name: Not on file  . Number of children: Not on file  . Years of education: Not on file  . Highest education level: Not on file  Social Needs  . Financial resource strain: Not on file  . Food insecurity - worry: Not on file  . Food insecurity - inability: Not on file  . Transportation needs - medical: Not on file  . Transportation needs - non-medical: Not on file  Occupational History    Employer: [employer]    Comment: Maintenance   Tobacco  Use  . Smoking status: Never Smoker  . Smokeless tobacco: Never Used  Substance and Sexual Activity  . Alcohol use: No    Alcohol/week: 0.0 oz  . Drug use: No  . Sexual activity: Not on file  Other Topics Concern  . Not on file  Social History Narrative  . Not on file    Past Surgical History:  Procedure Laterality Date  . REPAIR SINUS VALSALVA ANEURYSM W/ CPB (STERNOTOMY)  08-26-2013   @[facility]    "patched"  . TRANSTHORACIC ECHOCARDIOGRAM  10-27-2013   dr Aundra Dubin   mild LVH, ef 55-60%/  mild LAE/  sinuses of valsalva appear normal/  PASP could not be accurately estimated (per dr Aundra Dubin stable s/p sinus of valsalva fistula repair)    Family History  Problem Relation Age of Onset  . Cancer Mother   . Hypertension Father   . Congestive Heart Failure Father   . Colon cancer Paternal Aunt   . Hemochromatosis Unknown        family history    Allergies  Allergen Reactions  . Amlodipine Other (See Comments)    Pt reports dizziness and sleepy.   . Lisinopril Other (See Comments)    cough    Current Outpatient Medications on File Prior to Visit  Medication Sig Dispense Refill  . metFORMIN (GLUCOPHAGE) 500 MG tablet TAKE 2 TABLETS BY MOUTH 2 TIMES A DAY WITH A MEAL 120 tablet 0  . omeprazole (PRILOSEC) 20 MG capsule Take 20 mg by mouth every evening.     . testosterone cypionate (DEPOTESTOSTERONE CYPIONATE) 200 MG/ML injection Inject 1 mL (200 mg total) into the muscle every 14 (fourteen) days. As needed now (Patient taking differently: Inject 200 mg into the muscle every 14 (fourteen) days. As needed now) 10 mL 3  . traMADol (ULTRAM) 50 MG tablet Take 1-2 tablets (50-100 mg total) by mouth every 6 (six) hours as needed. 30 tablet 0  . vardenafil (LEVITRA) 20 MG tablet Take 1 tablet (20 mg total) by mouth daily as needed for erectile dysfunction. (Patient taking differently: Take 20 mg by mouth daily as needed for erectile dysfunction. ) 10 tablet 0   No current facility-administered  medications on file prior to visit.     BP (!) 143/90   Pulse 85   Temp 98.7 F (37.1 C) (Oral)   Resp 16   Ht 5\' 9"  (1.753 m)   Wt 265 lb 9.6 oz (120.5 kg)   SpO2 100%   BMI 39.22 kg/m       Objective:   Physical Exam  General Mental Status- Alert. General Appearance- Not in acute distress.   Skin General: Color- Normal Color. Moisture- Normal Moisture.  Neck Carotid Arteries- Normal color. Moisture- Normal Moisture. No carotid bruits. No JVD.  Chest and Lung Exam Auscultation: Breath Sounds:-Normal.  Cardiovascular Auscultation:Rythm- Regular. Murmurs & Other Heart Sounds:Auscultation of the heart reveals- No Murmurs.  Abdomen Inspection:-Inspeection Normal. Palpation/Percussion:Note:No mass. Palpation  and Percussion of the abdomen reveal- Non Tender, Non Distended + BS, no rebound or guarding.    Neurologic Cranial Nerve exam:- CN III-XII intact(No nystagmus), symmetric smile. Strength:- 5/5 equal and symmetric strength both upper and lower extremities.      Assessment & Plan:  For your diabetes, I want you to start the victoza daily as instructed and continue metformin.  I do think it is a good idea to check your blood sugars fasting in the morning and after eating once daily.  Add additional blood sugar check if you feel symptomatic.  If your blood sugars are coming down very quickly then would not advance the dose increase of the victoza.  For high blood pressure increasing losartan 100 mg daily..  I refilled your Lipitor today.  Early in December I want you to get fasting lipid panel, W1U and metabolic panel.  Follow-up date to be determined after lab review.  Izabell Schalk, Percell Miller, PA-C

## 2017-08-20 ENCOUNTER — Ambulatory Visit: Payer: BLUE CROSS/BLUE SHIELD | Admitting: Family

## 2017-08-20 ENCOUNTER — Other Ambulatory Visit: Payer: BLUE CROSS/BLUE SHIELD

## 2017-08-20 MED FILL — PENTIPS 31G X 5 MM MISC: 31G X 5 MM | 30 days supply | Qty: 100 | Fill #0

## 2017-08-20 MED FILL — VICTOZA 2-PAK 18 MG/3 ML PE: 18 | 30 days supply | Qty: 6 | Fill #0

## 2017-08-20 NOTE — Telephone Encounter (Signed)
[id number];FBPPHK:[id number];Review Type:Prior Auth;Coverage Start Date:07/20/2017;Coverage End Date:08/20/2019

## 2017-08-25 MED FILL — ATORVASTATIN 20 MG TABLET: 20 | 30 days supply | Qty: 30 | Fill #0

## 2017-08-31 ENCOUNTER — Other Ambulatory Visit: Payer: Self-pay | Admitting: Medical

## 2017-08-31 MED FILL — metFORMIN HCL 500 MG TABS: 500 | 30 days supply | Qty: 120 | Fill #0

## 2017-08-31 NOTE — Telephone Encounter (Signed)
Copied from [place] [id number]. Topic: Quick Communication - See Telephone Encounter >> Aug 31, 2017  4:33 PM Vernona Rieger wrote: CRM for notification. See Telephone encounter for:   Patient called & stated that the insulin that Dr Harvie Heck put him on about 6-7 days ago, it is making him have pressure in his chest. He wants to know if he can be prescribed something else or if it will pass. He said it is driving him up the wall. Please call back @ [phone]. Pharmacy [facility].  08/31/17.

## 2017-09-01 ENCOUNTER — Telehealth: Payer: Self-pay | Admitting: Medical

## 2017-09-01 NOTE — Telephone Encounter (Addendum)
I called patient back after hours.  I was not in office this afternoon.  I had to leave a message since he was not available.  Did recommend that he stop the Victoza as he thought he had side effects and felt terrible.  I want to go over other medication options.  Considering possibly using Invokana if he has not been on that in the past.  Advised him he could also call my cell phone in the next 20 minutes if he got the message.  I did remind him that if he were to get any recurrent chest pain then be seen in the emergency department.  I might be busy over the next couple of days but I will try to call him if I get a break between patients.  Or if I get a no-show.  Would you help remind me.

## 2017-09-01 NOTE — Telephone Encounter (Signed)
Spoke to provider about pt's sx's as stated in previous note. Provider recommended that if pt is actively having chest pressure right now he should come in to be seen either here in the office of by Urgent care or ER. Called pt back to find out if he was currently experiencing chest pressure. Pt states that he is not currently having chest pressure and that the last episode was last night and that today he feels better since he did not take his dose of Victoza. Pt states that he would like to discuss another option because he does not want to take Victoza anymore. Please advise.

## 2017-09-01 NOTE — Telephone Encounter (Signed)
Contacted pt regarding symptoms related to Victoza; pt states that he feels horrible; pt describes feeling "chest pressure, no energy; pt instructed to start at 0.6 and increase to next dose, but he felt so bad he left work; will [employer] for further direction; Spoke with [place] regarding this issue; she will contact pt with instructions; pt can be reached at [phone]; see [place]

## 2017-09-01 NOTE — Telephone Encounter (Signed)
Request for medication change

## 2017-09-04 NOTE — Telephone Encounter (Signed)
Pt states he will not be able to come next week at all rescheduled pt for 12/10/018 at 4:00.Pt sugar was 160 this morning.

## 2017-09-04 NOTE — Telephone Encounter (Signed)
Ok. That sounds good. Sugars not extremely high.

## 2017-09-07 ENCOUNTER — Other Ambulatory Visit: Payer: Self-pay

## 2017-09-07 ENCOUNTER — Ambulatory Visit (HOSPITAL_BASED_OUTPATIENT_CLINIC_OR_DEPARTMENT_OTHER): Payer: BLUE CROSS/BLUE SHIELD | Admitting: Hematology & Oncology

## 2017-09-07 ENCOUNTER — Other Ambulatory Visit (HOSPITAL_BASED_OUTPATIENT_CLINIC_OR_DEPARTMENT_OTHER): Payer: BLUE CROSS/BLUE SHIELD

## 2017-09-07 ENCOUNTER — Telehealth: Payer: Self-pay | Admitting: Medical

## 2017-09-07 ENCOUNTER — Encounter: Payer: Self-pay | Admitting: Hematology & Oncology

## 2017-09-07 DIAGNOSIS — E119 Type 2 diabetes mellitus without complications: Secondary | ICD-10-CM

## 2017-09-07 DIAGNOSIS — E349 Endocrine disorder, unspecified: Secondary | ICD-10-CM

## 2017-09-07 DIAGNOSIS — E1100 Type 2 diabetes mellitus with hyperosmolarity without nonketotic hyperglycemic-hyperosmolar coma (NKHHC): Secondary | ICD-10-CM

## 2017-09-07 DIAGNOSIS — D751 Secondary polycythemia: Secondary | ICD-10-CM | POA: Diagnosis not present

## 2017-09-07 DIAGNOSIS — K219 Gastro-esophageal reflux disease without esophagitis: Secondary | ICD-10-CM

## 2017-09-07 DIAGNOSIS — E291 Testicular hypofunction: Secondary | ICD-10-CM

## 2017-09-07 LAB — CBC WITH DIFFERENTIAL (CANCER CENTER ONLY)
BASO#: 0 10*3/uL (ref 0.0–0.2)
BASO%: 0.2 % (ref 0.0–2.0)
EOS ABS: 0.5 10*3/uL (ref 0.0–0.5)
EOS%: 5.6 % (ref 0.0–7.0)
HCT: 44.9 % (ref 38.7–49.9)
HEMOGLOBIN: 16.1 g/dL (ref 13.0–17.1)
LYMPH#: 1.8 10*3/uL (ref 0.9–3.3)
LYMPH%: 21.5 % (ref 14.0–48.0)
MCH: 32.5 pg (ref 28.0–33.4)
MCHC: 35.9 g/dL (ref 32.0–35.9)
MCV: 91 fL (ref 82–98)
MONO#: 0.8 10*3/uL (ref 0.1–0.9)
MONO%: 8.8 % (ref 0.0–13.0)
NEUT%: 63.9 % (ref 40.0–80.0)
NEUTROS ABS: 5.4 10*3/uL (ref 1.5–6.5)
PLATELETS: 207 10*3/uL (ref 145–400)
RBC: 4.96 10*6/uL (ref 4.20–5.70)
RDW: 13 % (ref 11.1–15.7)
WBC: 8.5 10*3/uL (ref 4.0–10.0)

## 2017-09-07 LAB — CMP (CANCER CENTER ONLY)
ALK PHOS: 57 U/L (ref 26–84)
ALT: 57 U/L — AB (ref 10–47)
AST: 31 U/L (ref 11–38)
Albumin: 3.8 g/dL (ref 3.3–5.5)
BUN, Bld: 12 mg/dL (ref 7–22)
CO2: 29 mEq/L (ref 18–33)
CREATININE: 1.1 mg/dL (ref 0.6–1.2)
Calcium: 9.2 mg/dL (ref 8.0–10.3)
Chloride: 100 mEq/L (ref 98–108)
GLUCOSE: 165 mg/dL — AB (ref 73–118)
Potassium: 4.1 mEq/L (ref 3.3–4.7)
SODIUM: 141 meq/L (ref 128–145)
TOTAL PROTEIN: 6.8 g/dL (ref 6.4–8.1)
Total Bilirubin: 1 mg/dl (ref 0.20–1.60)

## 2017-09-07 NOTE — Telephone Encounter (Signed)
I reviewed patient's visit note with Dr. Marin Olp today.  Note states she has not felt well recently.  When this is follow-up?  What date?  And it sounds like it would need to be a 30-minute appointment.[name]

## 2017-09-07 NOTE — Progress Notes (Signed)
Hematology and Oncology Follow Up Visit  Kevin Wiggins [id number] Feb 16, 1961 56 y.o. 09/07/2017   Principle Diagnosis:  1.  Hemochromatosis (homozygous for H63D mutation). 2. Hypogonadism secondary to hemochromatosis. 3. Erythrocytosis secondary to testosterone therapy. 4. Ruptured right sinus of Valsalva aneurysm.  Current Therapy:   1. The patient status post cardiac surgery to repair the sinus of     Valsalva aneurysm. 2. Phlebotomy to keep hematocrit below 48%. 3. Testosterone 200 mg IM q.2 weeks.     Interim History:  [name] is back for followup.  Unfortunately, he is not feeling all that well.  He thinks that this has to do with him being put on Victoza.  He now is off it.  He still feels quite poor.  He has a lot of chest tightness.  He has little bit of shortness of breath.  Recently, he thought that he may have to go to the emergency room.  This is at nighttime.  He did feel a little bit better after a couple hours.  He does have some reflux.  He is not taking his omeprazole daily.  He has had cardiac surgery in the past.  As such, we did get an EKG on him.  This did show some chronic changes.  He has a right bundle branch block.  He has left axis deviation.  This EKG looks similar to what he had done 2 months ago.  That may be feel more reassured.  He has had no nausea or vomiting.  He has been quite busy.  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  He is incredibly popular and it sounds like he may actually pursue this as a primary business.  He did have a nice Thanksgiving.  Overall, his performance status is ECOG 1.  Medications:  Current Outpatient Medications:  .  atorvastatin (LIPITOR) 20 MG tablet, Take 1 tablet (20 mg total) daily by mouth. Needs office visit, Disp: 30 tablet, Rfl: 0 .  liraglutide 18 MG/3ML SOPN, 0.6 mg injection once daily for 1 week.  Then may increase injection to 1.2 mg daily., Disp: 3 pen, Rfl: 3 .  losartan (COZAAR)  100 MG tablet, Take 1 tablet (100 mg total) daily by mouth., Disp: 90 tablet, Rfl: 0 .  metFORMIN (GLUCOPHAGE) 500 MG tablet, TAKE 2 TABLETS BY MOUTH 2 TIMES A DAY WITH A MEAL, Disp: 120 tablet, Rfl: 0 .  omeprazole (PRILOSEC) 20 MG capsule, Take 20 mg by mouth every evening. , Disp: , Rfl:  .  testosterone cypionate (DEPOTESTOSTERONE CYPIONATE) 200 MG/ML injection, Inject 1 mL (200 mg total) into the muscle every 14 (fourteen) days. As needed now (Patient taking differently: Inject 200 mg into the muscle every 14 (fourteen) days. As needed now), Disp: 10 mL, Rfl: 3 .  traMADol (ULTRAM) 50 MG tablet, Take 1-2 tablets (50-100 mg total) by mouth every 6 (six) hours as needed., Disp: 30 tablet, Rfl: 0 .  vardenafil (LEVITRA) 20 MG tablet, Take 1 tablet (20 mg total) by mouth daily as needed for erectile dysfunction. (Patient taking differently: Take 20 mg by mouth daily as needed for erectile dysfunction. ), Disp: 10 tablet, Rfl: 0  Allergies:  Allergies  Allergen Reactions  . Amlodipine Other (See Comments)    Pt reports dizziness and sleepy.   . Lisinopril Other (See Comments)    cough    Past Medical History, Surgical history, Social history, and Family History were reviewed and  updated.  Review of Systems: As stated in the interim history  Physical Exam:  weight is 266 lb 1.9 oz (120.7 kg). His oral temperature is 97.8 F (36.6 C). His blood pressure is 132/82 and his pulse is 94.   Somewhat obese white male in no obvious distress.  Head neck exam shows no ocular or oral lesions.  He has no palpable cervical or supraclavicular lymph nodes.  Lungs are clear bilaterally.  He has no rales, wheezes or rhonchi.  Cardiac exam regular rate and rhythm with no murmurs, rubs or bruits.  Abdomen is soft.  He has good bowel sounds.  There is no fluid wave.  He is moderately obese.  He has no guarding or rebound tenderness.  There is no palpable liver or spleen tip.  Back exam shows no tenderness over  the spine, ribs or hips.  Extremities shows no clubbing, cyanosis or edema.  Neurological exam shows no focal neurological deficits.     Lab Results  Component Value Date   WBC 8.5 09/07/2017   HGB 16.1 09/07/2017   HCT 44.9 09/07/2017   MCV 91 09/07/2017   PLT 207 09/07/2017     Chemistry      Component Value Date/Time   NA 141 09/07/2017 1503   NA 138 05/20/2017 1050   K 4.1 09/07/2017 1503   K 4.6 05/20/2017 1050   CL 100 09/07/2017 1503   CO2 29 09/07/2017 1503   CO2 26 05/20/2017 1050   BUN 12 09/07/2017 1503   BUN 16.7 05/20/2017 1050   CREATININE 1.1 09/07/2017 1503   CREATININE 1.3 05/20/2017 1050      Component Value Date/Time   CALCIUM 9.2 09/07/2017 1503   CALCIUM 10.0 05/20/2017 1050   ALKPHOS 57 09/07/2017 1503   ALKPHOS 60 05/20/2017 1050   AST 31 09/07/2017 1503   AST 42 (H) 05/20/2017 1050   ALT 57 (H) 09/07/2017 1503   ALT 69 (H) 05/20/2017 1050   BILITOT 1.00 09/07/2017 1503   BILITOT 0.95 05/20/2017 1050         Impression and Plan: [name] is 56 year old white male.Kevin Wiggins He has a homozygous mutation for one of the minor genes for hemochromatosis.    I am still puzzled as to why he is not feeling that well.  May be it is a reaction that he had to treatment with the Victoza.  He does not need to be phlebotomized today.  I am glad that we did the EKG on him.  It is reassuring that there is really no change.  I will plan to see him back in 3 weeks.  I want to make sure that we follow up closely with him.  I spent about 35 minutes with him today.   [name], MD 12/3/20185:01 PM

## 2017-09-08 ENCOUNTER — Other Ambulatory Visit: Payer: BLUE CROSS/BLUE SHIELD

## 2017-09-08 LAB — IRON AND TIBC
%SAT: 47 % (ref 20–55)
IRON: 136 ug/dL (ref 42–163)
TIBC: 286 ug/dL (ref 202–409)
UIBC: 151 ug/dL (ref 117–376)

## 2017-09-08 LAB — FERRITIN: FERRITIN: 74 ng/mL (ref 22–316)

## 2017-09-08 NOTE — Telephone Encounter (Signed)
Call patient and patient has a appointment 12/7 @ 3:45 for 29mins, per note.

## 2017-09-11 ENCOUNTER — Ambulatory Visit (INDEPENDENT_AMBULATORY_CARE_PROVIDER_SITE_OTHER): Payer: BLUE CROSS/BLUE SHIELD | Admitting: Medical

## 2017-09-11 ENCOUNTER — Ambulatory Visit: Payer: BLUE CROSS/BLUE SHIELD | Admitting: Medical

## 2017-09-11 ENCOUNTER — Encounter: Payer: Self-pay | Admitting: Medical

## 2017-09-11 VITALS — BP 138/88 | HR 93 | Temp 98.4°F | Resp 16 | Wt 263.8 lb

## 2017-09-11 DIAGNOSIS — E119 Type 2 diabetes mellitus without complications: Secondary | ICD-10-CM

## 2017-09-11 DIAGNOSIS — I5033 Acute on chronic diastolic (congestive) heart failure: Secondary | ICD-10-CM | POA: Diagnosis not present

## 2017-09-11 DIAGNOSIS — I10 Essential (primary) hypertension: Secondary | ICD-10-CM | POA: Diagnosis not present

## 2017-09-11 NOTE — Progress Notes (Signed)
Subjective:    Patient ID: Kevin Wiggins, male    DOB: April 18, 1961, 56 y.o.   MRN: [id number]  HPI  Pt in for follow up.  Pt states he just got layed off. His company has been cutting back. Pt might have job connection with [employer].   Pt states his sugars have been better. His sugars today was 165 today after eating sandwich. Highest he got was 240 since I last saw him.  But that was neatly after eating at a [employer].  Pt other sugar readings were 114. Most of time sugar is between 114 and 170.  I had been given him victoza and he states he felt terrible when he increased the dose from 0.6 mg to 1.2 mg. I had advised him to slowly increase dosage but when he increased the dose he felt very tired and weak. Some pressure in chest on Monday. Pt saw Dr. Marin Olp and  ekg was done. No change from prior. Faint chest pressure symptoms resolved on Monday.  No reoccurrence.  It has been more than a year since he saw his cardiologist.  Pt states he was getting good readings with just low dose. He is willing to just stay on/restart low dose victoza again.    Review of Systems  Constitutional: Negative for chills, fatigue and fever.       Resolved symptoms. Of fatigue.  HENT: Negative for congestion and ear pain.   Respiratory: Negative for cough, chest tightness, shortness of breath and wheezing.   Cardiovascular: Negative for chest pain and palpitations.       No presently.  Gastrointestinal: Negative for abdominal pain.  Musculoskeletal: Negative for back pain.  Skin: Negative for rash and wound.  Neurological: Negative for dizziness, speech difficulty, weakness and headaches.       Resolved symptoms of weakness.  Hematological: Negative for adenopathy. Does not bruise/bleed easily.  Psychiatric/Behavioral: Negative for behavioral problems and confusion.   Past Medical History:  Diagnosis Date  . CAD (coronary artery disease) cardiologist-  dr dalton mclean   a. 07/2013: mild  nonobstructive CAD (30% RCA)  by cath 07/25/13 as part of eval for VSD/SOB.  . CKD (chronic kidney disease), stage II   . Diastolic CHF ([place])    a. Acute diastolic CHF 04/3709 in the setting of perimembranous VSD. Eval underway given mod pulm HTN and RV dilation.  . ED (erectile dysfunction)   . GERD (gastroesophageal reflux disease)   . Heart murmur   . Hereditary hemochromatosis [facility]) hemotologist/ oncologist-  dr Marin Olp   H63D Homozyous mutation--  treatment phlebotomy (last one 12-03-2016) for HCT < 48%  . Hyperlipidemia   . Hypertension   . LAFB (left anterior fascicular block)   . Mass of soft tissue of right upper extremity   . OSA (obstructive sleep apnea)    severe osa per study 11-17-2013 --  no cpap/  per pt uses Breath Right strips  . RBBB   . Refusal of blood transfusions as patient is Jehovah's Witness   . 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 Secondary erythrocytosis    due to testosterone therapy  . Secondary testicular hypogonadism    due to hemochromatosis  . Type 2 diabetes mellitus, uncontrolled ([place])    followed by pcp--- last A1c 11.0 on 06-05-2017/  on 07-14-2017 pt  stated does not check CBGs, asked pt about A1c being high he stated "doctor told him to watch carbs and did not add medication"     Social History   Socioeconomic History  . Marital status: Married    Spouse name: Not on file  . Number of children: Not on file  . Years of education: Not on file  . Highest education level: Not on file  Social Needs  . Financial resource strain: Not on file  . Food insecurity - worry: Not on file  . Food insecurity - inability: Not on file  . Transportation needs - medical: Not on file  . Transportation needs - non-medical: Not on file  Occupational History    Employer: [employer]    Comment: Maintenance   Tobacco Use  . Smoking  status: Never Smoker  . Smokeless tobacco: Never Used  Substance and Sexual Activity  . Alcohol use: No    Alcohol/week: 0.0 oz  . Drug use: No  . Sexual activity: Not on file  Other Topics Concern  . Not on file  Social History Narrative  . Not on file    Past Surgical History:  Procedure Laterality Date  . LEFT AND RIGHT HEART CATHETERIZATION WITH CORONARY ANGIOGRAM N/A 07/25/2013   Procedure: LEFT AND RIGHT HEART CATHETERIZATION WITH CORONARY ANGIOGRAM;  Surgeon: Jolaine Artist, MD;  Location: [facility] CATH LAB;  Service: Cardiovascular;  Laterality: N/A; patent coronary arteries w/ non-obstructive CAD (30% RCA); moderate pulmonary HTN;  O2 saturations consistent w/ significant left-right intracardiac shunt  . MASS EXCISION Right 07/17/2017   Procedure: REMOVAL OF RIGHT UPPER ARM SOFT TISSUE MASS;  Surgeon: Jackolyn Confer, MD;  Location: [place];  Service: General;  Laterality: Right;  . REPAIR SINUS VALSALVA ANEURYSM W/ CPB (STERNOTOMY)  08-26-2013   @[facility]    "patched"  . TEE WITHOUT CARDIOVERSION N/A 07/25/2013   Procedure: TRANSESOPHAGEAL ECHOCARDIOGRAM (TEE);  Surgeon: Dorothy Spark, MD;  Location: [facility] ENDOSCOPY;  Service: Cardiovascular;  Laterality: N/A; moderate dilated RV w/ mild decreased function; a small restrictive perimembranous VSD, peak velocity 4.76m/sec, peak gradient 76mmHg/ trivial AI/  trivial TR/ mild LAE/ no PFO/  mild aortic atherosclerotic plaque  . TRANSTHORACIC ECHOCARDIOGRAM  10-27-2013   dr Aundra Dubin   mild LVH, ef 55-60%/  mild LAE/  sinuses of valsalva appear normal/  PASP could not be accurately estimated (per dr Aundra Dubin stable s/p sinus of valsalva fistula repair)    Family History  Problem Relation Age of Onset  . Cancer Mother   . Hypertension Father   . Congestive Heart Failure Father   . Colon cancer Paternal Aunt   . Hemochromatosis Unknown        family history    Allergies  Allergen Reactions  . Amlodipine Other (See  Comments)    Pt reports dizziness and sleepy.   . Lisinopril Other (See Comments)    cough    Current Outpatient Medications on File Prior to Visit  Medication Sig Dispense Refill  . atorvastatin (LIPITOR) 20 MG tablet Take 1 tablet (20 mg total) daily by mouth. Needs office visit 30 tablet 0  . liraglutide 18 MG/3ML SOPN 0.6 mg injection once daily for 1 week.  Then may increase injection to 1.2 mg daily. 3 pen 3  . losartan (COZAAR) 100 MG tablet Take 1 tablet (100 mg total) daily by mouth. 90 tablet 0  . metFORMIN (GLUCOPHAGE) 500 MG tablet TAKE 2 TABLETS BY MOUTH 2 TIMES A DAY WITH  A MEAL 120 tablet 0  . omeprazole (PRILOSEC) 20 MG capsule Take 20 mg by mouth every evening.     . testosterone cypionate (DEPOTESTOSTERONE CYPIONATE) 200 MG/ML injection Inject 1 mL (200 mg total) into the muscle every 14 (fourteen) days. As needed now (Patient taking differently: Inject 200 mg into the muscle every 14 (fourteen) days. As needed now) 10 mL 3  . traMADol (ULTRAM) 50 MG tablet Take 1-2 tablets (50-100 mg total) by mouth every 6 (six) hours as needed. 30 tablet 0  . vardenafil (LEVITRA) 20 MG tablet Take 1 tablet (20 mg total) by mouth daily as needed for erectile dysfunction. (Patient taking differently: Take 20 mg by mouth daily as needed for erectile dysfunction. ) 10 tablet 0   No current facility-administered medications on file prior to visit.     BP (!) 148/85   Pulse 93   Temp 98.4 F (36.9 C) (Oral)   Resp 16   Wt 263 lb 12.8 oz (119.7 kg)   SpO2 97%   BMI 38.96 kg/m       Objective:   Physical Exam  General Mental Status- Alert. General Appearance- Not in acute distress.   Skin General: Color- Normal Color. Moisture- Normal Moisture.  Neck Carotid Arteries- Normal color. Moisture- Normal Moisture. No carotid bruits. No JVD.  Chest and Lung Exam Auscultation: Breath Sounds:-Normal.  Cardiovascular Auscultation:Rythm- Regular. Murmurs & Other Heart  Sounds:Auscultation of the heart reveals- No Murmurs.  Abdomen Inspection:-Inspeection Normal. Palpation/Percussion:Note:No mass. Palpation and Percussion of the abdomen reveal- Non Tender, Non Distended + BS, no rebound or guarding.    Neurologic Cranial Nerve exam:- CN III-XII intact(No nystagmus), symmetric smile. Strength:- 5/5 equal and symmetric strength both upper and lower extremities.  Lower ext- no pedal edema. Negative homans signs,      Assessment & Plan:  For your diabetes, I want you to continue the Metformin and restart the victoza but stay at the lowest dose of 0.6 mg.  I did talk with been the pharmacist downstairs and he is not familiar with the victoza causing any cardiac type side effects.  However to be safe  he recommended just staying at the lowest dose.  Your EKG done by Dr. Marin Olp was stable/no changes from prior EKG.  You have no current cardiac symptoms.  I do think it is a good idea to refer you back to your cardiologist since you have not seen him in about a year and a half.  BP is stable today. Not perfect but you just got layed off.  Stay on current BP medication  Follow-up in 3 weeks or as needed.  If you do have any recurrent side effects with low-dose dose victoza. Please stop med and notify me.  Betzalel Umbarger, Percell Miller, PA-C

## 2017-09-11 NOTE — Patient Instructions (Addendum)
For your diabetes, I want you to continue the Metformin and restart the victoza but stay at the lowest dose of 0.6 mg.  I did talk with been the pharmacist downstairs and he is not familiar with the victoza causing any cardiac type side effects.  However to be safe  he recommended just staying at the lowest dose.  Your EKG done by Dr. Marin Olp was stable/no changes from prior EKG.  You have no current cardiac symptoms.  I do think it is a good idea to refer you back to your cardiologist since you have not seen him in about a year and a half.  BP is stable today. Not perfect but you just got layed off.  Stay on current BP medication  Follow-up in 3 weeks or as needed.  If you do have any recurrent side effects with low-dose dose victoza. Please stop med and notify me.

## 2017-09-14 ENCOUNTER — Ambulatory Visit: Payer: BLUE CROSS/BLUE SHIELD | Admitting: Medical

## 2017-09-28 ENCOUNTER — Other Ambulatory Visit: Payer: Self-pay | Admitting: Medical

## 2017-09-28 MED FILL — ATORVASTATIN 20 MG TABLET: 20 | 30 days supply | Qty: 30 | Fill #0

## 2017-09-28 MED FILL — LOSARTAN POTASSIUM 100 MG T: 100 | 30 days supply | Qty: 30 | Fill #1

## 2017-10-02 ENCOUNTER — Other Ambulatory Visit: Payer: Self-pay | Admitting: Medical

## 2017-10-02 MED FILL — metFORMIN HCL 500 MG TABS: 500 | 30 days supply | Qty: 120 | Fill #0

## 2017-10-29 MED FILL — ATORVASTATIN 20 MG TABLET: 20 | 30 days supply | Qty: 30 | Fill #1

## 2017-11-02 MED FILL — LOSARTAN POTASSIUM 100 MG T: 100 | 30 days supply | Qty: 30 | Fill #2

## 2017-11-10 ENCOUNTER — Other Ambulatory Visit: Payer: Self-pay | Admitting: Medical

## 2017-11-11 MED FILL — metFORMIN HCL 500 MG TABS: 500 | 30 days supply | Qty: 120 | Fill #0

## 2017-12-03 ENCOUNTER — Other Ambulatory Visit: Payer: Self-pay | Admitting: Medical

## 2017-12-03 MED FILL — ATORVASTATIN 20 MG TABLET: 20 | 30 days supply | Qty: 30 | Fill #2

## 2017-12-07 MED FILL — LOSARTAN POTASSIUM 100 MG T: 100 | 90 days supply | Qty: 90 | Fill #0

## 2017-12-24 ENCOUNTER — Other Ambulatory Visit: Payer: Self-pay | Admitting: Medical

## 2017-12-25 MED FILL — VICTOZA 2-PAK 18 MG/3 ML PE: 18 | 30 days supply | Qty: 6 | Fill #1

## 2017-12-29 MED FILL — ATORVASTATIN 20 MG TABLET: 20 | 30 days supply | Qty: 30 | Fill #3

## 2017-12-29 MED FILL — metFORMIN HCL 500 MG TABS: 500 | 30 days supply | Qty: 120 | Fill #0

## 2017-12-29 NOTE — Telephone Encounter (Signed)
Refill sent to pharmacy. Left message for pt to call and schedule follow up with PCP. Last OV 09/2017 and he is due now for follow up on his diabetes.

## 2017-12-29 NOTE — Telephone Encounter (Signed)
Relation to pt: Kevin Wiggins,Kevin Wiggins / spouse  Call back [facility]: [place], [place] - [place] [phone] (Phone) [phone] (Fax)     Reason for call:   Patient checking on the status of metFORMIN (GLUCOPHAGE) 500 MG tablet  refill, pharmacy sent in request on 12/24/17, pease advise

## 2018-02-09 MED FILL — ATORVASTATIN 20 MG TABLET: 20 | 30 days supply | Qty: 30 | Fill #4

## 2018-02-16 MED FILL — metFORMIN HCL 500 MG TABS: 500 | 30 days supply | Qty: 120 | Fill #1

## 2018-03-08 ENCOUNTER — Other Ambulatory Visit: Payer: Self-pay | Admitting: Medical

## 2018-03-08 MED FILL — LOSARTAN POTASSIUM 100 MG T: 100 | 30 days supply | Qty: 30 | Fill #0

## 2018-03-08 MED FILL — ATORVASTATIN 20 MG TABLET: 20 | 30 days supply | Qty: 30 | Fill #5

## 2018-03-08 NOTE — Telephone Encounter (Signed)
LVM on pt's cell phone to Schedule a FU appt with Mackie Pai at his convenience.

## 2018-03-08 NOTE — Telephone Encounter (Signed)
Pt due for follow up please call and schedule appointment.  

## 2018-03-10 MED FILL — VICTOZA 2-PAK 18 MG/3 ML PE: 18 | 30 days supply | Qty: 6 | Fill #2

## 2018-03-30 MED FILL — metFORMIN HCL 500 MG TABS: 500 | 30 days supply | Qty: 120 | Fill #2

## 2018-04-06 DIAGNOSIS — S46011A Strain of muscle(s) and tendon(s) of the rotator cuff of right shoulder, initial encounter: Secondary | ICD-10-CM | POA: Insufficient documentation

## 2018-04-06 DIAGNOSIS — M25531 Pain in right wrist: Secondary | ICD-10-CM | POA: Insufficient documentation

## 2018-04-09 ENCOUNTER — Other Ambulatory Visit: Payer: Self-pay | Admitting: Medical

## 2018-04-09 MED FILL — LOSARTAN POTASSIUM 100 MG T: 100 | 30 days supply | Qty: 30 | Fill #1

## 2018-04-09 MED FILL — ATORVASTATIN 20 MG TABLET: 20 | 30 days supply | Qty: 30 | Fill #0

## 2018-04-09 NOTE — Telephone Encounter (Signed)
Pt due for follow up please call and schedule appointment.  

## 2018-04-12 DIAGNOSIS — M67921 Unspecified disorder of synovium and tendon, right upper arm: Secondary | ICD-10-CM | POA: Insufficient documentation

## 2018-04-23 MED FILL — VICTOZA 2-PAK 18 MG/3 ML PE: 18 | 30 days supply | Qty: 6 | Fill #3

## 2018-05-12 ENCOUNTER — Other Ambulatory Visit: Payer: Self-pay | Admitting: Medical

## 2018-05-12 MED FILL — ATORVASTATIN CALCIUM 20 MG: 20 | 30 days supply | Qty: 30 | Fill #0

## 2018-05-12 MED FILL — LOSARTAN POTASSIUM 100 MG T: 100 | 30 days supply | Qty: 30 | Fill #2

## 2018-05-24 ENCOUNTER — Other Ambulatory Visit: Payer: Self-pay | Admitting: Medical

## 2018-05-24 MED FILL — metFORMIN HCL 500 MG TABS: 500 | 30 days supply | Qty: 120 | Fill #0

## 2018-05-24 NOTE — Telephone Encounter (Signed)
Pt is due for follow up Please call and schedule appointment.

## 2018-05-24 NOTE — Telephone Encounter (Signed)
LVM for pt to call office and schedule fu appt, pt is due with provider.

## 2018-06-14 ENCOUNTER — Other Ambulatory Visit: Payer: Self-pay | Admitting: Medical

## 2018-06-14 MED FILL — LOSARTAN POTASSIUM 100 MG T: 100 | 30 days supply | Qty: 30 | Fill #0

## 2018-06-14 MED FILL — ATORVASTATIN CALCIUM 20 MG: 20 | 30 days supply | Qty: 30 | Fill #0

## 2018-06-15 MED FILL — VICTOZA 2-PAK 18 MG/3 ML PE: 18 | 30 days supply | Qty: 6 | Fill #4

## 2018-06-30 DIAGNOSIS — I10 Essential (primary) hypertension: Secondary | ICD-10-CM | POA: Diagnosis not present

## 2018-06-30 DIAGNOSIS — Z01818 Encounter for other preprocedural examination: Secondary | ICD-10-CM | POA: Diagnosis not present

## 2018-06-30 DIAGNOSIS — E669 Obesity, unspecified: Secondary | ICD-10-CM | POA: Insufficient documentation

## 2018-06-30 DIAGNOSIS — S46011A Strain of muscle(s) and tendon(s) of the rotator cuff of right shoulder, initial encounter: Secondary | ICD-10-CM | POA: Diagnosis not present

## 2018-06-30 DIAGNOSIS — J309 Allergic rhinitis, unspecified: Secondary | ICD-10-CM | POA: Insufficient documentation

## 2018-07-26 ENCOUNTER — Telehealth: Payer: Self-pay | Admitting: Medical

## 2018-07-26 ENCOUNTER — Other Ambulatory Visit: Payer: Self-pay | Admitting: Medical

## 2018-07-26 MED ORDER — ATORVASTATIN CALCIUM 20 MG PO TABS: 20.0000 mg | ORAL_TABLET | Freq: Every day | ORAL | 0 refills | Status: DC

## 2018-07-26 MED FILL — metFORMIN HCL 500 MG TABS: 500 | 30 days supply | Qty: 120 | Fill #1

## 2018-07-26 MED FILL — LOSARTAN POTASSIUM 100 MG T: 100 | 30 days supply | Qty: 30 | Fill #1

## 2018-07-26 MED FILL — ATORVASTATIN CALCIUM 20 MG: 20 | 30 days supply | Qty: 30 | Fill #0

## 2018-07-26 NOTE — Telephone Encounter (Signed)
Refill sent to pharmacy.   

## 2018-07-26 NOTE — Telephone Encounter (Signed)
Pt needs refill on Lipitor. Pt is scheduled for 08/09/18 @1pm  to see Percell Miller.

## 2018-08-09 ENCOUNTER — Encounter: Payer: Self-pay | Admitting: Medical

## 2018-08-09 ENCOUNTER — Ambulatory Visit: Payer: 59 | Admitting: Medical

## 2018-08-09 VITALS — BP 128/78 | HR 88 | Ht 69.0 in | Wt 264.0 lb

## 2018-08-09 DIAGNOSIS — E785 Hyperlipidemia, unspecified: Secondary | ICD-10-CM | POA: Diagnosis not present

## 2018-08-09 DIAGNOSIS — M25511 Pain in right shoulder: Secondary | ICD-10-CM | POA: Diagnosis not present

## 2018-08-09 DIAGNOSIS — I10 Essential (primary) hypertension: Secondary | ICD-10-CM | POA: Diagnosis not present

## 2018-08-09 DIAGNOSIS — E119 Type 2 diabetes mellitus without complications: Secondary | ICD-10-CM

## 2018-08-09 MED ORDER — LIRAGLUTIDE 18 MG/3ML ~~LOC~~ SOPN: PEN_INJECTOR | SUBCUTANEOUS | 3 refills | Status: DC

## 2018-08-09 MED ORDER — METFORMIN HCL 500 MG PO TABS: ORAL_TABLET | ORAL | 2 refills | Status: DC

## 2018-08-09 MED FILL — VICTOZA 2-PAK 18 MG/3 ML PE: 18 | 30 days supply | Qty: 6 | Fill #0

## 2018-08-09 NOTE — Progress Notes (Signed)
Subjective:    Patient ID: Kevin Wiggins, male    DOB: 03-Sep-1961, 57 y.o.   MRN: [id number]  HPI Pt update me he had rt shoulder injury/tore his rotator cuff. He had surgery about one month ago. Pain intermittent and severe at time.   He states had bicep tendon tear at time of injury. When they tried to repair but failed attempt. When he sneezed 2 weeks ago felt pain over entire bicep. Proximal and distal portion.  Pt has diabetes last a1c was 11.0. He describes delay in follow up due to dealing with shoulder injury. Pt was on metformin in past. Last time I reviewed labs wanted to know if he would consider victoza. Pt admits he has not been compliant on his diet and he was off medications off his metformin and victoza for 3 weeks and then he ran out.  Pt also has high cholesterol and is on lipitor.  Hx of high cholesterol in the past.   Review of Systems  Constitutional: Negative for chills, fatigue and fever.  HENT:       Recent sneezing caused some worse rt bicep area pain.  Respiratory: Negative for cough, choking, shortness of breath and wheezing.   Cardiovascular: Negative for chest pain and palpitations.  Gastrointestinal: Negative for abdominal pain.  Genitourinary: Negative for flank pain, frequency, penile pain and urgency.  Musculoskeletal:       Rt shoulder pain. Pain is still moderate to severe even still.  Skin: Negative for color change and rash.  Neurological: Negative for dizziness, seizures, weakness, numbness and headaches.  Hematological: Negative for adenopathy. Does not bruise/bleed easily.  Psychiatric/Behavioral: Negative for behavioral problems, confusion and sleep disturbance. The patient is not nervous/anxious.       Past Medical History:  Diagnosis Date  . CAD (coronary artery disease) cardiologist-  dr dalton mclean   a. 07/2013: mild nonobstructive CAD (30% RCA)  by cath 07/25/13 as part of eval for VSD/SOB.  . CKD (chronic kidney disease), stage  II   . Diastolic CHF ([place])    a. Acute diastolic CHF 16/0737 in the setting of perimembranous VSD. Eval underway given mod pulm HTN and RV dilation.  . ED (erectile dysfunction)   . GERD (gastroesophageal reflux disease)   . Heart murmur   . Hereditary hemochromatosis [facility]) hemotologist/ oncologist-  dr Marin Olp   H63D Homozyous mutation--  treatment phlebotomy (last one 12-03-2016) for HCT < 48%  . Hyperlipidemia   . Hypertension   . LAFB (left anterior fascicular block)   . Mass of soft tissue of right upper extremity   . OSA (obstructive sleep apnea)    severe osa per study 11-17-2013 --  no cpap/  per pt uses Breath Right strips  . RBBB   . Refusal of blood transfusions as patient is Jehovah's Witness   . 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 Secondary erythrocytosis    due to testosterone therapy  . Secondary testicular hypogonadism    due to hemochromatosis  . Type 2 diabetes mellitus, uncontrolled ([place])    followed by pcp--- last A1c 11.0 on 06-05-2017/  on 07-14-2017 pt stated does not check CBGs, asked pt about A1c being high he stated "doctor told him to watch carbs and did not add medication"     Social History   Socioeconomic History  .  Marital status: Married    Spouse name: Not on file  . Number of children: Not on file  . Years of education: Not on file  . Highest education level: Not on file  Occupational History    Employer: [employer]   Social Needs  . Financial resource strain: Not on file  . Food insecurity:    Worry: Not on file    Inability: Not on file  . Transportation needs:    Medical: Not on file    Non-medical: Not on file  Tobacco Use  . Smoking status: Never Smoker  . Smokeless tobacco: Never Used  Substance and Sexual Activity  . Alcohol use: No    Alcohol/week: 0.0 standard drinks  .  Drug use: No  . Sexual activity: Not on file  Lifestyle  . Physical activity:    Days per week: Not on file    Minutes per session: Not on file  . Stress: Not on file  Relationships  . Social connections:    Talks on phone: Not on file    Gets together: Not on file    Attends religious service: Not on file    Active member of club or organization: Not on file    Attends meetings of clubs or organizations: Not on file    Relationship status: Not on file  . Intimate partner violence:    Fear of current or ex partner: Not on file    Emotionally abused: Not on file    Physically abused: Not on file    Forced sexual activity: Not on file  Other Topics Concern  . Not on file  Social History Narrative  . Not on file    Past Surgical History:  Procedure Laterality Date  . LEFT AND RIGHT HEART CATHETERIZATION WITH CORONARY ANGIOGRAM N/A 07/25/2013   Procedure: LEFT AND RIGHT HEART CATHETERIZATION WITH CORONARY ANGIOGRAM;  Surgeon: Jolaine Artist, MD;  Location: [facility] CATH LAB;  Service: Cardiovascular;  Laterality: N/A; patent coronary arteries w/ non-obstructive CAD (30% RCA); moderate pulmonary HTN;  O2 saturations consistent w/ significant left-right intracardiac shunt  . MASS EXCISION Right 07/17/2017   Procedure: REMOVAL OF RIGHT UPPER ARM SOFT TISSUE MASS;  Surgeon: Jackolyn Confer, MD;  Location: [place];  Service: General;  Laterality: Right;  . REPAIR SINUS VALSALVA ANEURYSM W/ CPB (STERNOTOMY)  08-26-2013   @[facility]    "patched"  . TEE WITHOUT CARDIOVERSION N/A 07/25/2013   Procedure: TRANSESOPHAGEAL ECHOCARDIOGRAM (TEE);  Surgeon: Dorothy Spark, MD;  Location: [facility] ENDOSCOPY;  Service: Cardiovascular;  Laterality: N/A; moderate dilated RV w/ mild decreased function; a small restrictive perimembranous VSD, peak velocity 4.106m/sec, peak gradient 74mmHg/ trivial AI/  trivial TR/ mild LAE/ no PFO/  mild aortic atherosclerotic plaque  . TRANSTHORACIC ECHOCARDIOGRAM   10-27-2013   dr Aundra Dubin   mild LVH, ef 55-60%/  mild LAE/  sinuses of valsalva appear normal/  PASP could not be accurately estimated (per dr Aundra Dubin stable s/p sinus of valsalva fistula repair)    Family History  Problem Relation Age of Onset  . Cancer Mother   . Hypertension Father   . Congestive Heart Failure Father   . Colon cancer Paternal Aunt   . Hemochromatosis Unknown        family history    Allergies  Allergen Reactions  . Amlodipine Other (See Comments)    Pt reports dizziness and sleepy.   . Lisinopril Other (See Comments)  cough    Current Outpatient Medications on File Prior to Visit  Medication Sig Dispense Refill  . atorvastatin (LIPITOR) 20 MG tablet Take 1 tablet (20 mg total) by mouth daily. 30 tablet 0  . liraglutide 18 MG/3ML SOPN 0.6 mg injection once daily for 1 week.  Then may increase injection to 1.2 mg daily. 3 pen 3  . losartan (COZAAR) 100 MG tablet TAKE 1 TABLET BY MOUTH ONCE DAILY 90 tablet 0  . metFORMIN (GLUCOPHAGE) 500 MG tablet TAKE 2 TABLETS BY MOUTH 2 TIMES A DAY WITH A MEAL 120 tablet 2  . omeprazole (PRILOSEC) 20 MG capsule Take 20 mg by mouth every evening.     . testosterone cypionate (DEPOTESTOSTERONE CYPIONATE) 200 MG/ML injection Inject 1 mL (200 mg total) into the muscle every 14 (fourteen) days. As needed now (Patient taking differently: Inject 200 mg into the muscle every 14 (fourteen) days. As needed now) 10 mL 3  . traMADol (ULTRAM) 50 MG tablet Take 1-2 tablets (50-100 mg total) by mouth every 6 (six) hours as needed. 30 tablet 0  . vardenafil (LEVITRA) 20 MG tablet Take 1 tablet (20 mg total) by mouth daily as needed for erectile dysfunction. (Patient taking differently: Take 20 mg by mouth daily as needed for erectile dysfunction. ) 10 tablet 0   No current facility-administered medications on file prior to visit.     Pulse 88   Wt 264 lb (119.7 kg)   SpO2 96%   BMI 38.99 kg/m      Objective:   Physical  Exam  General Mental Status- Alert. General Appearance- Not in acute distress.   Skin General: Color- Normal Color. Moisture- Normal Moisture.  Neck Carotid Arteries- Normal color. Moisture- Normal Moisture. No carotid bruits. No JVD.  Chest and Lung Exam Auscultation: Breath Sounds:-Normal.  Cardiovascular Auscultation:Rythm- Regular. Murmurs & Other Heart Sounds:Auscultation of the heart reveals- No Murmurs.  Abdomen Inspection:-Inspeection Normal. Palpation/Percussion:Note:No mass. Palpation and Percussion of the abdomen reveal- Non Tender, Non Distended + BS, no rebound or guarding.  Neurologic Cranial Nerve exam:- CN III-XII intact(No nystagmus), symmetric smile. Strength:- 5/5 equal and symmetric strength both upper and lower extremities.  Rt shoulder- painful on attempted range. Severe restricted range of motion. Can lift rt arm to shoulder level.      Assessment & Plan:  Your bp is well controlled today. Continue with losartan.  For diabetes will get cmp and a1c this coming week when fasting.  For high cholesterol will get lipid panel. Advise low cholesterol diet.  For rt shoulder pain would recommend continue to follow up with surgeon office and they stated would get him in with PT.  Follow up in 3 month or as needed.    Mackie Pai, PA-C

## 2018-08-09 NOTE — Patient Instructions (Addendum)
Your bp is well controlled today. Continue with losartan.  For diabetes will get cmp and a1c this coming week when fasting.  For high cholesterol will get lipid panel. Advise low cholesterol diet.  For rt shoulder pain would recommend continue to follow up with surgeon office and they stated would get him in with PT.(keep appointment on Aug 20, 2018 with surgeon. Let me know how that goes)  Follow up in 3 month or as needed.

## 2018-08-30 ENCOUNTER — Inpatient Hospital Stay: Payer: 59 | Attending: Hematology & Oncology

## 2018-08-30 ENCOUNTER — Other Ambulatory Visit: Payer: Self-pay

## 2018-08-30 ENCOUNTER — Other Ambulatory Visit: Payer: Self-pay | Admitting: Medical

## 2018-08-30 ENCOUNTER — Inpatient Hospital Stay: Payer: 59

## 2018-08-30 ENCOUNTER — Inpatient Hospital Stay (HOSPITAL_BASED_OUTPATIENT_CLINIC_OR_DEPARTMENT_OTHER): Payer: 59 | Admitting: Hematology & Oncology

## 2018-08-30 ENCOUNTER — Encounter: Payer: Self-pay | Admitting: Hematology & Oncology

## 2018-08-30 DIAGNOSIS — M25511 Pain in right shoulder: Secondary | ICD-10-CM

## 2018-08-30 DIAGNOSIS — E291 Testicular hypofunction: Secondary | ICD-10-CM

## 2018-08-30 DIAGNOSIS — E349 Endocrine disorder, unspecified: Secondary | ICD-10-CM

## 2018-08-30 DIAGNOSIS — E1100 Type 2 diabetes mellitus with hyperosmolarity without nonketotic hyperglycemic-hyperosmolar coma (NKHHC): Secondary | ICD-10-CM

## 2018-08-30 DIAGNOSIS — K751 Phlebitis of portal vein: Secondary | ICD-10-CM

## 2018-08-30 LAB — CMP (CANCER CENTER ONLY)
ALBUMIN: 3.8 g/dL (ref 3.5–5.0)
ALK PHOS: 71 U/L (ref 38–126)
ALT: 40 U/L (ref 0–44)
AST: 21 U/L (ref 15–41)
Anion gap: 13 (ref 5–15)
BUN: 21 mg/dL — AB (ref 6–20)
CALCIUM: 9.6 mg/dL (ref 8.9–10.3)
CO2: 20 mmol/L — AB (ref 22–32)
CREATININE: 1.34 mg/dL — AB (ref 0.61–1.24)
Chloride: 103 mmol/L (ref 98–111)
GFR, Est AFR Am: >60 mL/min (ref >60)
GFR, Estimated: 57 mL/min — ABNORMAL LOW (ref >60)
GLUCOSE: 283 mg/dL — AB (ref 70–99)
Potassium: 4.5 mmol/L (ref 3.5–5.1)
SODIUM: 136 mmol/L (ref 135–145)
Total Bilirubin: 0.8 mg/dL (ref 0.3–1.2)
Total Protein: 7.2 g/dL (ref 6.5–8.1)

## 2018-08-30 LAB — CBC WITH DIFFERENTIAL (CANCER CENTER ONLY)
ABS IMMATURE GRANULOCYTES: 0.03 10*3/uL (ref 0.00–0.07)
BASOS PCT: 1 %
Basophils Absolute: 0.1 10*3/uL (ref 0.0–0.1)
Eosinophils Absolute: 0.6 10*3/uL — ABNORMAL HIGH (ref 0.0–0.5)
Eosinophils Relative: 6 %
HCT: 49.4 % (ref 39.0–52.0)
Hemoglobin: 17.1 g/dL — ABNORMAL HIGH (ref 13.0–17.0)
IMMATURE GRANULOCYTES: 0 %
LYMPHS PCT: 20 %
Lymphs Abs: 2.1 10*3/uL (ref 0.7–4.0)
MCH: 31.8 pg (ref 26.0–34.0)
MCHC: 34.6 g/dL (ref 30.0–36.0)
MCV: 92 fL (ref 80.0–100.0)
MONO ABS: 0.8 10*3/uL (ref 0.1–1.0)
MONOS PCT: 8 %
NEUTROS ABS: 6.6 10*3/uL (ref 1.7–7.7)
Neutrophils Relative %: 65 %
PLATELETS: 208 10*3/uL (ref 150–400)
RBC: 5.37 MIL/uL (ref 4.22–5.81)
RDW: 11.9 % (ref 11.5–15.5)
WBC Count: 10.2 10*3/uL (ref 4.0–10.5)
nRBC: 0 % (ref 0.0–0.2)

## 2018-08-30 LAB — IRON AND TIBC
Iron: 104 ug/dL (ref 42–163)
Saturation Ratios: 39 % (ref 20–55)
TIBC: 270 ug/dL (ref 202–409)
UIBC: 166 ug/dL (ref 117–376)

## 2018-08-30 LAB — FERRITIN: Ferritin: 113 ng/mL (ref 24–336)

## 2018-08-30 MED FILL — metFORMIN HCL 500 MG TABS: 500 | 30 days supply | Qty: 120 | Fill #2

## 2018-08-30 MED FILL — ATORVASTATIN CALCIUM 20 MG: 20 | 30 days supply | Qty: 30 | Fill #0

## 2018-08-30 MED FILL — LOSARTAN POTASSIUM 100 MG T: 100 | 30 days supply | Qty: 30 | Fill #2

## 2018-08-30 NOTE — Patient Instructions (Signed)

## 2018-08-30 NOTE — Progress Notes (Signed)
Hematology and Oncology Follow Up Visit  Kevin Wiggins [id number] 08-12-1961 57 y.o. 08/30/2018   Principle Diagnosis:  1.  Hemochromatosis (homozygous for H63D mutation). 2. Hypogonadism secondary to hemochromatosis. 3. Erythrocytosis secondary to testosterone therapy. 4. Ruptured right sinus of Valsalva aneurysm.  Current Therapy:   1. The patient status post cardiac surgery to repair the sinus of     Valsalva aneurysm. 2. Phlebotomy to keep hematocrit below 48%. 3. Testosterone 200 mg IM q.2 weeks.     Interim History:  [name] is back for followup.  It has been quite a while since we last saw him.  Unfortunately, he lost Kevin job and lost Kevin insurance.  Thankfully, 3 months later he got a new job.  He is working for [employer] in [place].  While at [employer], he had an accident.  He hurt Kevin right shoulder and right bicep.  It took 7 months to finally get surgery.  Work[employer] Comp really caused a lot of problems for him.  He is still having a lot of problems with Kevin right shoulder and bicep.  He is having a lot of pain.  He has marked limited range of motion of the right shoulder.  He is doing some physical therapy.  There is been no problems with Kevin heart.  He has had no cardiac issues.  I think that Kevin diabetes has been doing better.   He actually bought a smoker.  This is a industrial type smoker.  As always, he does smoking part-time.  He has a real challenge for this.  He has had no problems with cough.  He has had no fever.  He has had no change in bowel or bladder habits.  It sounds like Kevin Wiggins might actually be getting married.  I think he will find out over the holidays.  As always, he will be very busy for Thanksgiving and Christmas.  Currently, Kevin performance status is ECOG 1-2.    Medications:  Current Outpatient Medications:  .  atorvastatin (LIPITOR) 20 MG tablet, Take 1 tablet (20 mg total) by mouth daily., Disp: 30 tablet, Rfl: 0 .  liraglutide  (VICTOZA) 18 MG/3ML SOPN, 0.6 mg injection once daily for 1 week.  Then may increase injection to 1.2 mg daily., Disp: 3 pen, Rfl: 3 .  losartan (COZAAR) 100 MG tablet, TAKE 1 TABLET BY MOUTH ONCE DAILY, Disp: 90 tablet, Rfl: 0 .  metFORMIN (GLUCOPHAGE) 500 MG tablet, TAKE 2 TABLETS BY MOUTH 2 TIMES A DAY WITH A MEAL, Disp: 120 tablet, Rfl: 2 .  omeprazole (PRILOSEC) 20 MG capsule, Take 20 mg by mouth every evening. , Disp: , Rfl:  .  testosterone cypionate (DEPOTESTOSTERONE CYPIONATE) 200 MG/ML injection, Inject 1 mL (200 mg total) into the muscle every 14 (fourteen) days. As needed now (Patient taking differently: Inject 200 mg into the muscle every 14 (fourteen) days. As needed now), Disp: 10 mL, Rfl: 3 .  traMADol (ULTRAM) 50 MG tablet, Take 1-2 tablets (50-100 mg total) by mouth every 6 (six) hours as needed., Disp: 30 tablet, Rfl: 0 .  vardenafil (LEVITRA) 20 MG tablet, Take 1 tablet (20 mg total) by mouth daily as needed for erectile dysfunction. (Patient taking differently: Take 20 mg by mouth daily as needed for erectile dysfunction. ), Disp: 10 tablet, Rfl: 0  Allergies:  Allergies  Allergen Reactions  . Amlodipine Other (See Comments)    Pt reports dizziness and sleepy.  Pt reports dizziness and sleepy.   [name]  Lisinopril Other (See Comments)    cough cough    Past Medical History, Surgical history, Social history, and Family History were reviewed and updated.  Review of Systems:   Review of Systems  Constitutional: Negative.   HENT: Negative.   Eyes: Negative.   Respiratory: Negative.   Cardiovascular: Negative.   Gastrointestinal: Negative.   Genitourinary: Negative.   Musculoskeletal: Positive for joint pain.  Skin: Negative.   Neurological: Negative.  Negative for dizziness.  Endo/Heme/Allergies: Negative.   Psychiatric/Behavioral: Negative.        Physical Exam:  weight is 263 lb (119.3 kg). Kevin oral temperature is 98.3 F (36.8 C). Kevin blood pressure is 153/91  (abnormal) and Kevin pulse is 96. Kevin respiration is 18 and oxygen saturation is 97%.   Physical Exam  Constitutional: He is oriented to person, place, and time.  HENT:  Head: Normocephalic and atraumatic.  Mouth/Throat: Oropharynx is clear and moist.  Eyes: Pupils are equal, round, and reactive to light. EOM are normal.  Neck: Normal range of motion.  Cardiovascular: Normal rate, regular rhythm and normal heart sounds.  Pulmonary/Chest: Effort normal and breath sounds normal.  Abdominal: Soft. Bowel sounds are normal.  Musculoskeletal: Normal range of motion. He exhibits no edema, tenderness or deformity.  Extremities shows marked decreased range of motion of the right shoulder.  He cannot do abduction greater than 45 degrees.  He has no internal or external rotation of the right shoulder.  Lymphadenopathy:    He has no cervical adenopathy.  Neurological: He is alert and oriented to person, place, and time.  Skin: Skin is warm and dry. No rash noted. No erythema.  Psychiatric: He has a normal mood and affect. Kevin behavior is normal. Judgment and thought content normal.  Vitals reviewed.    Lab Results  Component Value Date   WBC 10.2 08/30/2018   HGB 17.1 (H) 08/30/2018   HCT 49.4 08/30/2018   MCV 92.0 08/30/2018   PLT 208 08/30/2018     Chemistry      Component Value Date/Time   NA 141 09/07/2017 1503   NA 138 05/20/2017 1050   K 4.1 09/07/2017 1503   K 4.6 05/20/2017 1050   CL 100 09/07/2017 1503   CO2 29 09/07/2017 1503   CO2 26 05/20/2017 1050   BUN 12 09/07/2017 1503   BUN 16.7 05/20/2017 1050   CREATININE 1.1 09/07/2017 1503   CREATININE 1.3 05/20/2017 1050      Component Value Date/Time   CALCIUM 9.2 09/07/2017 1503   CALCIUM 10.0 05/20/2017 1050   ALKPHOS 57 09/07/2017 1503   ALKPHOS 60 05/20/2017 1050   AST 31 09/07/2017 1503   AST 42 (H) 05/20/2017 1050   ALT 57 (H) 09/07/2017 1503   ALT 69 (H) 05/20/2017 1050   BILITOT 1.00 09/07/2017 1503   BILITOT  0.95 05/20/2017 1050         Impression and Plan: Kevin Wiggins is 57 year old white male.[name] He has a homozygous mutation for one of the minor genes for hemochromatosis.    We will definitely phlebotomize him today.  He needs to be phlebotomized.  It actually has been about 11 months since we last saw him.  I cannot believe is been that long since we had last seen him.  As always, it is fun seeing him and talking to him.  I does hope that everything will go okay for Kevin right shoulder.  Hopefully he will not need a second surgery.  I will  plan to see him back in about 2 months.  I want to make sure that we maintain some close follow-up on him.   [name], MD 11/25/20199:25 AM

## 2018-08-30 NOTE — Progress Notes (Signed)
Kevin Wiggins presents today for phlebotomy per MD orders. Phlebotomy procedure started at 0925 and ended at 0932. 500 grams removed from rt [facility] using 16g phlebotomy kit Patient observed for 30 minutes after procedure without any incident. Patient tolerated procedure well. IV needle removed intact.

## 2018-08-31 LAB — TESTOSTERONE: Testosterone: 324 ng/dL (ref 264–916)

## 2018-09-06 DIAGNOSIS — M25511 Pain in right shoulder: Secondary | ICD-10-CM | POA: Insufficient documentation

## 2018-09-24 MED FILL — VICTOZA 2-PAK 18 MG/3 ML PE: 18 | 30 days supply | Qty: 6 | Fill #1

## 2018-09-26 ENCOUNTER — Encounter: Payer: Self-pay | Admitting: Gastroenterology

## 2018-10-08 ENCOUNTER — Other Ambulatory Visit: Payer: Self-pay | Admitting: Medical

## 2018-10-19 MED FILL — LOSARTAN POTASSIUM 100 MG T: 100 | 30 days supply | Qty: 30 | Fill #0

## 2018-10-19 MED FILL — ATORVASTATIN CALCIUM 20 MG: 20 | 30 days supply | Qty: 30 | Fill #0

## 2018-10-19 MED FILL — metFORMIN HCL 500 MG TABS: 500 | 30 days supply | Qty: 120 | Fill #0

## 2018-11-01 ENCOUNTER — Other Ambulatory Visit: Payer: Self-pay | Admitting: Family

## 2018-11-01 ENCOUNTER — Encounter: Payer: Self-pay | Admitting: Hematology & Oncology

## 2018-11-01 ENCOUNTER — Other Ambulatory Visit: Payer: Self-pay

## 2018-11-01 ENCOUNTER — Inpatient Hospital Stay: Payer: 59 | Attending: Hematology & Oncology | Admitting: Hematology & Oncology

## 2018-11-01 ENCOUNTER — Inpatient Hospital Stay: Payer: 59

## 2018-11-01 VITALS — BP 143/81 | HR 89 | Temp 98.6°F | Resp 18 | Wt 257.2 lb

## 2018-11-01 DIAGNOSIS — D751 Secondary polycythemia: Secondary | ICD-10-CM | POA: Diagnosis not present

## 2018-11-01 DIAGNOSIS — I509 Heart failure, unspecified: Secondary | ICD-10-CM

## 2018-11-01 DIAGNOSIS — E291 Testicular hypofunction: Secondary | ICD-10-CM | POA: Diagnosis not present

## 2018-11-01 DIAGNOSIS — M25511 Pain in right shoulder: Secondary | ICD-10-CM

## 2018-11-01 DIAGNOSIS — E1165 Type 2 diabetes mellitus with hyperglycemia: Secondary | ICD-10-CM | POA: Diagnosis not present

## 2018-11-01 DIAGNOSIS — M79601 Pain in right arm: Secondary | ICD-10-CM

## 2018-11-01 LAB — CBC WITH DIFFERENTIAL (CANCER CENTER ONLY)
Abs Immature Granulocytes: 0.03 10*3/uL (ref 0.00–0.07)
Basophils Absolute: 0.1 10*3/uL (ref 0.0–0.1)
Basophils Relative: 1 %
EOS ABS: 0.4 10*3/uL (ref 0.0–0.5)
EOS PCT: 5 %
HCT: 49.3 % (ref 39.0–52.0)
Hemoglobin: 17.3 g/dL — ABNORMAL HIGH (ref 13.0–17.0)
Immature Granulocytes: 0 %
Lymphocytes Relative: 23 %
Lymphs Abs: 1.7 10*3/uL (ref 0.7–4.0)
MCH: 31.9 pg (ref 26.0–34.0)
MCHC: 35.1 g/dL (ref 30.0–36.0)
MCV: 91 fL (ref 80.0–100.0)
Monocytes Absolute: 0.7 10*3/uL (ref 0.1–1.0)
Monocytes Relative: 9 %
Neutro Abs: 4.6 10*3/uL (ref 1.7–7.7)
Neutrophils Relative %: 62 %
Platelet Count: 193 10*3/uL (ref 150–400)
RBC: 5.42 MIL/uL (ref 4.22–5.81)
RDW: 12.1 % (ref 11.5–15.5)
WBC Count: 7.4 10*3/uL (ref 4.0–10.5)
nRBC: 0 % (ref 0.0–0.2)

## 2018-11-01 LAB — CMP (CANCER CENTER ONLY)
ALT: 49 U/L — ABNORMAL HIGH (ref 0–44)
AST: 22 U/L (ref 15–41)
Albumin: 4.4 g/dL (ref 3.5–5.0)
Alkaline Phosphatase: 56 U/L (ref 38–126)
Anion gap: 9 (ref 5–15)
BUN: 16 mg/dL (ref 6–20)
CO2: 27 mmol/L (ref 22–32)
Calcium: 9.8 mg/dL (ref 8.9–10.3)
Chloride: 95 mmol/L — ABNORMAL LOW (ref 98–111)
Creatinine: 1.03 mg/dL (ref 0.61–1.24)
GFR, Estimated: >60 mL/min (ref >60)
Glucose, Bld: 341 mg/dL — ABNORMAL HIGH (ref 70–99)
Potassium: 4.3 mmol/L (ref 3.5–5.1)
Sodium: 131 mmol/L — ABNORMAL LOW (ref 135–145)
Total Bilirubin: 0.9 mg/dL (ref 0.3–1.2)
Total Protein: 7 g/dL (ref 6.5–8.1)

## 2018-11-01 LAB — IRON AND TIBC
IRON: 110 ug/dL (ref 42–163)
Saturation Ratios: 37 % (ref 20–55)
TIBC: 300 ug/dL (ref 202–409)
UIBC: 190 ug/dL (ref 117–376)

## 2018-11-01 LAB — FERRITIN: Ferritin: 116 ng/mL (ref 24–336)

## 2018-11-01 MED ORDER — INSULIN REGULAR HUMAN 100 UNIT/ML IJ SOLN
10.0000 [IU] | Freq: Once | INTRAMUSCULAR | Status: DC
  Administered 2018-11-01: 10 [IU] via SUBCUTANEOUS

## 2018-11-01 MED FILL — VICTOZA 2-PAK 18 MG/3 ML PE: 18 | 30 days supply | Qty: 6 | Fill #2

## 2018-11-01 NOTE — Patient Instructions (Signed)
Therapeutic Phlebotomy Therapeutic phlebotomy is the planned removal of blood from a person's body for the purpose of treating a medical condition. The procedure is similar to donating blood. Usually, about a pint (470 mL, or 0.47 L) of blood is removed. The average adult has 9-12 pints (4.3-5.7 L) of blood in the body. Therapeutic phlebotomy may be used to treat the following medical conditions:  Hemochromatosis. This is a condition in which the blood contains too much iron.  Polycythemia vera. This is a condition in which the blood contains too many red blood cells.  Porphyria cutanea tarda. This is a disease in which an important part of hemoglobin is not made properly. It results in the buildup of abnormal amounts of porphyrins in the body.  Sickle cell disease. This is a condition in which the red blood cells form an abnormal crescent shape rather than a round shape. Tell a health care provider about:  Any allergies you have.  All medicines you are taking, including vitamins, herbs, eye drops, creams, and over-the-counter medicines.  Any problems you or family members have had with anesthetic medicines.  Any blood disorders you have.  Any surgeries you have had.  Any medical conditions you have.  Whether you are pregnant or may be pregnant. What are the risks? Generally, this is a safe procedure. However, problems may occur, including:  Nausea or light-headedness.  Low blood pressure (hypotension).  Soreness, bleeding, swelling, or bruising at the needle insertion site.  Infection. What happens before the procedure?  Follow instructions from your health care provider about eating or drinking restrictions.  Ask your health care provider about: ? Changing or stopping your regular medicines. This is especially important if you are taking diabetes medicines or blood thinners (anticoagulants). ? Taking medicines such as aspirin and ibuprofen. These medicines can thin your  blood. Do not take these medicines unless your health care provider tells you to take them. ? Taking over-the-counter medicines, vitamins, herbs, and supplements.  Wear clothing with sleeves that can be raised above the elbow.  Plan to have someone take you home from the hospital or clinic.  You may have a blood sample taken.  Your blood pressure, pulse rate, and breathing rate will be measured. What happens during the procedure?   To lower your risk of infection: ? Your health care team will wash or sanitize their hands. ? Your skin will be cleaned with an antiseptic.  You may be given a medicine to numb the area (local anesthetic).  A tourniquet will be placed on your arm.  A needle will be inserted into one of your veins.  Tubing and a collection bag will be attached to that needle.  Blood will flow through the needle and tubing into the collection bag.  The collection bag will be placed lower than your arm to allow gravity to help the flow of blood into the bag.  You may be asked to open and close your hand slowly and continually during the entire collection.  After the specified amount of blood has been removed from your body, the collection bag and tubing will be clamped.  The needle will be removed from your vein.  Pressure will be held on the site of the needle insertion to stop the bleeding.  A bandage (dressing) will be placed over the needle insertion site. The procedure may vary among health care providers and hospitals. What happens after the procedure?  Your blood pressure, pulse rate, and breathing rate will be   measured after the procedure.  You will be encouraged to drink fluids.  Your recovery will be assessed and monitored.  You can return to your normal activities as told by your health care provider. Summary  Therapeutic phlebotomy is the planned removal of blood from a person's body for the purpose of treating a medical condition.  Therapeutic  phlebotomy may be used to treat hemochromatosis, polycythemia vera, porphyria cutanea tarda, or sickle cell disease.  In the procedure, a needle is inserted and about a pint (470 mL, or 0.47 L) of blood is removed. The average adult has 9-12 pints (4.3-5.7 L) of blood in the body.  This is generally a safe procedure, but it can sometimes cause problems such as nausea, light-headedness, or low blood pressure (hypotension). This information is not intended to replace advice given to you by your health care provider. Make sure you discuss any questions you have with your health care provider. Document Released: 02/24/2011 Document Revised: 10/08/2017 Document Reviewed: 10/08/2017 Elsevier Interactive Patient Education  2019 [employer]  

## 2018-11-01 NOTE — Progress Notes (Signed)
Hematology and Oncology Follow Up Visit  Kevin Wiggins [id number] 1961/08/21 58 y.o. 11/01/2018   Principle Diagnosis:  1.  Hemochromatosis (homozygous for H63D mutation). 2. Hypogonadism secondary to hemochromatosis. 3. Erythrocytosis secondary to testosterone therapy. 4. Ruptured right sinus of Valsalva aneurysm.  Current Therapy:   1. The patient status post cardiac surgery to repair the sinus of     Valsalva aneurysm. 2. Phlebotomy to keep hematocrit below 48%. 3. Testosterone 200 mg IM q.2 weeks.     Interim History:  [name] is back for followup.  He is really having a hard time with his right shoulder and arm.  He sees his orthopedist next Monday.  He apparently had a sneezing attack.  He felt a pop in his right shoulder.  He has a very hard time extending his right arm.  He has a hard time raising his right arm above 90 degrees but he can.  His blood sugar is 341 today.  This is also an issue.  I will go ahead and give him some insulin in the office today.  He is not on insulin at home.  Maybe, his family doctor will be able to help with his diabetes.  I will give him 10 units of insulin subcu.  He also needs to be phlebotomized today.  His hematocrit is 49.7%.  He cannot work because of his right shoulder.  He has had no fever.  He has had no nausea or vomiting.  He has had no rashes.  Thankfully, his heart is doing okay.  He does have the hypergonadism.  He does take testosterone at home.  Back in November, his testosterone level was 324.  There has been no issues with respect to headache.  He has had no mouth sores.    Currently, his performance status is ECOG 1.    Medications:  Current Outpatient Medications:  .  atorvastatin (LIPITOR) 20 MG tablet, TAKE 1 TABLET (20 MG TOTAL) BY MOUTH DAILY., Disp: 30 tablet, Rfl: 0 .  HYDROcodone-acetaminophen (NORCO/VICODIN) 5-325 MG tablet, hydrocodone 5 mg-acetaminophen 325 mg tablet  TAKE 1 TABLET BY MOUTH EVERY 6 (SIX)  HOURS AS NEEDED FOR UP TO 5 DAYS., Disp: , Rfl:  .  liraglutide (VICTOZA) 18 MG/3ML SOPN, 0.6 mg injection once daily for 1 week.  Then may increase injection to 1.2 mg daily., Disp: 3 pen, Rfl: 3 .  losartan (COZAAR) 100 MG tablet, TAKE 1 TABLET BY MOUTH ONCE DAILY, Disp: 90 tablet, Rfl: 0 .  metFORMIN (GLUCOPHAGE) 500 MG tablet, TAKE 2 TABLETS BY MOUTH 2 TIMES A DAY WITH A MEAL, Disp: 120 tablet, Rfl: 2 .  omeprazole (PRILOSEC) 20 MG capsule, Take 20 mg by mouth every evening. , Disp: , Rfl:  .  testosterone cypionate (DEPOTESTOSTERONE CYPIONATE) 200 MG/ML injection, Inject 1 mL (200 mg total) into the muscle every 14 (fourteen) days. As needed now (Patient taking differently: Inject 200 mg into the muscle every 14 (fourteen) days. As needed now), Disp: 10 mL, Rfl: 3 .  traMADol (ULTRAM) 50 MG tablet, Take 1-2 tablets (50-100 mg total) by mouth every 6 (six) hours as needed., Disp: 30 tablet, Rfl: 0 .  vardenafil (LEVITRA) 20 MG tablet, Take 1 tablet (20 mg total) by mouth daily as needed for erectile dysfunction. (Patient taking differently: Take 20 mg by mouth daily as needed for erectile dysfunction. ), Disp: 10 tablet, Rfl: 0  Current Facility-Administered Medications:  .  insulin regular (NOVOLIN R,HUMULIN R) 100 units/mL injection  10 Units, 10 Units, Subcutaneous, Once, Shonte Beutler, Rudell Cobb, MD  Allergies:  Allergies  Allergen Reactions  . Amlodipine Other (See Comments)    Pt reports dizziness and sleepy.  Pt reports dizziness and sleepy.   . Lisinopril Other (See Comments)    cough cough    Past Medical History, Surgical history, Social history, and Family History were reviewed and updated.  Review of Systems:   Review of Systems  Constitutional: Negative.   HENT: Negative.   Eyes: Negative.   Respiratory: Negative.   Cardiovascular: Negative.   Gastrointestinal: Negative.   Genitourinary: Negative.   Musculoskeletal: Positive for joint pain.  Skin: Negative.    Neurological: Negative.  Negative for dizziness.  Endo/Heme/Allergies: Negative.   Psychiatric/Behavioral: Negative.        Physical Exam:  weight is 257 lb 4 oz (116.7 kg). His oral temperature is 98.6 F (37 C). His blood pressure is 143/81 (abnormal) and his pulse is 89. His respiration is 18 and oxygen saturation is 97%.   Physical Exam Vitals signs reviewed.  HENT:     Head: Normocephalic and atraumatic.  Eyes:     Pupils: Pupils are equal, round, and reactive to light.  Neck:     Musculoskeletal: Normal range of motion.  Cardiovascular:     Rate and Rhythm: Normal rate and regular rhythm.     Heart sounds: Normal heart sounds.  Pulmonary:     Effort: Pulmonary effort is normal.     Breath sounds: Normal breath sounds.  Abdominal:     General: Bowel sounds are normal.     Palpations: Abdomen is soft.  Musculoskeletal: Normal range of motion.        General: No tenderness or deformity.     Comments: Extremities shows marked decreased range of motion of the right shoulder.  He cannot do abduction greater than 45 degrees.  He has no internal or external rotation of the right shoulder.  Lymphadenopathy:     Cervical: No cervical adenopathy.  Skin:    General: Skin is warm and dry.     Findings: No erythema or rash.  Neurological:     Mental Status: He is alert and oriented to person, place, and time.  Psychiatric:        Behavior: Behavior normal.        Thought Content: Thought content normal.        Judgment: Judgment normal.      Lab Results  Component Value Date   WBC 7.4 11/01/2018   HGB 17.3 (H) 11/01/2018   HCT 49.3 11/01/2018   MCV 91.0 11/01/2018   PLT 193 11/01/2018     Chemistry      Component Value Date/Time   NA 131 (L) 11/01/2018 0914   NA 141 09/07/2017 1503   NA 138 05/20/2017 1050   K 4.3 11/01/2018 0914   K 4.1 09/07/2017 1503   K 4.6 05/20/2017 1050   CL 95 (L) 11/01/2018 0914   CL 100 09/07/2017 1503   CO2 27 11/01/2018 0914    CO2 29 09/07/2017 1503   CO2 26 05/20/2017 1050   BUN 16 11/01/2018 0914   BUN 12 09/07/2017 1503   BUN 16.7 05/20/2017 1050   CREATININE 1.03 11/01/2018 0914   CREATININE 1.1 09/07/2017 1503   CREATININE 1.3 05/20/2017 1050      Component Value Date/Time   CALCIUM 9.8 11/01/2018 0914   CALCIUM 9.2 09/07/2017 1503   CALCIUM 10.0 05/20/2017 1050   ALKPHOS  56 11/01/2018 0914   ALKPHOS 57 09/07/2017 1503   ALKPHOS 60 05/20/2017 1050   AST 22 11/01/2018 0914   AST 42 (H) 05/20/2017 1050   ALT 49 (H) 11/01/2018 0914   ALT 57 (H) 09/07/2017 1503   ALT 69 (H) 05/20/2017 1050   BILITOT 0.9 11/01/2018 0914   BILITOT 0.95 05/20/2017 1050         Impression and Plan: Mr. Matton is 58 year old white male.[name] He has a homozygous mutation for one of the minor genes for hemochromatosis.    We will definitely phlebotomize him today.    My concern with Mr. Majano is his blood sugars.  I really think this is going to cause him real problems down the road if his blood sugar is not under better control.  I would like to see him back in a month.  I think we have to stay on top of him and monitor him closely.  Hopefully when he sees his orthopedist next week, his right shoulder will improve.  [name], MD 1/27/202011:03 AM

## 2018-11-01 NOTE — Progress Notes (Signed)
Kevin Wiggins presents today for phlebotomy per MD orders. Phlebotomy procedure started at 1120 with 16 g catheter and ended at 1130. 517 grams removed. Patient observed for 30 minutes after procedure without any incident. Patient tolerated procedure well. IV needle removed intact.

## 2018-11-02 ENCOUNTER — Telehealth: Payer: Self-pay | Admitting: Medical

## 2018-11-02 LAB — TESTOSTERONE: Testosterone: 298 ng/dL (ref 264–916)

## 2018-11-02 NOTE — Telephone Encounter (Addendum)
Your sugar was very high recently at Dr Marin Olp office.  I want you to make appointment with me to discuss option and get fasting lipid panel, cmp and 3 month blood sugar average. You got insulin the other day at Dr. Dicie Beam due to very high sugar. I am now thinking with sugar level of 341 recently that it may be good idea to start insulin or refer you to endocrinologist.

## 2018-11-10 ENCOUNTER — Encounter: Payer: Self-pay | Admitting: Medical

## 2018-11-10 ENCOUNTER — Ambulatory Visit: Payer: 59 | Admitting: Medical

## 2018-11-10 VITALS — BP 135/80 | HR 97 | Temp 98.3°F | Resp 14 | Ht 69.0 in | Wt 256.0 lb

## 2018-11-10 DIAGNOSIS — E785 Hyperlipidemia, unspecified: Secondary | ICD-10-CM | POA: Diagnosis not present

## 2018-11-10 DIAGNOSIS — E119 Type 2 diabetes mellitus without complications: Secondary | ICD-10-CM

## 2018-11-10 DIAGNOSIS — I10 Essential (primary) hypertension: Secondary | ICD-10-CM

## 2018-11-10 LAB — COMPREHENSIVE METABOLIC PANEL
ALT: 48 U/L (ref 0–53)
AST: 22 U/L (ref 0–37)
Albumin: 4.3 g/dL (ref 3.5–5.2)
Alkaline Phosphatase: 56 U/L (ref 39–117)
BUN: 14 mg/dL (ref 6–23)
CO2: 26 mEq/L (ref 19–32)
Calcium: 9.8 mg/dL (ref 8.4–10.5)
Chloride: 99 mEq/L (ref 96–112)
Creatinine, Ser: 1.05 mg/dL (ref 0.40–1.50)
GFR: 72.5 mL/min (ref >60.00)
Glucose, Bld: 243 mg/dL — ABNORMAL HIGH (ref 70–99)
Potassium: 4.7 mEq/L (ref 3.5–5.1)
Sodium: 137 mEq/L (ref 135–145)
Total Bilirubin: 0.8 mg/dL (ref 0.2–1.2)
Total Protein: 6.8 g/dL (ref 6.0–8.3)

## 2018-11-10 LAB — LIPID PANEL
CHOL/HDL RATIO: 3
Cholesterol: 134 mg/dL (ref 0–200)
HDL: 49 mg/dL (ref >39.00)
LDL Cholesterol: 61 mg/dL (ref 0–99)
NonHDL: 85.12
TRIGLYCERIDES: 120 mg/dL (ref 0.0–149.0)
VLDL: 24 mg/dL (ref 0.0–40.0)

## 2018-11-10 LAB — HEMOGLOBIN A1C: Hgb A1c MFr Bld: 11.7 % — ABNORMAL HIGH (ref 4.6–6.5)

## 2018-11-10 NOTE — Progress Notes (Signed)
Subjective:    Patient ID: Kevin Wiggins, male    DOB: [birth year], 59 y.o.   MRN: [id number]  HPI  Pt in for follow up.  He is still having pain in rt shoulder. Pt saw surgeon yesterday and advise presently was to hold PT presently.  Pt reminds me that how bicep tendon popped. He states was leaning over and sneezed. He now has pain proximal bicep and anterior shoulder. Ortho gave him oxycodone.  Pt sugars have been very high recently. He had very high sugar the other day at Dr. Marin Olp office. Sugar level was 341. Dr. Marin Olp office gave one dose insulin. Pt states in past sugars may have been better since he was very active at work. But since shoulder injury he was unable to work. He was walking more than 10,000 steps a day.Pt is currently on victoza and metformin.  Pt bp initially high today. But better on check. No cardiac or neuro signs or symptoms.  He has history of high cholesterol.  Pt is fasting today.      Review of Systems  Constitutional: Negative for chills, fatigue and fever.  HENT: Negative for congestion, drooling, postnasal drip and rhinorrhea.   Respiratory: Negative for cough, chest tightness, shortness of breath and wheezing.   Cardiovascular: Negative for chest pain and palpitations.  Gastrointestinal: Negative for abdominal pain, constipation, nausea and vomiting.  Musculoskeletal: Negative for back pain, myalgias and neck stiffness.  Skin: Negative for rash.  Neurological: Negative for dizziness, weakness, light-headedness and numbness.  Hematological: Negative for adenopathy. Does not bruise/bleed easily.  Psychiatric/Behavioral: Negative for behavioral problems, confusion, dysphoric mood and suicidal ideas. The patient is not nervous/anxious and is not hyperactive.     Past Medical History:  Diagnosis Date  . CAD (coronary artery disease) cardiologist-  dr dalton mclean   a. 07/2013: mild nonobstructive CAD (30% RCA)  by cath 07/25/13 as part of eval  for VSD/SOB.  . CKD (chronic kidney disease), stage II   . Diastolic CHF ([place])    a. Acute diastolic CHF 02/3975 in the setting of perimembranous VSD. Eval underway given mod pulm HTN and RV dilation.  . ED (erectile dysfunction)   . GERD (gastroesophageal reflux disease)   . Heart murmur   . Hereditary hemochromatosis [facility]) hemotologist/ oncologist-  dr Marin Olp   H63D Homozyous mutation--  treatment phlebotomy (last one 12-03-2016) for HCT < 48%  . Hyperlipidemia   . Hypertension   . LAFB (left anterior fascicular block)   . Mass of soft tissue of right upper extremity   . OSA (obstructive sleep apnea)    severe osa per study 11-17-2013 --  no cpap/  per pt uses Breath Right strips  . RBBB   . Refusal of blood transfusions as patient is Jehovah's Witness   . 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 Secondary erythrocytosis    due to testosterone therapy  . Secondary testicular hypogonadism    due to hemochromatosis  . Type 2 diabetes mellitus, uncontrolled ([place])    followed by pcp--- last A1c 11.0 on 06-05-2017/  on 07-14-2017 pt stated does not check CBGs, asked pt about A1c being high he stated "doctor told him to watch carbs and did not add medication"     Social History   Socioeconomic History  . Marital status: Married  Spouse name: Not on file  . Number of children: Not on file  . Years of education: Not on file  . 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 Financial resource strain: Not on file  . Food insecurity:    Worry: Not on file    Inability: Not on file  . Transportation needs:    Medical: Not on file    Non-medical: Not on file  Tobacco Use  . Smoking status: Never Smoker  . Smokeless tobacco: Never Used  Substance and Sexual Activity  . Alcohol  use: No    Alcohol/week: 0.0 standard drinks  . Drug use: No  . Sexual activity: Not on file  Lifestyle  . Physical activity:    Days per week: Not on file    Minutes per session: Not on file  . Stress: Not on file  Relationships  . Social connections:    Talks on phone: Not on file    Gets together: Not on file    Attends religious service: Not on file    Active member of club or organization: Not on file    Attends meetings of clubs or organizations: Not on file    Relationship status: Not on file  . Intimate partner violence:    Fear of current or ex partner: Not on file    Emotionally abused: Not on file    Physically abused: Not on file    Forced sexual activity: Not on file  Other Topics Concern  . Not on file  Social History Narrative  . Not on file    Past Surgical History:  Procedure Laterality Date  . LEFT AND RIGHT HEART CATHETERIZATION WITH CORONARY ANGIOGRAM N/A 07/25/2013   Procedure: LEFT AND RIGHT HEART CATHETERIZATION WITH CORONARY ANGIOGRAM;  Surgeon: Jolaine Artist, MD;  Location: [facility] CATH LAB;  Service: Cardiovascular;  Laterality: N/A; patent coronary arteries w/ non-obstructive CAD (30% RCA); moderate pulmonary HTN;  O2 saturations consistent w/ significant left-right intracardiac shunt  . MASS EXCISION Right 07/17/2017   Procedure: REMOVAL OF RIGHT UPPER ARM SOFT TISSUE MASS;  Surgeon: Jackolyn Confer, MD;  Location: [place];  Service: General;  Laterality: Right;  . REPAIR SINUS VALSALVA ANEURYSM W/ CPB (STERNOTOMY)  08-26-2013   @[facility]    "patched"  . TEE WITHOUT CARDIOVERSION N/A 07/25/2013   Procedure: TRANSESOPHAGEAL ECHOCARDIOGRAM (TEE);  Surgeon: Dorothy Spark, MD;  Location: [facility] ENDOSCOPY;  Service: Cardiovascular;  Laterality: N/A; moderate dilated RV w/ mild decreased function; a small restrictive perimembranous VSD, peak velocity 4.56m/sec, peak gradient 83mmHg/ trivial AI/  trivial TR/ mild LAE/ no PFO/  mild aortic  atherosclerotic plaque  . TRANSTHORACIC ECHOCARDIOGRAM  10-27-2013   dr Aundra Dubin   mild LVH, ef 55-60%/  mild LAE/  sinuses of valsalva appear normal/  PASP could not be accurately estimated (per dr Aundra Dubin stable s/p sinus of valsalva fistula repair)    Family History  Problem Relation Age of Onset  . Cancer Mother   . Hypertension Father   . Congestive Heart Failure Father   . Colon cancer Paternal Aunt   . Hemochromatosis Unknown        family history    Allergies  Allergen Reactions  . Amlodipine Other (See Comments)    Pt reports dizziness and sleepy.  Pt reports dizziness and sleepy.   . Lisinopril Other (See Comments)  cough cough    Current Outpatient Medications on File Prior to Visit  Medication Sig Dispense Refill  . atorvastatin (LIPITOR) 20 MG tablet TAKE 1 TABLET (20 MG TOTAL) BY MOUTH DAILY. 30 tablet 0  . HYDROcodone-acetaminophen (NORCO/VICODIN) 5-325 MG tablet hydrocodone 5 mg-acetaminophen 325 mg tablet  TAKE 1 TABLET BY MOUTH EVERY 6 (SIX) HOURS AS NEEDED FOR UP TO 5 DAYS.    [name] liraglutide (VICTOZA) 18 MG/3ML SOPN 0.6 mg injection once daily for 1 week.  Then may increase injection to 1.2 mg daily. 3 pen 3  . losartan (COZAAR) 100 MG tablet TAKE 1 TABLET BY MOUTH ONCE DAILY 90 tablet 0  . metFORMIN (GLUCOPHAGE) 500 MG tablet TAKE 2 TABLETS BY MOUTH 2 TIMES A DAY WITH A MEAL 120 tablet 2  . omeprazole (PRILOSEC) 20 MG capsule Take 20 mg by mouth every evening.     . testosterone cypionate (DEPOTESTOSTERONE CYPIONATE) 200 MG/ML injection Inject 1 mL (200 mg total) into the muscle every 14 (fourteen) days. As needed now (Patient taking differently: Inject 200 mg into the muscle every 14 (fourteen) days. As needed now) 10 mL 3  . traMADol (ULTRAM) 50 MG tablet Take 1-2 tablets (50-100 mg total) by mouth every 6 (six) hours as needed. 30 tablet 0  . vardenafil (LEVITRA) 20 MG tablet Take 1 tablet (20 mg total) by mouth daily as needed for erectile dysfunction.  (Patient taking differently: Take 20 mg by mouth daily as needed for erectile dysfunction. ) 10 tablet 0   No current facility-administered medications on file prior to visit.     BP 135/80   Pulse 97   Temp 98.3 F (36.8 C)   Resp 14   Ht 5\' 9"  (1.753 m)   Wt 256 lb (116.1 kg)   SpO2 96%   BMI 37.80 kg/m       Objective:   Physical Exam  General Mental Status- Alert. General Appearance- Not in acute distress.   Skin General: Color- Normal Color. Moisture- Normal Moisture.   Chest and Lung Exam Auscultation: Breath Sounds:-Normal.  Cardiovascular Auscultation:Rythm- Regular. Murmurs & Other Heart Sounds:Auscultation of the heart reveals- No Murmurs.  Abdomen Inspection:-Inspeection Normal. Palpation/Percussion:Note:No mass. Palpation and Percussion of the abdomen reveal- Non Tender, Non Distended + BS, no rebound or guarding.  Neurologic Cranial Nerve exam:- CN III-XII intact(No nystagmus), symmetric smile. Strength:- 5/5 equal and symmetric strength both upper and lower extremities.  Feet- see quality metrics.      Assessment & Plan:  For your diabetes, htn and high cholesterol, I ordered a1c, cmp and lipid panel today.  Very important to control your risk factors as best as possible. Your 10 year cardiovascular risk score is below.  The 10-year ASCVD risk score Mikey Bussing DC Brooke Bonito., et al., 2013) is: 21.3%   Values used to calculate the score:     Age: 62 years     Sex: Male     Is Non-Hispanic African American: No     Diabetic: Yes     Tobacco smoker: No     Systolic Blood Pressure: 832 mmHg     Is BP treated: Yes     HDL Cholesterol: 41.9 mg/dL     Total Cholesterol: 219 mg/dL   After lab review might switch your statin to crestor. Also if sugar very high might consider referral to endocrinologist.  Try to get some daily exercise/walking.  Follow up date 3 months or sooner depending on lab review.  Mackie Pai, PA-C

## 2018-11-10 NOTE — Patient Instructions (Signed)
For your diabetes, htn and high cholesterol, I ordered a1c, cmp and lipid panel today.  Very important to control your risk factors as best as possible. Your 10 year cardiovascular risk score is below.  The 10-year ASCVD risk score Mikey Bussing DC Brooke Bonito., et al., 2013) is: 21.3%   Values used to calculate the score:     Age: 58 years     Sex: Male     Is Non-Hispanic African American: No     Diabetic: Yes     Tobacco smoker: No     Systolic Blood Pressure: 102 mmHg     Is BP treated: Yes     HDL Cholesterol: 41.9 mg/dL     Total Cholesterol: 219 mg/dL   After lab review might switch your statin to crestor. Also if sugar very high might consider referral to endocrinologist.  Try to get some daily exercise/walking.  Follow up date 3 months or sooner depending on lab review.

## 2018-11-11 ENCOUNTER — Telehealth: Payer: Self-pay | Admitting: Medical

## 2018-11-11 NOTE — Telephone Encounter (Signed)
Copied from [place]. Topic: Quick Communication - Lab Results (Clinic Use ONLY) >> Nov 11, 2018 11:26 AM Hinton Dyer, CMA wrote: Called patient to inform them of 03/11/19 lab results. When patient returns call, triage nurse may disclose results. >> Nov 11, 2018 12:18 PM Wynetta Emery, [place] C wrote: Pt is returning call for results.

## 2018-11-22 ENCOUNTER — Other Ambulatory Visit: Payer: Self-pay | Admitting: Medical

## 2018-11-22 MED FILL — ATORVASTATIN 20 MG TABLET: 20 | 30 days supply | Qty: 30 | Fill #0

## 2018-11-22 MED FILL — metFORMIN HCL 500 MG TABS: 500 | 30 days supply | Qty: 120 | Fill #1

## 2018-11-22 MED FILL — LOSARTAN POTASSIUM 100 MG T: 100 | 30 days supply | Qty: 30 | Fill #1

## 2018-11-25 MED FILL — VICTOZA 2-PAK 18 MG/3 ML PE: 18 | 30 days supply | Qty: 6 | Fill #3

## 2018-12-23 ENCOUNTER — Other Ambulatory Visit: Payer: Self-pay | Admitting: Medical

## 2018-12-23 MED FILL — VICTOZA 2-PAK 18 MG/3 ML PE: 18 | 30 days supply | Qty: 6 | Fill #4

## 2018-12-23 MED FILL — LOSARTAN POTASSIUM 100 MG T: 100 | 30 days supply | Qty: 30 | Fill #2

## 2018-12-23 MED FILL — ATORVASTATIN 20 MG TABLET: 20 | 30 days supply | Qty: 30 | Fill #0

## 2019-01-04 MED FILL — metFORMIN HCL 500 MG TABS: 500 | 30 days supply | Qty: 120 | Fill #2

## 2019-01-21 MED FILL — VICTOZA 2-PAK 18 MG/3 ML PE: 18 | 30 days supply | Qty: 6 | Fill #5

## 2019-01-28 ENCOUNTER — Other Ambulatory Visit: Payer: Self-pay | Admitting: Medical

## 2019-01-28 MED FILL — LOSARTAN POTASSIUM 100 MG T: 100 | 30 days supply | Qty: 30 | Fill #0

## 2019-01-28 MED FILL — ATORVASTATIN 20 MG TABLET: 20 | 30 days supply | Qty: 30 | Fill #0

## 2019-02-10 ENCOUNTER — Other Ambulatory Visit: Payer: Self-pay | Admitting: Medical

## 2019-02-10 MED FILL — metFORMIN HCL 500 MG TABS: 500 | 30 days supply | Qty: 120 | Fill #0

## 2019-02-14 ENCOUNTER — Other Ambulatory Visit: Payer: Self-pay | Admitting: Medical

## 2019-02-14 ENCOUNTER — Telehealth: Payer: Self-pay

## 2019-02-14 MED FILL — VICTOZA 2-PAK 18 MG/3 ML PE: 18 | 30 days supply | Qty: 6 | Fill #0

## 2019-02-14 NOTE — Telephone Encounter (Signed)
Received a refill request from Pt's pharmacy. It is time for pt to f/u with PCP and was going to offer a virtual visit for follow up on DM, HTN, and HLD. No answer and VM was full. No MyChart.

## 2019-03-02 ENCOUNTER — Other Ambulatory Visit: Payer: Self-pay | Admitting: Medical

## 2019-03-02 MED FILL — LOSARTAN POTASSIUM 100 MG T: 100 | 30 days supply | Qty: 30 | Fill #1

## 2019-03-03 MED FILL — ATORVASTATIN 20 MG TABLET: 20 | 30 days supply | Qty: 30 | Fill #0

## 2019-03-23 ENCOUNTER — Other Ambulatory Visit: Payer: Self-pay | Admitting: Medical

## 2019-03-23 MED FILL — VICTOZA 2-PAK 18 MG/3 ML PE: 18 | 15 days supply | Qty: 3 | Fill #1

## 2019-03-23 MED FILL — metFORMIN HCL 500 MG TABS: 500 | 30 days supply | Qty: 120 | Fill #1

## 2019-04-08 ENCOUNTER — Other Ambulatory Visit: Payer: Self-pay | Admitting: Medical

## 2019-04-08 MED FILL — LOSARTAN POTASSIUM 100 MG T: 100 | 30 days supply | Qty: 30 | Fill #2

## 2019-04-11 MED FILL — ATORVASTATIN 20 MG TABLET: 20 | 30 days supply | Qty: 30 | Fill #0

## 2019-04-25 MED FILL — ATORVASTATIN 20 MG TABLET: 20 | 30 days supply | Qty: 30 | Fill #0

## 2019-04-25 MED FILL — metFORMIN HCL 500 MG TABS: 500 | 30 days supply | Qty: 120 | Fill #2

## 2019-04-25 MED FILL — VICTOZA 2-PAK 18 MG/3 ML PE: 18 | 30 days supply | Qty: 6 | Fill #0

## 2019-05-19 ENCOUNTER — Other Ambulatory Visit: Payer: Self-pay | Admitting: Medical

## 2019-05-20 MED FILL — LOSARTAN POTASSIUM 100 MG T: 100 | 30 days supply | Qty: 30 | Fill #0

## 2019-05-20 NOTE — Telephone Encounter (Signed)
Pt's needs follow up appointment.

## 2019-05-23 ENCOUNTER — Other Ambulatory Visit: Payer: Self-pay

## 2019-05-23 NOTE — Telephone Encounter (Signed)
Pt is scheduled for 8/19

## 2019-05-25 ENCOUNTER — Other Ambulatory Visit: Payer: Self-pay

## 2019-05-25 ENCOUNTER — Encounter: Payer: Self-pay | Admitting: Medical

## 2019-05-25 ENCOUNTER — Ambulatory Visit (INDEPENDENT_AMBULATORY_CARE_PROVIDER_SITE_OTHER): Payer: 59 | Admitting: Medical

## 2019-05-25 VITALS — BP 140/85 | HR 74 | Temp 97.1°F | Resp 16 | Ht 69.0 in | Wt 253.2 lb

## 2019-05-25 DIAGNOSIS — Z125 Encounter for screening for malignant neoplasm of prostate: Secondary | ICD-10-CM

## 2019-05-25 DIAGNOSIS — E119 Type 2 diabetes mellitus without complications: Secondary | ICD-10-CM | POA: Diagnosis not present

## 2019-05-25 DIAGNOSIS — N529 Male erectile dysfunction, unspecified: Secondary | ICD-10-CM

## 2019-05-25 DIAGNOSIS — E785 Hyperlipidemia, unspecified: Secondary | ICD-10-CM | POA: Diagnosis not present

## 2019-05-25 DIAGNOSIS — I10 Essential (primary) hypertension: Secondary | ICD-10-CM | POA: Diagnosis not present

## 2019-05-25 DIAGNOSIS — M25511 Pain in right shoulder: Secondary | ICD-10-CM | POA: Diagnosis not present

## 2019-05-25 LAB — CBC WITH DIFFERENTIAL/PLATELET
Basophils Absolute: 0.1 10*3/uL (ref 0.0–0.1)
Basophils Relative: 1.2 % (ref 0.0–3.0)
Eosinophils Absolute: 0.4 10*3/uL (ref 0.0–0.7)
Eosinophils Relative: 6 % — ABNORMAL HIGH (ref 0.0–5.0)
HCT: 47.9 % (ref 39.0–52.0)
Hemoglobin: 16.6 g/dL (ref 13.0–17.0)
Lymphocytes Relative: 23 % (ref 12.0–46.0)
Lymphs Abs: 1.6 10*3/uL (ref 0.7–4.0)
MCHC: 34.6 g/dL (ref 30.0–36.0)
MCV: 93.5 fl (ref 78.0–100.0)
Monocytes Absolute: 0.6 10*3/uL (ref 0.1–1.0)
Monocytes Relative: 8.4 % (ref 3.0–12.0)
Neutro Abs: 4.3 10*3/uL (ref 1.4–7.7)
Neutrophils Relative %: 61.4 % (ref 43.0–77.0)
Platelets: 184 10*3/uL (ref 150.0–400.0)
RBC: 5.12 Mil/uL (ref 4.22–5.81)
RDW: 12.7 % (ref 11.5–15.5)
WBC: 7 10*3/uL (ref 4.0–10.5)

## 2019-05-25 MED ORDER — SILDENAFIL CITRATE 100 MG PO TABS: 50.0000 mg | ORAL_TABLET | Freq: Every day | ORAL | 3 refills | Status: DC | PRN

## 2019-05-25 MED ORDER — LOSARTAN POTASSIUM 100 MG PO TABS: 100.0000 mg | ORAL_TABLET | Freq: Every day | ORAL | 0 refills | Status: DC

## 2019-05-25 MED FILL — SILDENAFIL CITRATE 100 MG T: 100 | 5 days supply | Qty: 5 | Fill #0

## 2019-05-25 NOTE — Patient Instructions (Addendum)
Blood pressure is borderline presently but not on medication presently since Friday.  Refilled losartan today.  For diabetes, will get metabolic panel and O7S.  Will follow results and decide on potential medication changes.  Desire to stop metformin due to perceived potential mild memory changes. Will need to investigate research on memory side effects. May need mini-mental status exam on follow up.  For high cholesterol, will get lipid panel today.  For right shoulder, patient has upcoming scheduled shoulder surgery .  If surgeon wants clearance prior to surgery let me know and we can get a EKG and fax it to cardiology.  For ED rx viagra. Rx advisement given.  Follow-up date to be determined after lab review.

## 2019-05-25 NOTE — Progress Notes (Addendum)
Subjective:    Patient ID: Kevin Wiggins, male    DOB: 07/06/1961, 58 y.o.   MRN: [id number]  HPI    Pt in for follow up.   Pt update he will have surgery on 28 th.    Pt ran out of chronic meds on Friday.  Pt is fasting. He has hx htn, hyperlipidemia and diabetes.   Pt thinks metformin maybe effecting memory. But describes minimal effect He is also on vicotoza for diabetes. Compliant on both meds. His mom told him metformin can effect memory.  Pt has not seen his cardiologist recently.    Review of Systems  Constitutional: Negative for chills, fatigue and fever.  Respiratory: Negative for chest tightness, shortness of breath and wheezing.   Cardiovascular: Negative for chest pain and palpitations.  Gastrointestinal: Negative for abdominal pain.  Neurological: Negative for dizziness, seizures, speech difficulty, weakness and light-headedness.       Mild rare transient ha. Pt thinks stress related. Family issues. Kevin Wiggins occurs when having to deal with stressful situation regarding step daughter who is 79 and live with him and his wife.  Hematological: Negative for adenopathy. Does not bruise/bleed easily.  Psychiatric/Behavioral: Negative for decreased concentration. The patient is not nervous/anxious.     Past Medical History:  Diagnosis Date  . CAD (coronary artery disease) cardiologist-  dr dalton mclean   a. 07/2013: mild nonobstructive CAD (30% RCA)  by cath 07/25/13 as part of eval for VSD/SOB.  . CKD (chronic kidney disease), stage II   . Diastolic CHF ([place])    a. Acute diastolic CHF 05/6577 in the setting of perimembranous VSD. Eval underway given mod pulm HTN and RV dilation.  . ED (erectile dysfunction)   . GERD (gastroesophageal reflux disease)   . Heart murmur   . Hereditary hemochromatosis [facility]) hemotologist/ oncologist-  dr Marin Olp   H63D Homozyous mutation--  treatment phlebotomy (last one 12-03-2016) for HCT < 48%  . Hyperlipidemia   . Hypertension   .  LAFB (left anterior fascicular block)   . Mass of soft tissue of right upper extremity   . OSA (obstructive sleep apnea)    severe osa per study 11-17-2013 --  no cpap/  per pt uses Breath Right strips  . RBBB   . Refusal of blood transfusions as patient is Jehovah's Witness   . 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 Secondary erythrocytosis    due to testosterone therapy  . Secondary testicular hypogonadism    due to hemochromatosis  . Type 2 diabetes mellitus, uncontrolled ([place])    followed by pcp--- last A1c 11.0 on 06-05-2017/  on 07-14-2017 pt stated does not check CBGs, asked pt about A1c being high he stated "doctor told him to watch carbs and did not add medication"     Social History   Socioeconomic History  . Marital status: Married    Spouse name: Not on file  . Number of children: Not on file  . Years of education: Not on file  . 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 Financial resource strain: Not on file  . Food insecurity    Worry: Not on file    Inability: Not on file  . Transportation needs  Medical: Not on file    Non-medical: Not on file  Tobacco Use  . Smoking status: Never Smoker  . Smokeless tobacco: Never Used  Substance and Sexual Activity  . Alcohol use: No    Alcohol/week: 0.0 standard drinks  . Drug use: No  . Sexual activity: Not on file  Lifestyle  . Physical activity    Days per week: Not on file    Minutes per session: Not on file  . Stress: Not on file  Relationships  . Social Herbalist on phone: Not on file    Gets together: Not on file    Attends religious service: Not on file    Active member of club or organization: Not on file    Attends meetings of clubs or organizations: Not on file    Relationship  status: Not on file  . Intimate partner violence    Fear of current or ex partner: Not on file    Emotionally abused: Not on file    Physically abused: Not on file    Forced sexual activity: Not on file  Other Topics Concern  . Not on file  Social History Narrative  . Not on file    Past Surgical History:  Procedure Laterality Date  . LEFT AND RIGHT HEART CATHETERIZATION WITH CORONARY ANGIOGRAM N/A 07/25/2013   Procedure: LEFT AND RIGHT HEART CATHETERIZATION WITH CORONARY ANGIOGRAM;  Surgeon: Jolaine Artist, MD;  Location: [facility] CATH LAB;  Service: Cardiovascular;  Laterality: N/A; patent coronary arteries w/ non-obstructive CAD (30% RCA); moderate pulmonary HTN;  O2 saturations consistent w/ significant left-right intracardiac shunt  . MASS EXCISION Right 07/17/2017   Procedure: REMOVAL OF RIGHT UPPER ARM SOFT TISSUE MASS;  Surgeon: Jackolyn Confer, MD;  Location: [place];  Service: General;  Laterality: Right;  . REPAIR SINUS VALSALVA ANEURYSM W/ CPB (STERNOTOMY)  08-26-2013   @[facility]    "patched"  . TEE WITHOUT CARDIOVERSION N/A 07/25/2013   Procedure: TRANSESOPHAGEAL ECHOCARDIOGRAM (TEE);  Surgeon: Dorothy Spark, MD;  Location: [facility] ENDOSCOPY;  Service: Cardiovascular;  Laterality: N/A; moderate dilated RV w/ mild decreased function; a small restrictive perimembranous VSD, peak velocity 4.61m/sec, peak gradient 16mmHg/ trivial AI/  trivial TR/ mild LAE/ no PFO/  mild aortic atherosclerotic plaque  . TRANSTHORACIC ECHOCARDIOGRAM  10-27-2013   dr Aundra Dubin   mild LVH, ef 55-60%/  mild LAE/  sinuses of valsalva appear normal/  PASP could not be accurately estimated (per dr Aundra Dubin stable s/p sinus of valsalva fistula repair)    Family History  Problem Relation Age of Onset  . Cancer Mother   . Hypertension Father   . Congestive Heart Failure Father   . Colon cancer Paternal Aunt   . Hemochromatosis Unknown        family history    Allergies  Allergen Reactions   . Amlodipine Other (See Comments)    Pt reports dizziness and sleepy.  Pt reports dizziness and sleepy.   . Lisinopril Other (See Comments)    cough cough    Current Outpatient Medications on File Prior to Visit  Medication Sig Dispense Refill  . atorvastatin (LIPITOR) 20 MG tablet TAKE 1 TABLET BY MOUTH ONCE DAILY 30 tablet 0  . HYDROcodone-acetaminophen (NORCO/VICODIN) 5-325 MG tablet hydrocodone 5 mg-acetaminophen 325 mg tablet  TAKE 1 TABLET BY MOUTH EVERY 6 (SIX) HOURS AS NEEDED FOR UP TO 5 DAYS.    [name] liraglutide (VICTOZA) 18 MG/3ML SOPN INJECT 0.6 MG INJECTION ONCE DAILY  FOR 1 WEEK. THEN MAY INCREASE INJECTION TO 1.2 MG DAILY. 9 mL 0  . losartan (COZAAR) 100 MG tablet TAKE 1 TABLET BY MOUTH ONCE DAILY 30 tablet 0  . metFORMIN (GLUCOPHAGE) 500 MG tablet TAKE 2 TABLETS BY MOUTH 2 TIMES A DAY WITH A MEAL 120 tablet 2  . omeprazole (PRILOSEC) 20 MG capsule Take 20 mg by mouth every evening.     . testosterone cypionate (DEPOTESTOSTERONE CYPIONATE) 200 MG/ML injection Inject 1 mL (200 mg total) into the muscle every 14 (fourteen) days. As needed now (Patient taking differently: Inject 200 mg into the muscle every 14 (fourteen) days. As needed now) 10 mL 3  . traMADol (ULTRAM) 50 MG tablet Take 1-2 tablets (50-100 mg total) by mouth every 6 (six) hours as needed. 30 tablet 0  . vardenafil (LEVITRA) 20 MG tablet Take 1 tablet (20 mg total) by mouth daily as needed for erectile dysfunction. (Patient taking differently: Take 20 mg by mouth daily as needed for erectile dysfunction. ) 10 tablet 0   No current facility-administered medications on file prior to visit.     BP (!) 145/83 (BP Location: Right Arm, Cuff Size: Large)   Pulse 74   Temp (!) 97.1 F (36.2 C) (Temporal)   Resp 16   Ht 5\' 9"  (1.753 m)   Wt 253 lb 3.2 oz (114.9 kg)   SpO2 100%   BMI 37.39 kg/m       Objective:   Physical Exam  General Mental Status- Alert. General Appearance- Not in acute distress.   Skin  General: Color- Normal Color. Moisture- Normal Moisture.  Neck Carotid Arteries- Normal color. Moisture- Normal Moisture. No carotid bruits. No JVD.  Chest and Lung Exam Auscultation: Breath Sounds:-Normal.  Cardiovascular Auscultation:Rythm- Regular. Murmurs & Other Heart Sounds:Auscultation of the heart reveals- No Murmurs.  Abdomen Inspection:-Inspeection Normal. Palpation/Percussion:Note:No mass. Palpation and Percussion of the abdomen reveal- Non Tender, Non Distended + BS, no rebound or guarding.  Neurologic Cranial Nerve exam:- CN III-XII intact(No nystagmus), symmetric smile. Strength:- 5/5 equal and symmetric strength both upper and lower extremities.      Assessment & Plan:  Blood pressure is borderline presently but not on medication presently since Friday.  Refilled losartan today.  For diabetes, will get metabolic panel and W6F.  Will follow results and decide on potential medication changes.  Patient wants to stop metformin due to perceived potential mild memory changes.  I will need to review patient's chart see what is tried before.  In the past early on he mentioned medication that bottomed out his blood sugar and he did not want to use that.  Patient did slight memory issues at the very end of interview.  Will follow this closely in the future and if concern will do Mini-Mental status and consider possible referral to neurologist.  For high cholesterol, will get lipid panel today.  For right shoulder, patient has upcoming scheduled shoulder surgery .  If surgeon wants clearance prior to surgery let me know and we can get a EKG and fax it to cardiology.  For ED rx viagra. Rx advisement given.  Follow-up date to be determined after lab review.  25 minutes spent with pt. 50% of time spent counseling on dx and tx.  Mackie Pai, PA-C  Mackie Pai, PA-C

## 2019-05-26 MED FILL — VICTOZA 2-PAK 18 MG/3 ML PE: 18 | 15 days supply | Qty: 3 | Fill #1

## 2019-06-09 ENCOUNTER — Other Ambulatory Visit: Payer: Self-pay | Admitting: Medical

## 2019-06-09 MED FILL — VICTOZA 2-PAK 18 MG/3 ML PE: 18 | 30 days supply | Qty: 6 | Fill #0

## 2019-06-09 MED FILL — metFORMIN HCL 500 MG TABS: 500 | 30 days supply | Qty: 120 | Fill #0

## 2019-06-09 NOTE — Telephone Encounter (Signed)
[facility]

## 2019-06-09 NOTE — Telephone Encounter (Signed)
Patient came into the office to request medication refills or metformin and victoza. Patient is going out of town tomorrow and requests them as soon as possible. Pharmacy [facility].

## 2019-06-27 ENCOUNTER — Telehealth: Payer: Self-pay | Admitting: Medical

## 2019-06-27 DIAGNOSIS — E119 Type 2 diabetes mellitus without complications: Secondary | ICD-10-CM

## 2019-06-27 DIAGNOSIS — I10 Essential (primary) hypertension: Secondary | ICD-10-CM

## 2019-06-27 NOTE — Telephone Encounter (Signed)
I put in a1c, cmp and lipid panel. Recommend he do all three and do lab working fasting for 8 hours. Can he be scheduled by tomorrow.

## 2019-06-27 NOTE — Telephone Encounter (Signed)
Future labs placed. 

## 2019-06-27 NOTE — Telephone Encounter (Signed)
Patient would like to see if an order can be placed for his A1C to be checked. Patient would like to come to the lab tomorrow. Please advise Cb- [phone]

## 2019-06-27 NOTE — Telephone Encounter (Signed)
LMOVM for pt to call and schedule fasting labs.

## 2019-06-27 NOTE — Telephone Encounter (Signed)
Last a1c 11/10/18 Please advise.

## 2019-06-29 ENCOUNTER — Other Ambulatory Visit: Payer: Self-pay

## 2019-06-29 ENCOUNTER — Other Ambulatory Visit: Payer: Self-pay | Admitting: Medical

## 2019-06-29 ENCOUNTER — Other Ambulatory Visit (INDEPENDENT_AMBULATORY_CARE_PROVIDER_SITE_OTHER): Payer: Self-pay

## 2019-06-29 ENCOUNTER — Telehealth: Payer: Self-pay | Admitting: Medical

## 2019-06-29 DIAGNOSIS — E119 Type 2 diabetes mellitus without complications: Secondary | ICD-10-CM

## 2019-06-29 DIAGNOSIS — I10 Essential (primary) hypertension: Secondary | ICD-10-CM

## 2019-06-29 LAB — COMPREHENSIVE METABOLIC PANEL
ALT: 43 U/L (ref 0–53)
AST: 20 U/L (ref 0–37)
Albumin: 4.1 g/dL (ref 3.5–5.2)
Alkaline Phosphatase: 46 U/L (ref 39–117)
BUN: 15 mg/dL (ref 6–23)
CO2: 27 mEq/L (ref 19–32)
Calcium: 9.6 mg/dL (ref 8.4–10.5)
Chloride: 99 mEq/L (ref 96–112)
Creatinine, Ser: 0.97 mg/dL (ref 0.40–1.50)
GFR: 79.27 mL/min (ref >60.00)
Glucose, Bld: 140 mg/dL — ABNORMAL HIGH (ref 70–99)
Potassium: 4.1 mEq/L (ref 3.5–5.1)
Sodium: 136 mEq/L (ref 135–145)
Total Bilirubin: 0.7 mg/dL (ref 0.2–1.2)
Total Protein: 6.6 g/dL (ref 6.0–8.3)

## 2019-06-29 LAB — HEMOGLOBIN A1C: Hgb A1c MFr Bld: 10.3 % — ABNORMAL HIGH (ref 4.6–6.5)

## 2019-06-29 LAB — LIPID PANEL
Cholesterol: 208 mg/dL — ABNORMAL HIGH (ref 0–200)
HDL: 41.6 mg/dL (ref >39.00)
NonHDL: 166.55
Total CHOL/HDL Ratio: 5
Triglycerides: 288 mg/dL — ABNORMAL HIGH (ref 0.0–149.0)
VLDL: 57.6 mg/dL — ABNORMAL HIGH (ref 0.0–40.0)

## 2019-06-29 LAB — LDL CHOLESTEROL, DIRECT: Direct LDL: 138 mg/dL

## 2019-06-29 MED FILL — LOSARTAN POTASSIUM 100 MG T: 100 | 30 days supply | Qty: 30 | Fill #0

## 2019-06-29 MED FILL — ATORVASTATIN 20 MG TABLET: 20 | 30 days supply | Qty: 30 | Fill #0

## 2019-06-29 NOTE — Telephone Encounter (Signed)
Referral to endocrinologist placed. 

## 2019-07-02 ENCOUNTER — Telehealth: Payer: Self-pay | Admitting: Medical

## 2019-07-02 MED ORDER — ATORVASTATIN CALCIUM 20 MG PO TABS: 20.0000 mg | ORAL_TABLET | Freq: Every day | ORAL | 3 refills | Status: DC

## 2019-07-02 NOTE — Telephone Encounter (Signed)
Rx atorvastatin sent to pharmacy.

## 2019-07-26 ENCOUNTER — Ambulatory Visit (INDEPENDENT_AMBULATORY_CARE_PROVIDER_SITE_OTHER): Payer: PRIVATE HEALTH INSURANCE | Admitting: Medical

## 2019-07-26 ENCOUNTER — Encounter: Payer: Self-pay | Admitting: Medical

## 2019-07-26 ENCOUNTER — Other Ambulatory Visit: Payer: Self-pay

## 2019-07-26 VITALS — BP 113/84 | HR 98 | Temp 97.8°F | Resp 16 | Ht 69.0 in | Wt 253.2 lb

## 2019-07-26 DIAGNOSIS — I10 Essential (primary) hypertension: Secondary | ICD-10-CM | POA: Diagnosis not present

## 2019-07-26 DIAGNOSIS — E119 Type 2 diabetes mellitus without complications: Secondary | ICD-10-CM

## 2019-07-26 DIAGNOSIS — E785 Hyperlipidemia, unspecified: Secondary | ICD-10-CM | POA: Diagnosis not present

## 2019-07-26 MED ORDER — LOSARTAN POTASSIUM 100 MG PO TABS: 100.0000 mg | ORAL_TABLET | Freq: Every day | ORAL | 3 refills | Status: DC

## 2019-07-26 MED ORDER — LANTUS SOLOSTAR 100 UNIT/ML ~~LOC~~ SOPN: PEN_INJECTOR | SUBCUTANEOUS | 99 refills | Status: DC

## 2019-07-26 MED ORDER — ATORVASTATIN CALCIUM 20 MG PO TABS: 20.0000 mg | ORAL_TABLET | Freq: Every day | ORAL | 3 refills | Status: DC

## 2019-07-26 MED FILL — ATORVASTATIN 20 MG TABLET: 20 | 30 days supply | Qty: 30 | Fill #0

## 2019-07-26 MED FILL — LOSARTAN POTASSIUM 100 MG T: 100 | 30 days supply | Qty: 30 | Fill #0

## 2019-07-26 NOTE — Progress Notes (Signed)
   Subjective:    Patient ID: Kevin Wiggins, male    DOB: Oct 27, 1960, 58 y.o.   MRN: [id number]  HPI  Pt in for follow up.  Pt has been referred to endocrinologist. Pt has not been taking victoza as he was supposed to. He complains of side effects with metformin. Pt a1c was 10.3 3 weeks ago. Before that was 11.7.   Pt sugar today 161. Pt took metformin today.  Pt surgery recently canceled due to poor diabetes control.  Pt bp good today.  He has hx of high cholesteroil     Review of Systems  Constitutional: Negative for chills, fatigue and fever.  Respiratory: Negative for cough, chest tightness, shortness of breath and wheezing.   Cardiovascular: Negative for chest pain and palpitations.  Gastrointestinal: Negative for abdominal pain.  Genitourinary: Negative for dysuria and frequency.  Musculoskeletal: Negative for back pain.  Skin: Negative for rash.  Neurological: Negative for dizziness, speech difficulty and weakness.  Hematological: Negative for adenopathy. Does not bruise/bleed easily.  Psychiatric/Behavioral: Negative for behavioral problems and confusion.       Objective:   Physical Exam  General Mental Status- Alert. General Appearance- Not in acute distress.    Neck Carotid Arteries- Normal color. Moisture- Normal Moisture. No carotid bruits. No JVD.  Chest and Lung Exam Auscultation: Breath Sounds:-Normal.  Cardiovascular Auscultation:Rythm- Regular. Murmurs & Other Heart Sounds:Auscultation of the heart reveals- No Murmurs.  Abdomen Inspection:-Inspeection Normal. Palpation/Percussion:Note:No mass. Palpation and Percussion of the abdomen reveal- Non Tender, Non Distended + BS, no rebound or guarding.    Neurologic Cranial Nerve exam:- CN III-XII intact(No nystagmus), symmetric smile. Strength:- 5/5 equal and symmetric strength both upper and lower extremities.      Assessment & Plan:  [phone] For better sugar control I am  prescribing lantus insulin. Stop victoza and metformin. Explained titration of insulin. Hopefully sugars will come down and you can eventually have surgery. Still keep appointment with endocrinologist when you get scheduled.  For htn continue current meds.  For cholesterol continue statin.  Follow up  2 weeks virtual phone visit to discuss sugar level and how many units using.  25+ minutes spent with pt. 50% of time spent counseling pt on plan going forward.

## 2019-07-26 NOTE — Patient Instructions (Addendum)
For better sugar control I am prescribing lantus insulin. Stop victoza and metformin. Explained titration of insulin. Hopefully sugars will come down and you can eventually have surgery. Still keep appointment with endocrinologist when you get scheduled. If any sugar below 70 then eat food or drink beverage to get sugar.  For htn continue current meds.  For cholesterol continue statin.  Follow up  2 weeks virtual phone visit to discuss sugar level and how many units using.

## 2019-07-27 MED FILL — LANTUS SOLOSTAR 100 UNITS/M: 100 | 30 days supply | Qty: 3 | Fill #0

## 2019-08-07 ENCOUNTER — Telehealth: Payer: Self-pay | Admitting: Medical

## 2019-08-07 NOTE — Telephone Encounter (Signed)
I am placing  form to send to medical records.

## 2019-08-26 MED FILL — LOSARTAN POTASSIUM 100 MG T: 100 | 30 days supply | Qty: 30 | Fill #1

## 2019-08-26 MED FILL — LANTUS SOLOSTAR 100 UNITS/M: 100 | 30 days supply | Qty: 3 | Fill #1

## 2019-08-26 MED FILL — ATORVASTATIN 20 MG TABLET: 20 | 30 days supply | Qty: 30 | Fill #1

## 2019-09-08 ENCOUNTER — Encounter: Payer: Self-pay | Admitting: Internal Medicine

## 2019-09-08 ENCOUNTER — Other Ambulatory Visit: Payer: Self-pay

## 2019-09-08 ENCOUNTER — Ambulatory Visit (INDEPENDENT_AMBULATORY_CARE_PROVIDER_SITE_OTHER): Payer: PRIVATE HEALTH INSURANCE | Admitting: Internal Medicine

## 2019-09-08 VITALS — BP 130/80 | HR 90 | Ht 69.0 in | Wt 252.0 lb

## 2019-09-08 DIAGNOSIS — E1165 Type 2 diabetes mellitus with hyperglycemia: Secondary | ICD-10-CM

## 2019-09-08 DIAGNOSIS — E1159 Type 2 diabetes mellitus with other circulatory complications: Secondary | ICD-10-CM | POA: Diagnosis not present

## 2019-09-08 LAB — POCT GLYCOSYLATED HEMOGLOBIN (HGB A1C): Hemoglobin A1C: 10.3 % — AB (ref 4.0–5.6)

## 2019-09-08 MED ORDER — METFORMIN HCL ER 500 MG PO TB24: 500.0000 mg | ORAL_TABLET | Freq: Two times a day (BID) | ORAL | 3 refills | Status: DC

## 2019-09-08 MED ORDER — OZEMPIC (0.25 OR 0.5 MG/DOSE) 2 MG/1.5ML ~~LOC~~ SOPN: 0.5000 mg | PEN_INJECTOR | SUBCUTANEOUS | 3 refills | Status: DC

## 2019-09-08 MED ORDER — LANTUS SOLOSTAR 100 UNIT/ML ~~LOC~~ SOPN: PEN_INJECTOR | SUBCUTANEOUS | 3 refills | Status: DC

## 2019-09-08 NOTE — Addendum Note (Signed)
Addended by: Cardell Peach I on: 09/08/2019 09:36 AM   Modules accepted: Orders

## 2019-09-08 NOTE — Progress Notes (Signed)
Patient ID: Kevin Wiggins, male   DOB: 1961/02/22, 58 y.o.   MRN: [id number]   HPI: Kevin Wiggins is a 58 y.o.-year-old male, referred by his PCP, Mackie Pai, PA-C, for management of DM2, dx in ~2014, insulin-dependent in 2019, uncontrolled, withlong-term complications (CHF - resolved after sx.).  Patient has shoulder pain and she had surgery for this.  However, he needs to have a second shoulder surgery.  This was canceled in 05/2019 due to the increased HbA1c. He has been out of work 1 year for the shoulder pb.  Reviewed his HbA1c levels: Lab Results  Component Value Date   HGBA1C 10.3 (H) 06/29/2019   HGBA1C 11.7 (H) 11/10/2018   HGBA1C 11.0 (H) 06/05/2017   HGBA1C 10.0 (H) 03/04/2017   HGBA1C 7.6 (H) 10/01/2016   HGBA1C 8.0 (H) 05/19/2016   HGBA1C 11.2 (H) 11/22/2015   HGBA1C 9.0 (H) 08/20/2015   HGBA1C 7.9 (H) 04/19/2015   HGBA1C 6.6 (H) 07/22/2013   Pt is on a regimen of: - Lantus 10 >> 38 units at bedtime He was previously on Victoza 1.8 mg daily in a.m. >> stopped 2/2 diarrhea. He was on Metformin 1000 mg 2x a day, with meals, but stopped 2/2 diarrhea  Pt checks his sugars 1x a day and they are: - am: 180, 310-330 - 2h after b'fast: n/c - before lunch: n/c - 2h after lunch: n/c - before dinner: n/c - 2h after dinner: n/c - bedtime: n/c - nighttime: n/c Lowest sugar was 180; ? hypoglycemia awareness. Highest sugar was 300s.  Glucometer: One Touch Ultra mini  Pt's meals are: - Breakfast: usually coffee - Lunch: sandwich or soup - Dinner: meat (chicken) + (starch) + veggies - Snacks: chips, fruit No soft drinks. Sugar free packets - in water.  She drinks a monster energy drink -once a day.  - no CKD, last BUN/creatinine:  Lab Results  Component Value Date   BUN 15 06/29/2019   BUN 14 11/10/2018   CREATININE 0.97 06/29/2019   CREATININE 1.05 11/10/2018  On losartan 100 mg daily.  -+ HL; last set of lipids: Lab Results  Component Value Date   CHOL 208  (H) 06/29/2019   HDL 41.60 06/29/2019   LDLCALC 61 11/10/2018   LDLDIRECT 138.0 06/29/2019   TRIG 288.0 (H) 06/29/2019   CHOLHDL 5 06/29/2019  On atorvastatin 20 mg daily.  - last eye exam was in 2018. Reportedly No DR.   - no numbness and tingling in his feet.  Pt has FH of DM in M and F.  He also has a history of hypogonadism and is on testosterone supplementation.  ROS: Constitutional: no weight gain, no weight loss, no fatigue, no subjective hyperthermia, no subjective hypothermia, + nocturia, + frequent urination - drinks a lot of water Eyes: no blurry vision, no xerophthalmia ENT: no sore throat, no nodules palpated in neck, no dysphagia, no odynophagia, no hoarseness, no tinnitus, no hypoacusis Cardiovascular: no CP, no SOB, no palpitations, no leg swelling Respiratory: no cough, no SOB, no wheezing Gastrointestinal: no N, no V, no D, no C, no acid reflux Musculoskeletal: no muscle, + joint aches Skin: no rash, no hair loss Neurological: no tremors, no numbness or tingling/no dizziness/no HAs Psychiatric: no depression, no anxiety  Past Medical History:  Diagnosis Date  . CAD (coronary artery disease) cardiologist-  dr dalton mclean   a. 07/2013: mild nonobstructive CAD (30% RCA)  by cath 07/25/13 as part of eval for VSD/SOB.  . CKD (  chronic kidney disease), stage II   . Diastolic CHF ([place])    a. Acute diastolic CHF 99991111 in the setting of perimembranous VSD. Eval underway given mod pulm HTN and RV dilation.  . ED (erectile dysfunction)   . GERD (gastroesophageal reflux disease)   . Heart murmur   . Hereditary hemochromatosis [facility]) hemotologist/ oncologist-  dr Marin Olp   H63D Homozyous mutation--  treatment phlebotomy (last one 12-03-2016) for HCT < 48%  . Hyperlipidemia   . Hypertension   . LAFB (left anterior fascicular block)   . Mass of soft tissue of right upper extremity   . OSA (obstructive sleep apnea)    severe osa per study 11-17-2013 --  no cpap/  per  pt uses Breath Right strips  . RBBB   . Refusal of blood transfusions as patient is Jehovah's Witness   . 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 Secondary erythrocytosis    due to testosterone therapy  . Secondary testicular hypogonadism    due to hemochromatosis  . Type 2 diabetes mellitus, uncontrolled ([place])    followed by pcp--- last A1c 11.0 on 06-05-2017/  on 07-14-2017 pt stated does not check CBGs, asked pt about A1c being high he stated "doctor told him to watch carbs and did not add medication"   Past Surgical History:  Procedure Laterality Date  . LEFT AND RIGHT HEART CATHETERIZATION WITH CORONARY ANGIOGRAM N/A 07/25/2013   Procedure: LEFT AND RIGHT HEART CATHETERIZATION WITH CORONARY ANGIOGRAM;  Surgeon: Jolaine Artist, MD;  Location: [facility] CATH LAB;  Service: Cardiovascular;  Laterality: N/A; patent coronary arteries w/ non-obstructive CAD (30% RCA); moderate pulmonary HTN;  O2 saturations consistent w/ significant left-right intracardiac shunt  . MASS EXCISION Right 07/17/2017   Procedure: REMOVAL OF RIGHT UPPER ARM SOFT TISSUE MASS;  Surgeon: Jackolyn Confer, MD;  Location: [facility];  Service: General;  Laterality: Right;  . REPAIR SINUS VALSALVA ANEURYSM W/ CPB (STERNOTOMY)  08-26-2013   @[facility]    "patched"  . TEE WITHOUT CARDIOVERSION N/A 07/25/2013   Procedure: TRANSESOPHAGEAL ECHOCARDIOGRAM (TEE);  Surgeon: Dorothy Spark, MD;  [facility] ENDOSCOPY;  Service: Cardiovascular;  Laterality: N/A; moderate dilated RV w/ mild decreased function; a small restrictive perimembranous VSD, peak velocity 4.16m/sec, peak gradient 73mmHg/ trivial AI/  trivial TR/ mild LAE/ no PFO/  mild aortic atherosclerotic plaque  . TRANSTHORACIC ECHOCARDIOGRAM  10-27-2013   dr Aundra Dubin   mild LVH, ef 55-60%/  mild LAE/  sinuses of valsalva appear normal/  PASP  could not be accurately estimated (per dr Aundra Dubin stable s/p sinus of valsalva fistula repair)   Social History   Socioeconomic History  . Marital status: Married    Spouse name: Not on file  . Number of children: 2  . Years of education: Not on file  . Highest education level: Not on file  Occupational History    Employer:  [employer]    Comment: Maintenance   Social Needs  . Financial resource strain: Not on file  . Food insecurity    Worry: Not on file    Inability: Not on file  . Transportation needs    Medical: Not on file    Non-medical: Not on file  Tobacco Use  . Smoking status: Never Smoker  . Smokeless tobacco: Never Used  Substance and Sexual Activity  .  Alcohol use: No    Alcohol/week: 0.0 standard drinks  . Drug use: No   Current Outpatient Medications on File Prior to Visit  Medication Sig Dispense Refill  . atorvastatin (LIPITOR) 20 MG tablet Take 1 tablet (20 mg total) by mouth daily. 30 tablet 3  . HYDROcodone-acetaminophen (NORCO/VICODIN) 5-325 MG tablet hydrocodone 5 mg-acetaminophen 325 mg tablet  TAKE 1 TABLET BY MOUTH EVERY 6 (SIX) HOURS AS NEEDED FOR UP TO 5 DAYS.    . Insulin Glargine (LANTUS SOLOSTAR) 100 UNIT/ML Solostar Pen 10 units injection q hs. Check sugar each morning fasting. Titrate up one unit each day if sugar above 100 fasting in am. If sugar less than 100 decrease unit by one. 5 pen PRN  . losartan (COZAAR) 100 MG tablet Take 1 tablet (100 mg total) by mouth daily. 30 tablet 3  . metFORMIN (GLUCOPHAGE) 500 MG tablet TAKE 2 TABLETS BY MOUTH 2 TIMES A DAY WITH A MEAL 120 tablet 2  . omeprazole (PRILOSEC) 20 MG capsule Take 20 mg by mouth every evening.     . sildenafil (VIAGRA) 100 MG tablet Take 0.5-1 tablets (50-100 mg total) by mouth daily as needed for erectile dysfunction. 5 tablet 3  . testosterone cypionate (DEPOTESTOSTERONE CYPIONATE) 200 MG/ML injection Inject 1 mL (200 mg total) into the muscle every 14 (fourteen) days. As needed  now (Patient taking differently: Inject 200 mg into the muscle every 14 (fourteen) days. As needed now) 10 mL 3  . traMADol (ULTRAM) 50 MG tablet Take 1-2 tablets (50-100 mg total) by mouth every 6 (six) hours as needed. 30 tablet 0  . vardenafil (LEVITRA) 20 MG tablet Take 1 tablet (20 mg total) by mouth daily as needed for erectile dysfunction. (Patient taking differently: Take 20 mg by mouth daily as needed for erectile dysfunction. ) 10 tablet 0  . VICTOZA 18 MG/3ML SOPN INJECT 0.6 MG INJECTION ONCE DAILY FOR 1 WEEK. THEN MAY INCREASE INJECTION TO 1.2 MG DAILY. 9 mL 0   No current facility-administered medications on file prior to visit.    Allergies  Allergen Reactions  . Amlodipine Other (See Comments)    Pt reports dizziness and sleepy.  Pt reports dizziness and sleepy.   . Lisinopril Other (See Comments)    cough cough   Family History  Problem Relation Age of Onset  . Cancer Mother   . Hypertension Father   . Congestive Heart Failure Father   . Colon cancer Paternal Aunt   . Hemochromatosis Unknown        family history   PE: BP 130/80   Pulse 90   Ht 5\' 9"  (1.753 m)   Wt 252 lb (114.3 kg)   SpO2 97%   BMI 37.21 kg/m  Wt Readings from Last 3 Encounters:  09/08/19 252 lb (114.3 kg)  07/26/19 253 lb 3.2 oz (114.9 kg)  05/25/19 253 lb 3.2 oz (114.9 kg)   Constitutional: overweight, in NAD Eyes: PERRLA, EOMI, no exophthalmos ENT: moist mucous membranes, no thyromegaly, no cervical lymphadenopathy Cardiovascular: RRR, No MRG Respiratory: CTA B Gastrointestinal: abdomen soft, NT, ND, BS+ Musculoskeletal: no deformities, strength intact in all 4 Skin: moist, warm, no rashes Neurological: no tremor with outstretched hands, DTR normal in all 4  ASSESSMENT: 1. DM2, insulin-dependent, uncontrolled, with complications - CHF  PLAN:  1. Patient with long-standing, uncontrolled diabetes, on basal insulin only, which became insufficient.  He was previously on Victoza,  which he stopped as he had diarrhea.  Retrospectively, he  thinks the diarrhea was related to Metformin.  He subsequently stop Metformin not only because of diarrhea but also as this was ineffective in controlling his diabetes. -At this visit, we discussed that a regimen containing only Lantus will likely not be enough for him.  We discussed about controlling his postprandial blood sugars and he agrees to restart the GLP-1 receptor agonist.  He was previously on Victoza but at this visit I suggested a once weekly GLP-1 receptor agonist, for example Ozempic.  I will try to send this to the pharmacy.  If this is not covered, we can switch to Trulicity.  His father takes Trulicity and he has very good results with it.  Patient is open to trying this.  He is does not have a personal history of pancreatitis or family history of medullary thyroid cancer.  We will start at a low dose and advance as tolerated. -He also agreed to start the lower dose Metformin ER to see if he can tolerate this better.  I advised him to start with 1 tablet the day and advance to 2 a day. -I also consider an SGLT2 inhibitor, however, he complains of increased urination (he reportedly drinks a lot of fluids)  So for now we will hold off starting this. - I suggested to:  Patient Instructions  Please continue: - Lantus 38 units at bedtime  Please start: - Metformin ER 500 mg 1x a day with dinner and increase to 500 mg 2x a day in 4 days if you tolerate this well - Ozempic 0.25 mg weekly in a.m. (for example on Sunday morning) x 4 weeks, then increase to 0.5 mg weekly in a.m. if no nausea or hypoglycemia.  Please let me know if the sugars are consistently <80 or >200.  Please return in 3 months with your sugar log.   - Strongly advised him to start checking sugars at different times of the day - check 1-2x a day, rotating checks - discussed about CBG targets for treatment: 80-130 mg/dL before meals and <180 mg/dL after meals;  target HbA1c <7%. - given sugar log and advised how to fill it and to bring it at next appt  - given foot care handout and explained the principles  - given instructions for hypoglycemia management "15-15 rule"  - advised for yearly eye exams >> he is due - Return to clinic in 3 mo with sugar log   Philemon Kingdom, MD PhD [facility] Endocrinology

## 2019-09-08 NOTE — Patient Instructions (Addendum)
Please continue: - Lantus 38 units at bedtime  Please start: - Metformin ER 500 mg 1x a day with dinner and increase to 500 mg 2x a day in 4 days if you tolerate this well - Ozempic 0.25 mg weekly in a.m. (for example on Sunday morning) x 4 weeks, then increase to 0.5 mg weekly in a.m. if no nausea or hypoglycemia.  Please let me know if the sugars are consistently <80 or >200.  Please return in 3 months with your sugar log.   PATIENT INSTRUCTIONS FOR TYPE 2 DIABETES:  **Please join MyChart!** - see attached instructions about how to join if you have not done so already.  DIET AND EXERCISE Diet and exercise is an important part of diabetic treatment.  We recommended aerobic exercise in the form of brisk walking (working between 40-60% of maximal aerobic capacity, similar to brisk walking) for 150 minutes per week (such as 30 minutes five days per week) along with 3 times per week performing 'resistance' training (using various gauge rubber tubes with handles) 5-10 exercises involving the major muscle groups (upper body, lower body and core) performing 10-15 repetitions (or near fatigue) each exercise. Start at half the above goal but build slowly to reach the above goals. If limited by weight, joint pain, or disability, we recommend daily walking in a swimming pool with water up to waist to reduce pressure from joints while allow for adequate exercise.    BLOOD GLUCOSES Monitoring your blood glucoses is important for continued management of your diabetes. Please check your blood glucoses 2-4 times a day: fasting, before meals and at bedtime (you can rotate these measurements - e.g. one day check before the 3 meals, the next day check before 2 of the meals and before bedtime, etc.).   HYPOGLYCEMIA (low blood sugar) Hypoglycemia is usually a reaction to not eating, exercising, or taking too much insulin/ other diabetes drugs.  Symptoms include tremors, sweating, hunger, confusion, headache,  etc. Treat IMMEDIATELY with 15 grams of Carbs: . 4 glucose tablets .  cup regular juice/soda . 2 tablespoons raisins . 4 teaspoons sugar . 1 tablespoon honey Recheck blood glucose in 15 mins and repeat above if still symptomatic/blood glucose <100.  RECOMMENDATIONS TO REDUCE YOUR RISK OF DIABETIC COMPLICATIONS: * Take your prescribed MEDICATION(S) * Follow a DIABETIC diet: Complex carbs, fiber rich foods, (monounsaturated and polyunsaturated) fats * AVOID saturated/trans fats, high fat foods, >2,300 mg salt per day. * EXERCISE at least 5 times a week for 30 minutes or preferably daily.  * DO NOT SMOKE OR DRINK more than 1 drink a day. * Check your FEET every day. Do not wear tightfitting shoes. Contact us if you develop an ulcer * See your EYE doctor once a year or more if needed * Get a FLU shot once a year * Get a PNEUMONIA vaccine once before and once after age 62 years  GOALS:  * Your Hemoglobin A1c of <7%  * fasting sugars need to be <130 * after meals sugars need to be <180 (2h after you start eating) * Your Systolic BP should be [id number] or lower  * Your Diastolic BP should be 80 or lower  * Your HDL (Good Cholesterol) should be 40 or higher  * Your LDL (Bad Cholesterol) should be 100 or lower. * Your Triglycerides should be 150 or lower  * Your Urine microalbumin (kidney function) should be <30 * Your Body Mass Index should be 25 or lower  Please consider the following ways to cut down carbs and fat and increase fiber and micronutrients in your diet: - substitute whole grain for white bread or pasta - substitute brown rice for white rice - substitute 90-calorie flat bread pieces for slices of bread when possible - substitute sweet potatoes or yams for white potatoes - substitute humus for margarine - substitute tofu for cheese when possible - substitute almond or rice milk for regular milk (would not drink soy milk daily due to concern for soy estrogen influence on  breast cancer risk) - substitute dark chocolate for other sweets when possible - substitute water - can add lemon or orange slices for taste - for diet sodas (artificial sweeteners will trick your body that you can eat sweets without getting calories and will lead you to overeating and weight gain in the long run) - do not skip breakfast or other meals (this will slow down the metabolism and will result in more weight gain over time)  - can try smoothies made from fruit and almond/rice milk in am instead of regular breakfast - can also try old-fashioned (not instant) oatmeal made with almond/rice milk in am - order the dressing on the side when eating salad at a restaurant (pour less than half of the dressing on the salad) - eat as little meat as possible - can try juicing, but should not forget that juicing will get rid of the fiber, so would alternate with eating raw veg./fruits or drinking smoothies - use as little oil as possible, even when using olive oil - can dress a salad with a mix of balsamic vinegar and lemon juice, for e.g. - use agave nectar, stevia sugar, or regular sugar rather than artificial sweateners - steam or broil/roast veggies  - snack on veggies/fruit/nuts (unsalted, preferably) when possible, rather than processed foods - reduce or eliminate aspartame in diet (it is in diet sodas, chewing gum, etc) Read the labels!  Try to read Dr. Janene Harvey book: "Program for Reversing Diabetes" for other ideas for healthy eating.

## 2019-09-09 MED FILL — OZEMPIC 0.25 OR 0.5 MG/DOSE: 2 | 28 days supply | Qty: 2 | Fill #0

## 2019-09-09 MED FILL — METFORMIN HCL ER 500 MG TB2: 500 | 30 days supply | Qty: 60 | Fill #0

## 2019-09-12 ENCOUNTER — Telehealth: Payer: Self-pay

## 2019-09-12 NOTE — Telephone Encounter (Signed)
We can try [place] U200 same dose

## 2019-09-12 NOTE — Telephone Encounter (Signed)
Lantus is not covered by patient's insurance.  Please advise.

## 2019-09-13 ENCOUNTER — Telehealth: Payer: Self-pay

## 2019-09-13 MED ORDER — TRESIBA FLEXTOUCH 100 UNIT/ML ~~LOC~~ SOPN: 38.0000 [IU] | PEN_INJECTOR | Freq: Every day | SUBCUTANEOUS | 3 refills | Status: DC

## 2019-09-13 NOTE — Telephone Encounter (Signed)
Returned call to patient, he states he does not have insurance and that this is processed through his workers comp.  Patient states Lantus has been covered but when he increased the dose they stopped. Patient would like to continue Lantus but says we will have to call to get the amount he takes approved.  Will also need to send test strip onetouch ultra mini but I will need to call for that as well.

## 2019-09-13 NOTE — Telephone Encounter (Signed)
Tresiba sent

## 2019-09-13 NOTE — Telephone Encounter (Signed)
Kevin Wiggins is not covered and the PA started says to contact [employer] but that is not [employer] card that was scanned in October.  I tried to call the patient to ask about this, someone answered for 8 seconds then hung up.

## 2019-09-13 NOTE — Telephone Encounter (Signed)
Patient returned call. Patient requests to be called at  Ph# [phone].

## 2019-09-13 NOTE — Telephone Encounter (Signed)
Okay to send these, but I would like to make sure he is not running out of insulin.  Does he need samples?

## 2019-09-14 MED ORDER — ONETOUCH ULTRA VI STRP: ORAL_STRIP | 12 refills | Status: AC

## 2019-09-14 MED ORDER — LANTUS SOLOSTAR 100 UNIT/ML ~~LOC~~ SOPN: 38.0000 [IU] | PEN_INJECTOR | Freq: Every day | SUBCUTANEOUS | 4 refills | Status: DC

## 2019-09-28 MED FILL — LANTUS SOLOSTAR 100 UNITS/M: 100 | 24 days supply | Qty: 9 | Fill #0

## 2019-09-28 MED FILL — ATORVASTATIN 20 MG TABLET: 20 | 30 days supply | Qty: 30 | Fill #2

## 2019-09-28 MED FILL — LOSARTAN POTASSIUM 100 MG T: 100 | 30 days supply | Qty: 30 | Fill #2

## 2019-10-12 ENCOUNTER — Telehealth: Payer: Self-pay

## 2019-10-12 NOTE — Telephone Encounter (Signed)
Chart notes from [employer] 09/08/2019 printed and faxed to Sandi Raveling, RN Case Manager with confirmation.

## 2019-10-14 MED FILL — METFORMIN HCL ER 500 MG TAB: 500 | 30 days supply | Qty: 60 | Fill #1

## 2019-10-24 MED FILL — LANTUS SOLOSTAR 100 UNITS/M: 100 | 30 days supply | Qty: 12 | Fill #2

## 2019-11-02 MED FILL — ATORVASTATIN 20 MG TABLET: 20 | 30 days supply | Qty: 30 | Fill #3

## 2019-11-02 MED FILL — LOSARTAN POTASSIUM 100 MG T: 100 | 30 days supply | Qty: 30 | Fill #3

## 2019-11-18 MED FILL — metFORMIN HCL ER 500 MG TB2: 500 | 30 days supply | Qty: 60 | Fill #2

## 2019-11-18 MED FILL — LANTUS SOLOSTAR 100 UNITS/M: 100 | 30 days supply | Qty: 12 | Fill #3

## 2019-12-09 ENCOUNTER — Other Ambulatory Visit: Payer: Self-pay | Admitting: Medical

## 2019-12-09 MED FILL — LOSARTAN POTASSIUM 100 MG T: 100 | 30 days supply | Qty: 30 | Fill #0

## 2019-12-09 MED FILL — ATORVASTATIN 20 MG TABLET: 20 | 30 days supply | Qty: 30 | Fill #0

## 2019-12-09 NOTE — Telephone Encounter (Signed)
Pt came in office stating needing refill ASAP. Please advise.

## 2019-12-13 MED FILL — LANTUS SOLOSTAR 100 UNITS/M: 100 | 30 days supply | Qty: 12 | Fill #4

## 2019-12-14 ENCOUNTER — Other Ambulatory Visit: Payer: Self-pay

## 2019-12-14 ENCOUNTER — Encounter: Payer: Self-pay | Admitting: Internal Medicine

## 2019-12-14 ENCOUNTER — Ambulatory Visit (INDEPENDENT_AMBULATORY_CARE_PROVIDER_SITE_OTHER): Payer: PRIVATE HEALTH INSURANCE | Admitting: Internal Medicine

## 2019-12-14 VITALS — BP 128/98 | HR 97 | Ht 69.0 in | Wt 264.0 lb

## 2019-12-14 DIAGNOSIS — E1159 Type 2 diabetes mellitus with other circulatory complications: Secondary | ICD-10-CM

## 2019-12-14 DIAGNOSIS — E669 Obesity, unspecified: Secondary | ICD-10-CM | POA: Diagnosis not present

## 2019-12-14 DIAGNOSIS — E782 Mixed hyperlipidemia: Secondary | ICD-10-CM

## 2019-12-14 DIAGNOSIS — E1165 Type 2 diabetes mellitus with hyperglycemia: Secondary | ICD-10-CM | POA: Diagnosis not present

## 2019-12-14 LAB — POCT GLYCOSYLATED HEMOGLOBIN (HGB A1C): Hemoglobin A1C: 8.7 % — AB (ref 4.0–5.6)

## 2019-12-14 MED ORDER — FARXIGA 5 MG PO TABS: 5.0000 mg | ORAL_TABLET | Freq: Every day | ORAL | 11 refills | Status: DC

## 2019-12-14 NOTE — Patient Instructions (Addendum)
Please continue: - Metformin ER 500 mg 2x a day - Lantus 50 units at bedtime  Please add: - Farxiga 5 mg daily before b'fast  Please let me know if we need to increase the dose to 10 mg daily before the prescription runs out.  Please return in 3 months with your sugar log.

## 2019-12-14 NOTE — Progress Notes (Signed)
Patient ID: Kevin Wiggins, male   DOB: Oct 25, 1960, 59 y.o.   MRN: [id number]   This visit occurred during the SARS-CoV-2 public health emergency.  Safety protocols were in place, including screening questions prior to the visit, additional usage of staff PPE, and extensive cleaning of exam room while observing appropriate contact time as indicated for disinfecting solutions.   HPI: Kevin Wiggins is a 59 y.o.-year-old male, referred by his PCP, Mackie Pai, PA-C, for management of DM2, dx in ~2014, insulin-dependent in 2019, uncontrolled, withlong-term complications (CHF - resolved after sx.).  Last visit 3 months ago.  Patient has shoulder pain and he had surgery for this.  However, he needs to have a second shoulder surgery and this was canceled in 05/2019 due to the high HbA1c.  He has been out of work due to shoulder pain.  Reviewed HbA1c levels: Lab Results  Component Value Date   HGBA1C 10.3 (A) 09/08/2019   HGBA1C 10.3 (H) 06/29/2019   HGBA1C 11.7 (H) 11/10/2018   HGBA1C 11.0 (H) 06/05/2017   HGBA1C 10.0 (H) 03/04/2017   HGBA1C 7.6 (H) 10/01/2016   HGBA1C 8.0 (H) 05/19/2016   HGBA1C 11.2 (H) 11/22/2015   HGBA1C 9.0 (H) 08/20/2015   HGBA1C 7.9 (H) 04/19/2015   HGBA1C 6.6 (H) 07/22/2013   At last visit he was on: - Lantus 10 >> 38 units at bedtime He was previously on Victoza 1.8 mg daily in a.m. >> stopped 2/2 diarrhea. He was on Metformin 1000 mg 2x a day, with meals, but stopped 2/2 diarrhea  We changed to: - Metformin ER 500 mg 2x a day: L and D - Lantus 38 >> 50 units at bedtime He could not tolerate Ozempic (mistakenly started at 0.5 mg weekly): eructations, nausea, diarrhea.  Pt checks his sugars once a day: - am: 180, 310-330 >> 136-158, 178 - 2h after b'fast: n/c - before lunch: n/c - 2h after lunch: n/c - before dinner: n/c - 2h after dinner: n/c - bedtime: n/c - nighttime: n/c Lowest sugar was 180 >> 136; it is unclear at which level he has hypoglycemia  awareness. Highest sugar was 300s >> 178.  Glucometer: One Touch Ultra mini  Pt's meals are: - Breakfast: usually coffee - Lunch: sandwich or soup - Dinner: meat (chicken) + (starch) + veggies - Snacks: chips, fruit He uses Sugar free packets - in water.  He occasionally drinks a Monster energy drink.  At last visit, he was drinking these daily.  -No CKD, last BUN/creatinine:  Lab Results  Component Value Date   BUN 15 06/29/2019   BUN 14 11/10/2018   CREATININE 0.97 06/29/2019   CREATININE 1.05 11/10/2018  On losartan 100.  -+ HL; last set of lipids: Lab Results  Component Value Date   CHOL 208 (H) 06/29/2019   HDL 41.60 06/29/2019   LDLCALC 61 11/10/2018   LDLDIRECT 138.0 06/29/2019   TRIG 288.0 (H) 06/29/2019   CHOLHDL 5 06/29/2019  On Lipitor 20.  - last eye exam was in 2018: Reportedly no DR  - no numbness and tingling in his feet.  Pt has FH of DM in M and F.  He also has a history of hypogonadism and is on testosterone supplementation. He has hemochromatosis - sees Dr. Marin Olp. No recet need for phlebotomies.  ROS: Constitutional: + weight gain/no weight loss, no fatigue, no subjective hyperthermia, no subjective hypothermia, he has increased urination improved after blood sugars improved and reducing energy drinks Eyes: no blurry vision,  no xerophthalmia ENT: no sore throat, no nodules palpated in neck, no dysphagia, no odynophagia, no hoarseness Cardiovascular: no CP/no SOB/no palpitations/no leg swelling Respiratory: no cough/no SOB/no wheezing Gastrointestinal: no N/no V/no D/no C/no acid reflux Musculoskeletal: no muscle aches/+ joint aches Skin: no rashes, no hair loss Neurological: no tremors/no numbness/no tingling/no dizziness  I reviewed pt's medications, allergies, PMH, social hx, family hx, and changes were documented in the history of present illness. Otherwise, unchanged from my initial visit note.  Past Medical History:  Diagnosis Date  .  CAD (coronary artery disease) cardiologist-  dr dalton mclean   a. 07/2013: mild nonobstructive CAD (30% RCA)  by cath 07/25/13 as part of eval for VSD/SOB.  . CKD (chronic kidney disease), stage II   . Diastolic CHF ([place])    a. Acute diastolic CHF 99991111 in the setting of perimembranous VSD. Eval underway given mod pulm HTN and RV dilation.  . ED (erectile dysfunction)   . GERD (gastroesophageal reflux disease)   . Heart murmur   . Hereditary hemochromatosis [facility]) hemotologist/ oncologist-  dr Marin Olp   H63D Homozyous mutation--  treatment phlebotomy (last one 12-03-2016) for HCT < 48%  . Hyperlipidemia   . Hypertension   . LAFB (left anterior fascicular block)   . Mass of soft tissue of right upper extremity   . OSA (obstructive sleep apnea)    severe osa per study 11-17-2013 --  no cpap/  per pt uses Breath Right strips  . RBBB   . Refusal of blood transfusions as patient is Jehovah's Witness   . 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 Secondary erythrocytosis    due to testosterone therapy  . Secondary testicular hypogonadism    due to hemochromatosis  . Type 2 diabetes mellitus, uncontrolled ([place])    followed by pcp--- last A1c 11.0 on 06-05-2017/  on 07-14-2017 pt stated does not check CBGs, asked pt about A1c being high he stated "doctor told him to watch carbs and did not add medication"   Past Surgical History:  Procedure Laterality Date  . LEFT AND RIGHT HEART CATHETERIZATION WITH CORONARY ANGIOGRAM N/A 07/25/2013   Procedure: LEFT AND RIGHT HEART CATHETERIZATION WITH CORONARY ANGIOGRAM;  Surgeon: Jolaine Artist, MD;  Location: [facility] CATH LAB;  Service: Cardiovascular;  Laterality: N/A; patent coronary arteries w/ non-obstructive CAD (30% RCA); moderate pulmonary HTN;  O2 saturations consistent w/ significant left-right intracardiac shunt  . MASS EXCISION  Right 07/17/2017   Procedure: REMOVAL OF RIGHT UPPER ARM SOFT TISSUE MASS;  Surgeon: Jackolyn Confer, MD;  Location: [place];  Service: General;  Laterality: Right;  . REPAIR SINUS VALSALVA ANEURYSM W/ CPB (STERNOTOMY)  08-26-2013   @[facility]    "patched"  . TEE WITHOUT CARDIOVERSION N/A 07/25/2013   Procedure: TRANSESOPHAGEAL ECHOCARDIOGRAM (TEE);  Surgeon: Dorothy Spark, MD;  Location: [facility] ENDOSCOPY;  Service: Cardiovascular;  Laterality: N/A; moderate dilated RV w/ mild decreased function; a small restrictive perimembranous VSD, peak velocity 4.12m/sec, peak gradient 17mmHg/ trivial AI/  trivial TR/ mild LAE/ no PFO/  mild aortic atherosclerotic plaque  . TRANSTHORACIC ECHOCARDIOGRAM  10-27-2013   dr Aundra Dubin   mild LVH, ef 55-60%/  mild LAE/  sinuses of valsalva appear normal/  PASP could not be accurately estimated (per dr Aundra Dubin stable s/p sinus of valsalva fistula repair)   Social History  Socioeconomic History  . Marital status: Married    Spouse name: Not on file  . Number of children: 2  . Years of education: Not on file  . Highest education level: Not on file  Occupational History    Employer:  [employer]    Comment: Maintenance   Social Needs  . Financial resource strain: Not on file  . Food insecurity    Worry: Not on file    Inability: Not on file  . Transportation needs    Medical: Not on file    Non-medical: Not on file  Tobacco Use  . Smoking status: Never Smoker  . Smokeless tobacco: Never Used  Substance and Sexual Activity  . Alcohol use: No    Alcohol/week: 0.0 standard drinks  . Drug use: No   Current Outpatient Medications on File Prior to Visit  Medication Sig Dispense Refill  . atorvastatin (LIPITOR) 20 MG tablet Take 1 tablet (20 mg total) by mouth daily. 30 tablet 3  . glucose blood (ONETOUCH ULTRA) test strip Use to check blood sugar 2 times a day. 100 each 12  . HYDROcodone-acetaminophen (NORCO/VICODIN) 5-325 MG tablet hydrocodone  5 mg-acetaminophen 325 mg tablet  TAKE 1 TABLET BY MOUTH EVERY 6 (SIX) HOURS AS NEEDED FOR UP TO 5 DAYS.    . Insulin Glargine (LANTUS SOLOSTAR) 100 UNIT/ML Solostar Pen Inject 38 Units into the skin daily. 5 pen 4  . losartan (COZAAR) 100 MG tablet TAKE 1 TABLET (100 MG TOTAL) BY MOUTH DAILY. 30 tablet 3  . metFORMIN (GLUCOPHAGE-XR) 500 MG 24 hr tablet Take 1 tablet (500 mg total) by mouth 2 (two) times daily with a meal. 180 tablet 3  . omeprazole (PRILOSEC) 20 MG capsule Take 20 mg by mouth every evening.     . Semaglutide,0.25 or 0.5MG /DOS, (OZEMPIC, 0.25 OR 0.5 MG/DOSE,) 2 MG/1.5ML SOPN Inject 0.5 mg into the skin once a week. 2 pen 3  . sildenafil (VIAGRA) 100 MG tablet Take 0.5-1 tablets (50-100 mg total) by mouth daily as needed for erectile dysfunction. 5 tablet 3  . testosterone cypionate (DEPOTESTOSTERONE CYPIONATE) 200 MG/ML injection Inject 1 mL (200 mg total) into the muscle every 14 (fourteen) days. As needed now (Patient taking differently: Inject 200 mg into the muscle every 14 (fourteen) days. As needed now) 10 mL 3  . traMADol (ULTRAM) 50 MG tablet Take 1-2 tablets (50-100 mg total) by mouth every 6 (six) hours as needed. 30 tablet 0  . vardenafil (LEVITRA) 20 MG tablet Take 1 tablet (20 mg total) by mouth daily as needed for erectile dysfunction. (Patient taking differently: Take 20 mg by mouth daily as needed for erectile dysfunction. ) 10 tablet 0   No current facility-administered medications on file prior to visit.   Allergies  Allergen Reactions  . Amlodipine Other (See Comments)    Pt reports dizziness and sleepy.  Pt reports dizziness and sleepy.   . Lisinopril Other (See Comments)    cough cough   Family History  Problem Relation Age of Onset  . Cancer Mother   . Hypertension Father   . Congestive Heart Failure Father   . Colon cancer Paternal Aunt   . Hemochromatosis Unknown        family history   PE: BP (!) 128/98   Pulse 97   Ht 5\' 9"  (1.753 m)   Wt  264 lb (119.7 kg)   SpO2 98%   BMI 38.99 kg/m  Wt Readings from Last 3 Encounters:  12/14/19 264 lb (119.7 kg)  09/08/19 252 lb (114.3 kg)  07/26/19 253 lb 3.2 oz (114.9 kg)   Constitutional: overweight, in NAD Eyes: PERRLA, EOMI, no exophthalmos ENT: moist mucous membranes, no thyromegaly, no cervical lymphadenopathy Cardiovascular: tachycardia, RR, No MRG Respiratory: CTA B Gastrointestinal: abdomen soft, NT, ND, BS+ Musculoskeletal: no deformities, strength intact in all 4 Skin: moist, warm, no rashes Neurological: no tremor with outstretched hands, DTR normal in all 4  ASSESSMENT: 1. DM2, insulin-dependent, uncontrolled, with complications - CHF  No personal history of pancreatitis or family history of medullary thyroid cancer.  2. HL  3. Obesity class 2  PLAN:  1. Patient with longstanding, uncontrolled, type 2 diabetes, previously on basal insulin only with a high HbA1c, at 10.3% at our last visit as above.  At that time, we added a weekly GLP-1 receptor agonist and also Metformin ER. -He was previously on Victoza, which he stopped due to diarrhea, but retrospectively, this could have been related to Metformin IR. -At last visit, we considered clinic she has been eating better but he was complaining of increased urination so we did not start.  At this visit, he mentions that his increased urination resolved.  He did reduce energy drinks since last visit, only drinking occasionally -At this visit, his sugars are much better, 50% lower than last visit.  However, he is only checking in the morning and we discussed about the importance of checking later in the day, also. -He could not tolerate Ozempic, but upon questioning, he started at the higher dose, 0.5 mg weekly, rather than the recommended 0.25 mg weekly.  For now, however, will continue without Ozempic but he is open to trying Trulicity later on, if needed.  For now, since he is increased urination resolved, I suggested an  SGLT 2 inhibitor at a low dose.  He agrees to start [place].  Discussed about benefits of side effects.  Advised him to stay hydrated.  If he does well on the low-dose Farxiga, we will increase the dose after a month. - I suggested to:  Patient Instructions  Please continue: - Metformin ER 500 mg 2x a day - Lantus 50 units at bedtime  Please add: - Farxiga 5 mg daily before b'fast  Please let me know if we need to increase the dose to 10 mg daily before the prescription runs out.  Please return in 3 months with your sugar log.   - we checked his HbA1c: 8.7% (lower, but still above target) - advised to check sugars at different times of the day - 1x a day, rotating check times - advised for yearly eye exams >> he is not UTD - return to clinic in 3 months  2. HL - Reviewed latest lipid panel from 06/2019: LDL higher than goal, increased, triglycerides high Lab Results  Component Value Date   CHOL 208 (H) 06/29/2019   HDL 41.60 06/29/2019   LDLCALC 61 11/10/2018   LDLDIRECT 138.0 06/29/2019   TRIG 288.0 (H) 06/29/2019   CHOLHDL 5 06/29/2019  - Continues the statin without side effects.  3.  Obesity class II -Unfortunately, he could not tolerate Ozempic which would have helped with weight loss.  At this visit, we will check to start an SGLT 2 inhibitor, which should also help with weight loss -gained 12 lbs since last OV, possibly also due to increasing Lantus dose  Philemon Kingdom, MD PhD [facility]

## 2019-12-30 MED FILL — metFORMIN HCL ER 500 MG TB2: 500 | 30 days supply | Qty: 60 | Fill #3

## 2020-01-10 MED FILL — ATORVASTATIN 20 MG TABLET: 20 | 30 days supply | Qty: 30 | Fill #0

## 2020-01-10 MED FILL — LANTUS SOLOSTAR 100 UNITS/M: 100 | 30 days supply | Qty: 12 | Fill #5

## 2020-01-10 MED FILL — LOSARTAN POTASSIUM 100 MG T: 100 | 30 days supply | Qty: 30 | Fill #1

## 2020-02-06 MED FILL — LOSARTAN POTASSIUM 100 MG T: 100 | 30 days supply | Qty: 30 | Fill #2

## 2020-02-06 MED FILL — ATORVASTATIN 20 MG TABLET: 20 | 30 days supply | Qty: 30 | Fill #1

## 2020-02-06 MED FILL — metFORMIN HCL ER 500 MG TB2: 500 | 30 days supply | Qty: 60 | Fill #4

## 2020-02-07 MED FILL — LANTUS SOLOSTAR 100 UNITS/M: 100 | 30 days supply | Qty: 12 | Fill #6

## 2020-02-20 ENCOUNTER — Other Ambulatory Visit: Payer: Self-pay

## 2020-02-22 ENCOUNTER — Ambulatory Visit (INDEPENDENT_AMBULATORY_CARE_PROVIDER_SITE_OTHER): Payer: PRIVATE HEALTH INSURANCE | Admitting: Internal Medicine

## 2020-02-22 ENCOUNTER — Encounter: Payer: Self-pay | Admitting: Internal Medicine

## 2020-02-22 ENCOUNTER — Other Ambulatory Visit: Payer: Self-pay

## 2020-02-22 VITALS — BP 146/98 | HR 83 | Ht 69.0 in | Wt 267.0 lb

## 2020-02-22 DIAGNOSIS — E1159 Type 2 diabetes mellitus with other circulatory complications: Secondary | ICD-10-CM | POA: Diagnosis not present

## 2020-02-22 DIAGNOSIS — E1165 Type 2 diabetes mellitus with hyperglycemia: Secondary | ICD-10-CM | POA: Diagnosis not present

## 2020-02-22 DIAGNOSIS — E669 Obesity, unspecified: Secondary | ICD-10-CM | POA: Diagnosis not present

## 2020-02-22 DIAGNOSIS — E782 Mixed hyperlipidemia: Secondary | ICD-10-CM

## 2020-02-22 LAB — POCT GLYCOSYLATED HEMOGLOBIN (HGB A1C): Hemoglobin A1C: 8.8 % — AB (ref 4.0–5.6)

## 2020-02-22 MED ORDER — TRULICITY 0.75 MG/0.5ML ~~LOC~~ SOAJ: SUBCUTANEOUS | 1 refills | Status: DC

## 2020-02-22 MED ORDER — LANTUS SOLOSTAR 100 UNIT/ML ~~LOC~~ SOPN: 50.0000 [IU] | PEN_INJECTOR | Freq: Every day | SUBCUTANEOUS | 3 refills | Status: DC

## 2020-02-22 NOTE — Patient Instructions (Addendum)
Please continue: - Metformin ER 500 mg 2x a day - Lantus 50 units at bedtime  Please start Trulicity [id number] mg weekly. Let me know when you are close to running out to call in the higher dose to your pharmacy (1.5 mg).  Please consider the following ways to cut down carbs and fat and increase fiber and micronutrients in your diet: - substitute whole grain for white bread or pasta - substitute brown rice for white rice - substitute 90-calorie flat bread pieces for slices of bread when possible - substitute sweet potatoes or yams for white potatoes - substitute humus for margarine - substitute tofu for cheese when possible - substitute almond or rice milk for regular milk (would not drink soy milk daily due to concern for soy estrogen influence on breast cancer risk) - substitute dark chocolate for other sweets when possible - substitute water - can add lemon or orange slices for taste - for diet sodas (artificial sweeteners will trick your body that you can eat sweets without getting calories and will lead you to overeating and weight gain in the long run) - do not skip breakfast or other meals (this will slow down the metabolism and will result in more weight gain over time)  - can try smoothies made from fruit and almond/rice milk in am instead of regular breakfast - can also try old-fashioned (not instant) oatmeal made with almond/rice milk in am - order the dressing on the side when eating salad at a restaurant (pour less than half of the dressing on the salad) - eat as little meat as possible - can try juicing, but should not forget that juicing will get rid of the fiber, so would alternate with eating raw veg./fruits or drinking smoothies - use as little oil as possible, even when using olive oil - can dress a salad with a mix of balsamic vinegar and lemon juice, for e.g. - use agave nectar, stevia sugar, or regular sugar rather than artificial sweateners - steam or broil/roast veggies  -  snack on veggies/fruit/nuts (unsalted, preferably) when possible, rather than processed foods - reduce or eliminate aspartame in diet (it is in diet sodas, chewing gum, etc) Read the labels!  Try to read Dr. Janene Harvey book: "Program for Reversing Diabetes" for other ideas for healthy eating.  Please return in 1.5 months with your sugar log.

## 2020-02-22 NOTE — Addendum Note (Signed)
Addended by: Cardell Peach I on: 02/22/2020 10:19 AM   Modules accepted: Orders

## 2020-02-22 NOTE — Progress Notes (Signed)
Patient ID: [name], male   DOB: 12/24/1960, 59 y.o.   MRN: [id number]   This visit occurred during the SARS-CoV-2 public health emergency.  Safety protocols were in place, including screening questions prior to the visit, additional usage of staff PPE, and extensive cleaning of exam room while observing appropriate contact time as indicated for disinfecting solutions.   HPI: Kevin Wiggins is a 59 y.o.-year-old male, initially referred by his PCP, Kevin Pai, PA-C, returning for follow-up for DM2, dx in ~2014, insulin-dependent in 2019, uncontrolled, withlong-term complications (CHF - resolved after sx.).  Last visit 2.5 months ago.  Patient has shoulder pain and he had surgery for this.  However, he needs to have a second shoulder surgery and this was canceled in 05/2019 due to the high HbA1c.  He has been out of work due to shoulder pain.  Reviewed HbA1c levels: Lab Results  Component Value Date   HGBA1C 8.7 (A) 12/14/2019   HGBA1C 10.3 (A) 09/08/2019   HGBA1C 10.3 (H) 06/29/2019   HGBA1C 11.7 (H) 11/10/2018   HGBA1C 11.0 (H) 06/05/2017   HGBA1C 10.0 (H) 03/04/2017   HGBA1C 7.6 (H) 10/01/2016   HGBA1C 8.0 (H) 05/19/2016   HGBA1C 11.2 (H) 11/22/2015   HGBA1C 9.0 (H) 08/20/2015   HGBA1C 7.9 (H) 04/19/2015   HGBA1C 6.6 (H) 07/22/2013   Previously on: - Lantus 10 >> 38 units at bedtime He was previously on Victoza 1.8 mg daily in a.m. >> stopped 2/2 diarrhea. He was on Metformin 1000 mg 2x a day, with meals, but stopped 2/2 diarrhea  Currently on: - Metformin ER 500 mg 2x a day: L and D - Lantus 38 >> 50 units at bedtime We tried to start [place] 5 mg before breakfast 12/2019- did not start - not covered He could not tolerate Ozempic (mistakenly started at 0.5 mg weekly): eructations, nausea, diarrhea.  Pt checks his sugars once a day: - am: 180, 310-330 >> 136-158, 178 >> 124, 154-193, 210 - 2h after b'fast: n/c - before lunch: n/c - 2h after lunch: n/c - before  dinner: n/c - 2h after dinner: n/c - bedtime: n/c - nighttime: n/c Lowest sugar was 180 >> 136; it is unclear at which level he has hypoglycemia awareness. Highest sugar was 300s >> 178.  Glucometer: One Touch Ultra mini  Pt's meals are: - Breakfast: usually coffee - Lunch: sandwich or soup - Dinner: meat (chicken) + (starch) + veggies - Snacks: chips, fruit He uses Sugar free packets - in water.  He occasionally drinks a Monster energy drink.  At last visit, he was drinking these daily.  -No CKD, last BUN/creatinine:  Lab Results  Component Value Date   BUN 15 06/29/2019   BUN 14 11/10/2018   CREATININE 0.97 06/29/2019   CREATININE 1.05 11/10/2018  On losartan 100.  -+ HL; last set of lipids: Lab Results  Component Value Date   CHOL 208 (H) 06/29/2019   HDL 41.60 06/29/2019   LDLCALC 61 11/10/2018   LDLDIRECT 138.0 06/29/2019   TRIG 288.0 (H) 06/29/2019   CHOLHDL 5 06/29/2019  On Lipitor 20.  - last eye exam was in 2018: Reportedly no DR  -He denies numbness and tingling in his feet.  Pt has FH of DM in M and F.  He also has a history of hypogonadism and is on testosterone supplementation. He has hemochromatosis - sees Dr. Marin Olp. No recet need for phlebotomies.  ROS: Constitutional: + weight gain/no weight loss, no fatigue,  no subjective hyperthermia, no subjective hypothermia, he has increased urination improved after blood sugars improved and reducing energy drinks Eyes: no blurry vision, no xerophthalmia ENT: no sore throat, no nodules palpated in neck, no dysphagia, no odynophagia, no hoarseness Cardiovascular: no CP/no SOB/no palpitations/no leg swelling Respiratory: no cough/no SOB/no wheezing Gastrointestinal: no N/no V/no D/no C/no acid reflux Musculoskeletal: no muscle aches/+ joint aches (shoulder) Skin: no rashes, no hair loss Neurological: no tremors/no numbness/no tingling/no dizziness  I reviewed pt's medications, allergies, PMH, social hx,  family hx, and changes were documented in the history of present illness. Otherwise, unchanged from my initial visit note.  Past Medical History:  Diagnosis Date  . CAD (coronary artery disease) cardiologist-  dr dalton mclean   a. 07/2013: mild nonobstructive CAD (30% RCA)  by cath 07/25/13 as part of eval for VSD/SOB.  . CKD (chronic kidney disease), stage II   . Diastolic CHF ([place])    a. Acute diastolic CHF 99991111 in the setting of perimembranous VSD. Eval underway given mod pulm HTN and RV dilation.  . ED (erectile dysfunction)   . GERD (gastroesophageal reflux disease)   . Heart murmur   . Hereditary hemochromatosis [facility]) hemotologist/ oncologist-  dr Marin Olp   H63D Homozyous mutation--  treatment phlebotomy (last one 12-03-2016) for HCT < 48%  . Hyperlipidemia   . Hypertension   . LAFB (left anterior fascicular block)   . Mass of soft tissue of right upper extremity   . OSA (obstructive sleep apnea)    severe osa per study 11-17-2013 --  no cpap/  per pt uses Breath Right strips  . RBBB   . Refusal of blood transfusions as patient is Jehovah's Witness   . 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 Secondary erythrocytosis    due to testosterone therapy  . Secondary testicular hypogonadism    due to hemochromatosis  . Type 2 diabetes mellitus, uncontrolled ([place])    followed by pcp--- last A1c 11.0 on 06-05-2017/  on 07-14-2017 pt stated does not check CBGs, asked pt about A1c being high he stated "doctor told him to watch carbs and did not add medication"   Past Surgical History:  Procedure Laterality Date  . LEFT AND RIGHT HEART CATHETERIZATION WITH CORONARY ANGIOGRAM N/A 07/25/2013   Procedure: LEFT AND RIGHT HEART CATHETERIZATION WITH CORONARY ANGIOGRAM;  Surgeon: Jolaine Artist, MD;  Location: [facility] CATH LAB;  Service: Cardiovascular;  Laterality: N/A;  patent coronary arteries w/ non-obstructive CAD (30% RCA); moderate pulmonary HTN;  O2 saturations consistent w/ significant left-right intracardiac shunt  . MASS EXCISION Right 07/17/2017   Procedure: REMOVAL OF RIGHT UPPER ARM SOFT TISSUE MASS;  Surgeon: Jackolyn Confer, MD;  Location: [place];  Service: General;  Laterality: Right;  . REPAIR SINUS VALSALVA ANEURYSM W/ CPB (STERNOTOMY)  08-26-2013   @[facility]    "patched"  . TEE WITHOUT CARDIOVERSION N/A 07/25/2013   Procedure: TRANSESOPHAGEAL ECHOCARDIOGRAM (TEE);  Surgeon: Dorothy Spark, MD;  Location: [facility] ENDOSCOPY;  Service: Cardiovascular;  Laterality: N/A; moderate dilated RV w/ mild decreased function; a small restrictive perimembranous VSD, peak velocity 4.67m/sec, peak gradient 46mmHg/ trivial AI/  trivial TR/ mild LAE/ no PFO/  mild aortic atherosclerotic plaque  . TRANSTHORACIC ECHOCARDIOGRAM  10-27-2013   dr Aundra Dubin   mild LVH, ef 55-60%/  mild LAE/  sinuses of valsalva  appear normal/  PASP could not be accurately estimated (per dr Aundra Dubin stable s/p sinus of valsalva fistula repair)   Social History   Socioeconomic History  . Marital status: Married    Spouse name: Not on file  . Number of children: 2  . Years of education: Not on file  . Highest education level: Not on file  Occupational History    Employer:  [employer]    Comment: Maintenance   Social Needs  . Financial resource strain: Not on file  . Food insecurity    Worry: Not on file    Inability: Not on file  . Transportation needs    Medical: Not on file    Non-medical: Not on file  Tobacco Use  . Smoking status: Never Smoker  . Smokeless tobacco: Never Used  Substance and Sexual Activity  . Alcohol use: No    Alcohol/week: 0.0 standard drinks  . Drug use: No   Current Outpatient Medications on File Prior to Visit  Medication Sig Dispense Refill  . atorvastatin (LIPITOR) 20 MG tablet Take 1 tablet (20 mg total) by mouth daily. 30 tablet  3  . dapagliflozin propanediol (FARXIGA) 5 MG TABS tablet Take 5 mg by mouth daily. 30 tablet 11  . glucose blood (ONETOUCH ULTRA) test strip Use to check blood sugar 2 times a day. 100 each 12  . HYDROcodone-acetaminophen (NORCO/VICODIN) 5-325 MG tablet hydrocodone 5 mg-acetaminophen 325 mg tablet  TAKE 1 TABLET BY MOUTH EVERY 6 (SIX) HOURS AS NEEDED FOR UP TO 5 DAYS.    . Insulin Glargine (LANTUS SOLOSTAR) 100 UNIT/ML Solostar Pen Inject 38 Units into the skin daily. 5 pen 4  . losartan (COZAAR) 100 MG tablet TAKE 1 TABLET (100 MG TOTAL) BY MOUTH DAILY. 30 tablet 3  . metFORMIN (GLUCOPHAGE-XR) 500 MG 24 hr tablet Take 1 tablet (500 mg total) by mouth 2 (two) times daily with a meal. 180 tablet 3  . omeprazole (PRILOSEC) 20 MG capsule Take 20 mg by mouth every evening.     . sildenafil (VIAGRA) 100 MG tablet Take 0.5-1 tablets (50-100 mg total) by mouth daily as needed for erectile dysfunction. 5 tablet 3  . testosterone cypionate (DEPOTESTOSTERONE CYPIONATE) 200 MG/ML injection Inject 1 mL (200 mg total) into the muscle every 14 (fourteen) days. As needed now (Patient taking differently: Inject 200 mg into the muscle every 14 (fourteen) days. As needed now) 10 mL 3  . traMADol (ULTRAM) 50 MG tablet Take 1-2 tablets (50-100 mg total) by mouth every 6 (six) hours as needed. 30 tablet 0  . vardenafil (LEVITRA) 20 MG tablet Take 1 tablet (20 mg total) by mouth daily as needed for erectile dysfunction. (Patient taking differently: Take 20 mg by mouth daily as needed for erectile dysfunction. ) 10 tablet 0   No current facility-administered medications on file prior to visit.   Allergies  Allergen Reactions  . Amlodipine Other (See Comments)    Pt reports dizziness and sleepy.  Pt reports dizziness and sleepy.   . Lisinopril Other (See Comments)    cough cough  . Ozempic (0.25 Or 0.5 Mg-Dose) [Semaglutide(0.25 Or 0.5mg -Dos)] Nausea Only   Family History  Problem Relation Age of Onset  .  Cancer Mother   . Hypertension Father   . Congestive Heart Failure Father   . Colon cancer Paternal Aunt   . Hemochromatosis Unknown        family history   PE: BP (!) 146/98   Pulse 83  Ht 5\' 9"  (1.753 m)   Wt 267 lb (121.1 kg)   SpO2 97%   BMI 39.43 kg/m  Wt Readings from Last 3 Encounters:  02/22/20 267 lb (121.1 kg)  12/14/19 264 lb (119.7 kg)  09/08/19 252 lb (114.3 kg)   Constitutional: overweight, in NAD Eyes: PERRLA, EOMI, no exophthalmos ENT: moist mucous membranes, no thyromegaly, no cervical lymphadenopathy Cardiovascular: RRR, No MRG Respiratory: CTA B Gastrointestinal: abdomen soft, NT, ND, BS+ Musculoskeletal: no deformities, strength intact in all 4 Skin: moist, warm, no rashes Neurological: no tremor with outstretched hands, DTR normal in all 4  ASSESSMENT: 1. DM2, insulin-dependent, uncontrolled, with complications - CHF  No personal history of pancreatitis or family history of medullary thyroid cancer.  2. HL  3. Obesity class 2  PLAN:  1. Patient with longstanding, uncontrolled type 2 diabetes, previously on basal insulin only, with a very high HbA1c, at 10.3%, now also on SGLT 2 inhibitor and Metformin ER.  She was previously on Victoza, which she stopped due to diarrhea, but retrospectively, this could have been related to Metformin insulins release.  We tried to start Ozempic, however, she could not tolerate it (she did start at the higher dose, 0.5 mg weekly, rather than the recommended 0.25 mg weekly).  She is open to trying Trulicity if needed in the future. -At last visit, sugars are much better, 50% lower than before, but she was only checking in the morning and we discussed about the importance of checking later in the day, also.  At that time, HbA1c returned at 8.7%, slightly lower. At last visit we tried to start [place] but this was not covered. We discussed that other medications in this class can be covered but for now, we decided to proceed  with Trulicity. Given coupon card. Discussed about possible side effects and also benefits. We will start at a low dose and increase as tolerated. -He knows he needs to improve his diet and stop eating at night, after dinner. I gave him written recommendations for healthier diet. - I suggested to:  Patient Instructions  Please continue: - Metformin ER 500 mg 2x a day - Lantus 50 units at bedtime  Please start Trulicity [id number] mg weekly. Let me know when you are close to running out to call in the higher dose to your pharmacy (1.5 mg).  Please return in 1.5 months with your sugar log.   - we checked his HbA1c: 8.8% (slightly higher) - advised to check sugars at different times of the day - 1-2x a day, rotating check times - advised for yearly eye exams >> he is not UTD - return to clinic in 1.5 months -per his preference, since he needs a new A1c to be cleared for surgery. I explained that only a drastic change in the blood sugars may induce changes in the HbA1c in such a short time  2. HL -Reviewed latest lipid panel from 06/2019: LDL higher than goal, increase, triglycerides high Lab Results  Component Value Date   CHOL 208 (H) 06/29/2019   HDL 41.60 06/29/2019   LDLCALC 61 11/10/2018   LDLDIRECT 138.0 06/29/2019   TRIG 288.0 (H) 06/29/2019   CHOLHDL 5 06/29/2019  -Continues the statin without side effects  3.  Obesity class II -Before last visit, he gained 12 pounds in 3 more since last visit -Unfortunately, he did not tolerate Ozempic which would have helped with weight loss. -Also, Wilder Glade was not covered -At this visit we will try to  start Trulicity, which should also help with weight loss  Philemon Kingdom, MD PhD [facility] Endocrinology

## 2020-03-07 MED FILL — ATORVASTATIN 20 MG TABLET: 20 | 30 days supply | Qty: 30 | Fill #2

## 2020-03-07 MED FILL — metFORMIN HCL ER 500 MG TB2: 500 | 30 days supply | Qty: 60 | Fill #5

## 2020-03-07 MED FILL — LANTUS SOLOSTAR 100 UNITS/M: 100 | 30 days supply | Qty: 12 | Fill #7

## 2020-03-07 MED FILL — LOSARTAN POTASSIUM 100 MG T: 100 | 30 days supply | Qty: 30 | Fill #3

## 2020-04-02 ENCOUNTER — Other Ambulatory Visit: Payer: Self-pay | Admitting: Medical

## 2020-04-02 MED FILL — metFORMIN HCL ER 500 MG TB2: 500 | 30 days supply | Qty: 60 | Fill #6

## 2020-04-02 MED FILL — LANTUS SOLOSTAR 100 UNITS/M: 100 | 30 days supply | Qty: 12 | Fill #8

## 2020-04-02 MED FILL — ATORVASTATIN 20 MG TABLET: 20 | 30 days supply | Qty: 30 | Fill #3

## 2020-04-02 MED FILL — LOSARTAN POTASSIUM 100 MG T: 100 | 30 days supply | Qty: 30 | Fill #0

## 2020-04-04 ENCOUNTER — Other Ambulatory Visit: Payer: Self-pay

## 2020-04-04 ENCOUNTER — Encounter: Payer: Self-pay | Admitting: Internal Medicine

## 2020-04-04 ENCOUNTER — Other Ambulatory Visit: Payer: Self-pay | Admitting: Internal Medicine

## 2020-04-04 ENCOUNTER — Ambulatory Visit (INDEPENDENT_AMBULATORY_CARE_PROVIDER_SITE_OTHER): Payer: PRIVATE HEALTH INSURANCE | Admitting: Internal Medicine

## 2020-04-04 VITALS — BP 130/90 | HR 90 | Ht 69.0 in | Wt 266.0 lb

## 2020-04-04 DIAGNOSIS — E1165 Type 2 diabetes mellitus with hyperglycemia: Secondary | ICD-10-CM

## 2020-04-04 DIAGNOSIS — E1159 Type 2 diabetes mellitus with other circulatory complications: Secondary | ICD-10-CM | POA: Diagnosis not present

## 2020-04-04 DIAGNOSIS — E669 Obesity, unspecified: Secondary | ICD-10-CM | POA: Diagnosis not present

## 2020-04-04 DIAGNOSIS — E782 Mixed hyperlipidemia: Secondary | ICD-10-CM | POA: Diagnosis not present

## 2020-04-04 LAB — POCT GLYCOSYLATED HEMOGLOBIN (HGB A1C): Hemoglobin A1C: 8.7 % — AB (ref 4.0–5.6)

## 2020-04-04 MED ORDER — METFORMIN HCL ER 500 MG PO TB24: 2000.0000 mg | ORAL_TABLET | Freq: Every day | ORAL | 3 refills | Status: DC

## 2020-04-04 MED ORDER — GLIPIZIDE 5 MG PO TABS: 5.0000 mg | ORAL_TABLET | Freq: Two times a day (BID) | ORAL | 3 refills | Status: DC

## 2020-04-04 MED FILL — glipiZIDE 5 MG TABS: 5 | 90 days supply | Qty: 180 | Fill #0

## 2020-04-04 NOTE — Addendum Note (Signed)
Addended by: Cardell Peach I on: 04/04/2020 08:59 AM   Modules accepted: Orders

## 2020-04-04 NOTE — Progress Notes (Signed)
Patient ID: Kevin Wiggins, male   DOB: September 16, 1961, 59 y.o.   MRN: [id number]   This visit occurred during the SARS-CoV-2 public health emergency.  Safety protocols were in place, including screening questions prior to the visit, additional usage of staff PPE, and extensive cleaning of exam room while observing appropriate contact time as indicated for disinfecting solutions.   HPI: Kevin Wiggins is a 59 y.o.-year-old male, initially referred by his PCP, Mackie Pai, PA-C, returning for follow-up for DM2, dx in ~2014, insulin-dependent in 2019, uncontrolled, withlong-term complications (CHF - resolved after sx.).  Last visit 1.5 months ago.  He continues to have shoulder pain and he had surgery for this.  However, he needs to have a second surgery but this was canceled in 05/2019 due to the high HbA1c. The sx is now schedule 05/04/2020. He has been out of work due to shoulder pain.  He is working with his chiropractor for his knee pain >> feels much better -and could even do some running yesterday.   He just returned from vacation at the beach where she went for a week.  Sugars were higher while there.  Reviewed HbA1c levels: Lab Results  Component Value Date   HGBA1C 8.8 (A) 02/22/2020   HGBA1C 8.7 (A) 12/14/2019   HGBA1C 10.3 (A) 09/08/2019   HGBA1C 10.3 (H) 06/29/2019   HGBA1C 11.7 (H) 11/10/2018   HGBA1C 11.0 (H) 06/05/2017   HGBA1C 10.0 (H) 03/04/2017   HGBA1C 7.6 (H) 10/01/2016   HGBA1C 8.0 (H) 05/19/2016   HGBA1C 11.2 (H) 11/22/2015   HGBA1C 9.0 (H) 08/20/2015   HGBA1C 7.9 (H) 04/19/2015   HGBA1C 6.6 (H) 07/22/2013   Previously on: - Lantus 10 >> 38 units at bedtime He was previously on Victoza 1.8 mg daily in a.m. >> stopped 2/2 diarrhea. He was on Metformin 1000 mg 2x a day, with meals, but stopped 2/2 diarrhea  He is on: - Metformin ER 500 mg 2x a day: Lunch and dinner - Lantus 38 >> 50 units at bedtime We tried to add Trulicity 0.25 mg weekly - rec's 02/2020 >> too  expensive We tried to start Farxiga 5 mg before breakfast in  12/2019- did not start - not covered He could not tolerate Ozempic (mistakenly started at 0.5 mg weekly): eructations, nausea, diarrhea.  Pt checks his sugars once a day: - am: 180, 310-330 >> 136-158, 178 >> 124, 154-193, 210 >> 154-216, 247 - 2h after b'fast: n/c - before lunch: n/c - 2h after lunch: n/c - before dinner: n/c - 2h after dinner: n/c - bedtime: n/c - nighttime: n/c Lowest sugar was 180 >> 136 >> 124 >> 154; it is unclear at which level he has hypoglycemia awareness. Highest sugar was 300s >> 178 >> 210 >> 247.  Glucometer: One Touch Ultra mini  Pt's meals are: - Breakfast: usually coffee - Lunch: sandwich or soup - Dinner: meat (chicken) + (starch) + veggies - Snacks: chips, fruit He uses Sugar free packets - in water.  He occasionally drinks a Monster energy drink.  He was drinking these daily in the past.  -N no CKD, last BUN/creatinine:  Lab Results  Component Value Date   BUN 15 06/29/2019   BUN 14 11/10/2018   CREATININE 0.97 06/29/2019   CREATININE 1.05 11/10/2018  On losartan 100.  -+ HL last set of lipids: Lab Results  Component Value Date   CHOL 208 (H) 06/29/2019   HDL 41.60 06/29/2019   LDLCALC 61 11/10/2018  LDLDIRECT 138.0 06/29/2019   TRIG 288.0 (H) 06/29/2019   CHOLHDL 5 06/29/2019  On Lipitor 20.  - last eye exam was in 2018: Reportedly no DR  -no numbness and tingling in his feet.  Pt has FH of DM in M and F.  He also has a history of hypogonadism and is on testosterone supplementation. He has hemochromatosis - sees Dr. Marin Olp. No recet need for phlebotomies.  ROS: Constitutional: no weight gain/no weight loss, no fatigue, no subjective hyperthermia, no subjective hypothermia Eyes: no blurry vision, no xerophthalmia ENT: no sore throat, no nodules palpated in neck, no dysphagia, no odynophagia, no hoarseness Cardiovascular: no CP/no SOB/no palpitations/no leg  swelling Respiratory: no cough/no SOB/no wheezing Gastrointestinal: no N/no V/no D/no C/no acid reflux Musculoskeletal: no muscle aches/+ joint aches (shoulder) Skin: no rashes, no hair loss Neurological: no tremors/no numbness/no tingling/no dizziness  I reviewed pt's medications, allergies, PMH, social hx, family hx, and changes were documented in the history of present illness. Otherwise, unchanged from my initial visit note.  Past Medical History:  Diagnosis Date  . CAD (coronary artery disease) cardiologist-  dr dalton mclean   a. 07/2013: mild nonobstructive CAD (30% RCA)  by cath 07/25/13 as part of eval for VSD/SOB.  . CKD (chronic kidney disease), stage II   . Diastolic CHF ([place])    a. Acute diastolic CHF 77/8242 in the setting of perimembranous VSD. Eval underway given mod pulm HTN and RV dilation.  . ED (erectile dysfunction)   . GERD (gastroesophageal reflux disease)   . Heart murmur   . Hereditary hemochromatosis [facility]) hemotologist/ oncologist-  dr Marin Olp   H63D Homozyous mutation--  treatment phlebotomy (last one 12-03-2016) for HCT < 48%  . Hyperlipidemia   . Hypertension   . LAFB (left anterior fascicular block)   . Mass of soft tissue of right upper extremity   . OSA (obstructive sleep apnea)    severe osa per study 11-17-2013 --  no cpap/  per pt uses Breath Right strips  . RBBB   . Refusal of blood transfusions as patient is Jehovah's Witness   . 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 Secondary erythrocytosis    due to testosterone therapy  . Secondary testicular hypogonadism    due to hemochromatosis  . Type 2 diabetes mellitus, uncontrolled ([place])    followed by pcp--- last A1c 11.0 on 06-05-2017/  on 07-14-2017 pt stated does not check CBGs, asked pt about A1c being high he stated "doctor told him to watch carbs and did not add medication"    Past Surgical History:  Procedure Laterality Date  . LEFT AND RIGHT HEART CATHETERIZATION WITH CORONARY ANGIOGRAM N/A 07/25/2013   Procedure: LEFT AND RIGHT HEART CATHETERIZATION WITH CORONARY ANGIOGRAM;  Surgeon: Jolaine Artist, MD;  Location: [facility] CATH LAB;  Service: Cardiovascular;  Laterality: N/A; patent coronary arteries w/ non-obstructive CAD (30% RCA); moderate pulmonary HTN;  O2 saturations consistent w/ significant left-right intracardiac shunt  . MASS EXCISION Right 07/17/2017   Procedure: REMOVAL OF RIGHT UPPER ARM SOFT TISSUE MASS;  Surgeon: Jackolyn Confer, MD;  Location: [place];  Service: General;  Laterality: Right;  . REPAIR SINUS VALSALVA ANEURYSM W/ CPB (STERNOTOMY)  08-26-2013   @[facility]    "patched"  . TEE WITHOUT CARDIOVERSION N/A 07/25/2013   Procedure: TRANSESOPHAGEAL ECHOCARDIOGRAM (TEE);  Surgeon:  Dorothy Spark, MD;  Location: [facility] ENDOSCOPY;  Service: Cardiovascular;  Laterality: N/A; moderate dilated RV w/ mild decreased function; a small restrictive perimembranous VSD, peak velocity 4.104m/sec, peak gradient 55mmHg/ trivial AI/  trivial TR/ mild LAE/ no PFO/  mild aortic atherosclerotic plaque  . TRANSTHORACIC ECHOCARDIOGRAM  10-27-2013   dr Aundra Dubin   mild LVH, ef 55-60%/  mild LAE/  sinuses of valsalva appear normal/  PASP could not be accurately estimated (per dr Aundra Dubin stable s/p sinus of valsalva fistula repair)   Social History   Socioeconomic History  . Marital status: Married    Spouse name: Not on file  . Number of children: 2  . Years of education: Not on file  . Highest education level: Not on file  Occupational History    Employer:  [employer]    Comment: Maintenance   Social Needs  . Financial resource strain: Not on file  . Food insecurity    Worry: Not on file    Inability: Not on file  . Transportation needs    Medical: Not on file    Non-medical: Not on file  Tobacco Use  . Smoking status: Never Smoker  . Smokeless  tobacco: Never Used  Substance and Sexual Activity  . Alcohol use: No    Alcohol/week: 0.0 standard drinks  . Drug use: No   Current Outpatient Medications on File Prior to Visit  Medication Sig Dispense Refill  . atorvastatin (LIPITOR) 20 MG tablet Take 1 tablet (20 mg total) by mouth daily. 30 tablet 3  . Dulaglutide (TRULICITY) 6.59 DJ/5.7SV SOPN Inject 0.75 mg in am weekly under skin 4 pen 1  . glucose blood (ONETOUCH ULTRA) test strip Use to check blood sugar 2 times a day. 100 each 12  . HYDROcodone-acetaminophen (NORCO/VICODIN) 5-325 MG tablet hydrocodone 5 mg-acetaminophen 325 mg tablet  TAKE 1 TABLET BY MOUTH EVERY 6 (SIX) HOURS AS NEEDED FOR UP TO 5 DAYS.    [name] insulin glargine (LANTUS SOLOSTAR) 100 UNIT/ML Solostar Pen Inject 50 Units into the skin at bedtime. 10 pen 3  . losartan (COZAAR) 100 MG tablet Take 1 tablet (100 mg total) by mouth daily. 30 tablet 0  . metFORMIN (GLUCOPHAGE-XR) 500 MG 24 hr tablet Take 1 tablet (500 mg total) by mouth 2 (two) times daily with a meal. 180 tablet 3  . omeprazole (PRILOSEC) 20 MG capsule Take 20 mg by mouth every evening.     . sildenafil (VIAGRA) 100 MG tablet Take 0.5-1 tablets (50-100 mg total) by mouth daily as needed for erectile dysfunction. 5 tablet 3  . testosterone cypionate (DEPOTESTOSTERONE CYPIONATE) 200 MG/ML injection Inject 1 mL (200 mg total) into the muscle every 14 (fourteen) days. As needed now (Patient taking differently: Inject 200 mg into the muscle every 14 (fourteen) days. As needed now) 10 mL 3  . traMADol (ULTRAM) 50 MG tablet Take 1-2 tablets (50-100 mg total) by mouth every 6 (six) hours as needed. 30 tablet 0  . vardenafil (LEVITRA) 20 MG tablet Take 1 tablet (20 mg total) by mouth daily as needed for erectile dysfunction. (Patient taking differently: Take 20 mg by mouth daily as needed for erectile dysfunction. ) 10 tablet 0   No current facility-administered medications on file prior to visit.   Allergies   Allergen Reactions  . Amlodipine Other (See Comments)    Pt reports dizziness and sleepy.  Pt reports dizziness and sleepy.   . Lisinopril Other (See Comments)    cough  cough  . Ozempic (0.25 Or 0.5 Mg-Dose) [Semaglutide(0.25 Or 0.5mg -Dos)] Nausea Only   Family History  Problem Relation Age of Onset  . Cancer Mother   . Hypertension Father   . Congestive Heart Failure Father   . Colon cancer Paternal Aunt   . Hemochromatosis Unknown        family history   PE: BP 130/90   Pulse 90   Ht 5\' 9"  (1.753 m)   Wt 266 lb (120.7 kg)   SpO2 98%   BMI 39.28 kg/m  Wt Readings from Last 3 Encounters:  04/04/20 266 lb (120.7 kg)  02/22/20 267 lb (121.1 kg)  12/14/19 264 lb (119.7 kg)   Constitutional: overweight, in NAD Eyes: PERRLA, EOMI, no exophthalmos ENT: moist mucous membranes, no thyromegaly, no cervical lymphadenopathy Cardiovascular: RRR, No MRG Respiratory: CTA B Gastrointestinal: abdomen soft, NT, ND, BS+ Musculoskeletal: no deformities, strength intact in all 4 Skin: moist, warm, no rashes Neurological: no tremor with outstretched hands, DTR normal in all 4  ASSESSMENT: 1. DM2, insulin-dependent, uncontrolled, with complications - CHF  No personal history of pancreatitis or family history of medullary thyroid cancer.  2. HL  3. Obesity class 2  PLAN:  1. Patient with longstanding, uncontrolled, type 2 diabetes, previously on basal insulin only with a very high HbA1c at 10.3%, now also on Metformin ER and I recommended the GLP-1 receptor agonist at last visit, but unfortunately he could not afford this.  He was previously on Victoza which he stopped due to diarrhea, but retrospectively, this could have been related to Metformin IR.  He could not tolerate Ozempic because she forgot instructions about starting at a low dose and started directly at 0.5 mg weekly which caused GI side effects.  I recommended Wilder Glade but this was not covered by his insurance. -At last  visit, sugars are much better, 50% lower than before, but he was only checking in the morning and we discussed about the importance of checking later in the day.  He is still not checking them later in the day. -At last visit we discussed about stopping snacking after dinner and I also given references about improving his diet -He returns after 1.5 months from the previous because he needs a new A1c which is hopefully lower than before to allow him to have a second shoulder surgery. -She just returned from the beach where he had dietary indiscretions and sugars are higher.  However, reviewing his glucometer downloads, they were higher even before going to the beach.  He has occasional 190s and 200s in the morning.  We discussed that he absolutely needs to improve his diet. -At this visit, we we will increase his Metformin ER dose to 2000 mg daily and move it well with dinner, as he is tolerating this very well.  Will stay on the same dose of Lantus but I also suggested to add a sulfonylurea to help with postprandial hyperglycemia.  Unfortunately, he cannot afford an SGLT2 inhibitor or GLP-1 receptor agonist. - I suggested to:  Patient Instructions  Please increase: - Metformin ER 2000 mg with dinner  Continue: - Lantus 50 units at bedtime  Try to add: - Glipizide 5 mg 15-30 min before B and D  Please return in 3 months with your sugar log.   - we checked his HbA1c: 8.7% (still high -too high for surgery clearance) - advised to check sugars at different times of the day - 1x a day, rotating check times - advised  for yearly eye exams >> he is UTD - return to clinic in 3 months  2. HL -Reviewed latest lipid panel from 06/2019: LDL higher than goal, increased, high triglycerides, HDL at goal: Lab Results  Component Value Date   CHOL 208 (H) 06/29/2019   HDL 41.60 06/29/2019   LDLCALC 61 11/10/2018   LDLDIRECT 138.0 06/29/2019   TRIG 288.0 (H) 06/29/2019   CHOLHDL 5 06/29/2019  -Continues  the statin without side effects  3.  Obesity class II -He could not tolerate Ozempic, and Trulicity was not affordable.  These would have helped with weight loss. -Also, unfortunately, Wilder Glade was not covered.  This would also have helped with weight loss -Weight is approximately stable since last visit.  We discussed about the need to change his diet and he also plans to start more exercise.  Philemon Kingdom, MD PhD [facility] Endocrinology

## 2020-04-04 NOTE — Patient Instructions (Addendum)
Please increase: - Metformin ER 2000 mg with dinner  Continue: - Lantus 50 units at bedtime  Try to add: - Glipizide 5 mg 15-30 min before B and D  Please return in 3 months with your sugar log.

## 2020-04-25 ENCOUNTER — Other Ambulatory Visit: Payer: Self-pay | Admitting: Medical

## 2020-04-25 MED FILL — ATORVASTATIN 20 MG TABLET: 20 | 30 days supply | Qty: 30 | Fill #0

## 2020-04-25 MED FILL — metFORMIN HCL ER 500 MG TB2: 500 | 90 days supply | Qty: 360 | Fill #0

## 2020-04-25 MED FILL — LOSARTAN POTASSIUM 100 MG T: 100 | 30 days supply | Qty: 30 | Fill #0

## 2020-04-27 MED FILL — LANTUS SOLOSTAR 100 UNITS/M: 100 | 30 days supply | Qty: 12 | Fill #9

## 2020-05-24 ENCOUNTER — Other Ambulatory Visit: Payer: Self-pay | Admitting: Medical

## 2020-05-24 MED FILL — LANTUS SOLOSTAR 100 UNITS/M: 100 | 30 days supply | Qty: 12 | Fill #10

## 2020-05-24 MED FILL — LOSARTAN POTASSIUM 100 MG T: 100 | 30 days supply | Qty: 30 | Fill #0

## 2020-05-31 MED FILL — ATORVASTATIN CALCIUM 20 MG: 20 | 30 days supply | Qty: 30 | Fill #1

## 2020-06-20 MED FILL — LANTUS SOLOSTAR 100 UNITS/M: 100 | 30 days supply | Qty: 12 | Fill #11

## 2020-07-10 ENCOUNTER — Other Ambulatory Visit: Payer: Self-pay | Admitting: Medical

## 2020-07-10 MED FILL — ATORVASTATIN CALCIUM 20 MG: 20 | 30 days supply | Qty: 30 | Fill #2

## 2020-07-11 ENCOUNTER — Other Ambulatory Visit: Payer: Self-pay

## 2020-07-11 ENCOUNTER — Ambulatory Visit (INDEPENDENT_AMBULATORY_CARE_PROVIDER_SITE_OTHER): Payer: PRIVATE HEALTH INSURANCE | Admitting: Internal Medicine

## 2020-07-11 ENCOUNTER — Encounter: Payer: Self-pay | Admitting: Internal Medicine

## 2020-07-11 VITALS — BP 136/72 | HR 85 | Wt 271.0 lb

## 2020-07-11 DIAGNOSIS — E782 Mixed hyperlipidemia: Secondary | ICD-10-CM | POA: Diagnosis not present

## 2020-07-11 DIAGNOSIS — E1159 Type 2 diabetes mellitus with other circulatory complications: Secondary | ICD-10-CM | POA: Diagnosis not present

## 2020-07-11 DIAGNOSIS — E1165 Type 2 diabetes mellitus with hyperglycemia: Secondary | ICD-10-CM | POA: Diagnosis not present

## 2020-07-11 DIAGNOSIS — E669 Obesity, unspecified: Secondary | ICD-10-CM | POA: Diagnosis not present

## 2020-07-11 LAB — POCT GLYCOSYLATED HEMOGLOBIN (HGB A1C): Hemoglobin A1C: 7.8 % — AB (ref 4.0–5.6)

## 2020-07-11 NOTE — Addendum Note (Signed)
Addended by: Arnold Long R on: 07/11/2020 11:14 AM   Modules accepted: Orders

## 2020-07-11 NOTE — Progress Notes (Signed)
Patient ID: Kevin Wiggins, male   DOB: 06-15-1961, 59 y.o.   MRN: [id number]   This visit occurred during the SARS-CoV-2 public health emergency.  Safety protocols were in place, including screening questions prior to the visit, additional usage of staff PPE, and extensive cleaning of exam room while observing appropriate contact time as indicated for disinfecting solutions.   HPI: Kevin Wiggins is a 59 y.o.-year-old male, initially referred by his PCP, Kevin Pai, PA-C, returning for follow-up for DM2, dx in ~2014, insulin-dependent in 2019, uncontrolled, withlong-term complications (CHF - resolved after sx.).  Last visit 3 months ago.  He continues to have shoulder pain and had surgery for this.  However, he needs to have a second surgery but this was canceled in 05/2019 due to the high HbA1c (it needs to be lower than 8% to proceed with the surgery)  At last visit, he was working with his chiropractor for his knee pain.  He was feeling much better and started running.  Reviewed HbA1c levels: 04/27/2020: HbA1c 9.2% Lab Results  Component Value Date   HGBA1C 8.7 (A) 04/04/2020   HGBA1C 8.8 (A) 02/22/2020   HGBA1C 8.7 (A) 12/14/2019   HGBA1C 10.3 (A) 09/08/2019   HGBA1C 10.3 (H) 06/29/2019   HGBA1C 11.7 (H) 11/10/2018   HGBA1C 11.0 (H) 06/05/2017   HGBA1C 10.0 (H) 03/04/2017   HGBA1C 7.6 (H) 10/01/2016   HGBA1C 8.0 (H) 05/19/2016   HGBA1C 11.2 (H) 11/22/2015   HGBA1C 9.0 (H) 08/20/2015   HGBA1C 7.9 (H) 04/19/2015   HGBA1C 6.6 (H) 07/22/2013   Previously on: - Lantus 10 >> 38 units at bedtime He was previously on Victoza 1.8 mg daily in a.m. >> stopped 2/2 diarrhea. He was on Metformin 1000 mg 2x a day, with meals, but stopped 2/2 diarrhea  He is on: - Metformin ER 500 mg 2x a day: Lunch and dinner >> 2000 mg with dinner - Glipizide 5 mg twice a day before L and D -added 03/2020 - Lantus 38 >> 50 units at bedtime We tried to add Trulicity 7.67 mg weekly - rec's 02/2020 >> too  expensive We tried to start Farxiga 5 mg before breakfast in  12/2019- did not start - not covered He could not tolerate Ozempic (mistakenly started at 0.5 mg weekly): eructations, nausea, diarrhea.  Pt checks his sugars once a day: - am: 136-158, 178 >> 124, 154-193, 210 >> 154-216, 247 >> 128, 143-173, 228 after eating out - 2h after b'fast: n/c - before lunch: n/c - 2h after lunch: n/c - before dinner: n/c - 2h after dinner: n/c - bedtime: n/c - nighttime: n/c Lowest sugar was 180 >> 136 >> 124 >> 154 >> 128; it is unclear at which CBG level he has hypoglycemia awareness. Highest sugar was 300s >> 178 >> 210 >> 247.  Glucometer: One Touch Ultra mini  Pt's meals are: - Breakfast: usually coffee - Lunch: sandwich or soup - Dinner: meat (chicken) + (starch) + veggies - Snacks: chips, fruit He uses Sugar free packets - in water.  He occasionally drinks a Monster energy drink, but he was drinking these daily in the past.  -No CKD, last BUN/creatinine:  04/27/2020: Glucose 195, BUN/creatinine 17/1.16, GFR 69 Lab Results  Component Value Date   BUN 15 06/29/2019   BUN 14 11/10/2018   CREATININE 0.97 06/29/2019   CREATININE 1.05 11/10/2018  On losartan 100.  -+ HL; last set of lipids: Lab Results  Component Value Date  CHOL 208 (H) 06/29/2019   HDL 41.60 06/29/2019   LDLCALC 61 11/10/2018   LDLDIRECT 138.0 06/29/2019   TRIG 288.0 (H) 06/29/2019   CHOLHDL 5 06/29/2019  On Lipitor 20.  - last eye exam was in 2018, reportedly no DR.  - he denies numbness and tingling in his feet.  Pt has FH of DM in M and F.  He also has a history of hypogonadism and is on testosterone supplementation. He has hemochromatosis - sees Dr. Marin Wiggins. No recet need for phlebotomies.  ROS: Constitutional: + weight gain/no weight loss, no fatigue, no subjective hyperthermia, no subjective hypothermia Eyes: no blurry vision, no xerophthalmia ENT: no sore throat, no nodules palpated in neck, no  dysphagia, no odynophagia, no hoarseness Cardiovascular: no CP/no SOB/no palpitations/no leg swelling Respiratory: no cough/no SOB/no wheezing Gastrointestinal: no N/no V/no D/no C/no acid reflux Musculoskeletal: no muscle aches/+ joint aches (shoulder) Skin: no rashes, no hair loss Neurological: no tremors/no numbness/no tingling/no dizziness  I reviewed pt's medications, allergies, PMH, social hx, family hx, and changes were documented in the history of present illness. Otherwise, unchanged from my initial visit note.  Past Medical History:  Diagnosis Date  . CAD (coronary artery disease) cardiologist-  dr Kevin Wiggins   a. 07/2013: mild nonobstructive CAD (30% RCA)  by cath 07/25/13 as part of eval for VSD/SOB.  . CKD (chronic kidney disease), stage II   . Diastolic CHF ([place])    a. Acute diastolic CHF 56/4332 in the setting of perimembranous VSD. Eval underway given mod pulm HTN and RV dilation.  . ED (erectile dysfunction)   . GERD (gastroesophageal reflux disease)   . Heart murmur   . Hereditary hemochromatosis [facility]) hemotologist/ oncologist-  dr Kevin Wiggins   H63D Homozyous mutation--  treatment phlebotomy (last one 12-03-2016) for HCT < 48%  . Hyperlipidemia   . Hypertension   . LAFB (left anterior fascicular block)   . Mass of soft tissue of right upper extremity   . OSA (obstructive sleep apnea)    severe osa per study 11-17-2013 --  no cpap/  per pt uses Breath Right strips  . RBBB   . Refusal of blood transfusions as patient is Jehovah's Witness   . 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 Secondary erythrocytosis    due to testosterone therapy  . Secondary testicular hypogonadism    due to hemochromatosis  . Type 2 diabetes mellitus, uncontrolled ([place])    followed by pcp--- last A1c 11.0 on 06-05-2017/  on 07-14-2017 pt stated does not check CBGs, asked  pt about A1c being high he stated "doctor told him to watch carbs and did not add medication"   Past Surgical History:  Procedure Laterality Date  . LEFT AND RIGHT HEART CATHETERIZATION WITH CORONARY ANGIOGRAM N/A 07/25/2013   Procedure: LEFT AND RIGHT HEART CATHETERIZATION WITH CORONARY ANGIOGRAM;  Surgeon: Jolaine Artist, MD;  Location: [facility] CATH LAB;  Service: Cardiovascular;  Laterality: N/A; patent coronary arteries w/ non-obstructive CAD (30% RCA); moderate pulmonary HTN;  O2 saturations consistent w/ significant left-right intracardiac shunt  . MASS EXCISION Right 07/17/2017   Procedure: REMOVAL OF RIGHT UPPER ARM SOFT TISSUE MASS;  Surgeon: Jackolyn Confer, MD;  Location: [facility];  Service: General;  Laterality: Right;  . REPAIR SINUS VALSALVA ANEURYSM W/ CPB (STERNOTOMY)  08-26-2013   @[facility]    "  patched"  . TEE WITHOUT CARDIOVERSION N/A 07/25/2013   Procedure: TRANSESOPHAGEAL ECHOCARDIOGRAM (TEE);  Surgeon: Dorothy Spark, MD;  Location: [facility] ENDOSCOPY;  Service: Cardiovascular;  Laterality: N/A; moderate dilated RV w/ mild decreased function; a small restrictive perimembranous VSD, peak velocity 4.89m/sec, peak gradient 38mmHg/ trivial AI/  trivial TR/ mild LAE/ no PFO/  mild aortic atherosclerotic plaque  . TRANSTHORACIC ECHOCARDIOGRAM  10-27-2013   dr Aundra Dubin   mild LVH, ef 55-60%/  mild LAE/  sinuses of valsalva appear normal/  PASP could not be accurately estimated (per dr Aundra Dubin stable s/p sinus of valsalva fistula repair)   Social History   Socioeconomic History  . Marital status: Married    Spouse name: Not on file  . Number of children: 2  . Years of education: Not on file  . Highest education level: Not on file  Occupational History    Employer:  [employer]    Comment: Maintenance   Social Needs  . Financial resource strain: Not on file  . Food insecurity    Worry: Not on file    Inability: Not on file  . Transportation needs    Medical: Not on  file    Non-medical: Not on file  Tobacco Use  . Smoking status: Never Smoker  . Smokeless tobacco: Never Used  Substance and Sexual Activity  . Alcohol use: No    Alcohol/week: 0.0 standard drinks  . Drug use: No   Current Outpatient Medications on File Prior to Visit  Medication Sig Dispense Refill  . atorvastatin (LIPITOR) 20 MG tablet TAKE 1 TABLET (20 MG TOTAL) BY MOUTH DAILY. 30 tablet 3  . glipiZIDE (GLUCOTROL) 5 MG tablet Take 1 tablet (5 mg total) by mouth 2 (two) times daily before a meal. 180 tablet 3  . glucose blood (ONETOUCH ULTRA) test strip Use to check blood sugar 2 times a day. 100 each 12  . HYDROcodone-acetaminophen (NORCO/VICODIN) 5-325 MG tablet hydrocodone 5 mg-acetaminophen 325 mg tablet  TAKE 1 TABLET BY MOUTH EVERY 6 (SIX) HOURS AS NEEDED FOR UP TO 5 DAYS.    [name] insulin glargine (LANTUS SOLOSTAR) 100 UNIT/ML Solostar Pen Inject 50 Units into the skin at bedtime. 10 pen 3  . losartan (COZAAR) 100 MG tablet Take 1 tablet (100 mg total) by mouth daily. 30 tablet 0  . metFORMIN (GLUCOPHAGE-XR) 500 MG 24 hr tablet Take 4 tablets (2,000 mg total) by mouth daily with supper. 360 tablet 3  . omeprazole (PRILOSEC) 20 MG capsule Take 20 mg by mouth every evening.     . sildenafil (VIAGRA) 100 MG tablet Take 0.5-1 tablets (50-100 mg total) by mouth daily as needed for erectile dysfunction. 5 tablet 3  . testosterone cypionate (DEPOTESTOSTERONE CYPIONATE) 200 MG/ML injection Inject 1 mL (200 mg total) into the muscle every 14 (fourteen) days. As needed now (Patient taking differently: Inject 200 mg into the muscle every 14 (fourteen) days. As needed now) 10 mL 3  . traMADol (ULTRAM) 50 MG tablet Take 1-2 tablets (50-100 mg total) by mouth every 6 (six) hours as needed. 30 tablet 0  . vardenafil (LEVITRA) 20 MG tablet Take 1 tablet (20 mg total) by mouth daily as needed for erectile dysfunction. (Patient taking differently: Take 20 mg by mouth daily as needed for erectile  dysfunction. ) 10 tablet 0   No current facility-administered medications on file prior to visit.   Allergies  Allergen Reactions  . Amlodipine Other (See Comments)    Pt reports dizziness  and sleepy.  Pt reports dizziness and sleepy.   . Lisinopril Other (See Comments)    cough cough  . Ozempic (0.25 Or 0.5 Mg-Dose) [Semaglutide(0.25 Or 0.5mg -Dos)] Nausea Only   Family History  Problem Relation Age of Onset  . Cancer Mother   . Hypertension Father   . Congestive Heart Failure Father   . Colon cancer Paternal Aunt   . Hemochromatosis Unknown        family history   PE: BP 136/72   Pulse 85   Wt 271 lb (122.9 kg)   SpO2 98%   BMI 40.02 kg/m  Wt Readings from Last 3 Encounters:  07/11/20 271 lb (122.9 kg)  04/04/20 266 lb (120.7 kg)  02/22/20 267 lb (121.1 kg)   Constitutional: overweight, in NAD Eyes: PERRLA, EOMI, no exophthalmos ENT: moist mucous membranes, no thyromegaly, no cervical lymphadenopathy Cardiovascular: RRR, No MRG Respiratory: CTA B Gastrointestinal: abdomen soft, NT, ND, BS+ Musculoskeletal: no deformities, strength intact in all 4 Skin: moist, warm, no rashes Neurological: + mild tremor with outstretched hands (genetic), DTR normal in all 4  ASSESSMENT: 1. DM2, insulin-dependent, uncontrolled, with complications - CHF  No personal history of pancreatitis or family history of medullary thyroid cancer.  2. HL  3. Obesity class 2  PLAN:  1. Patient with longstanding, uncontrolled, type 2 diabetes, previously on basal insulin only, with a very high HbA1c of 10.3%, now also on Metformin and glipizide.  I did recommend a GLP-1 receptor agonist in the past but unfortunately could not afford this.  He was previously on Victoza, which she had to stop due to diarrhea.  Retrospectively, this could have been related to Metformin IR, though.  He could not tolerate Ozempic as he started directly at the highest dose by mistake.  I recommended Wilder Glade but this  was not covered by his insurance. -At last visit, sugars were improved but he was only checking in the morning and we discussed about the importance of checking later in the day, also.  I advised him about stopping snacking after dinner and also gave him references about improving his diet.  At last visit, since sugars were still above target, we increased the Metformin dose to 2000 mg daily and moved it with dinner.  We kept the same dose of Lantus but we also added glipizide before breakfast and dinner.  HbA1c at that time was 8.7%. He had another HbA1c obtained 04/27/2020 and this was even higher, at 9.2%. -At today's visit, per review of his meter downloads, sugars have improved in the morning, but they are still almost 100% above target.  He is not checking sugars later in the day and I strongly advised him to do that.  I at least advised him to check sugars at bedtime or 2 hours after dinner so that we will know whether he needs a higher dose of Lantus or not.  For now, I did advise him to continue the same regimen but to take a double dose of glipizide before a larger meal, since he mentions that his sugars in the morning are higher after a large dinner. - I suggested to:  Patient Instructions  Please continue: - Metformin ER 2000 mg with dinner - Glipizide 5 mg 15-30 min before breakfast and dinner (you may take 10 mg before a large dinner or if you have dessert after dinner) - Lantus 50 units at bedtime  Check some sugars at bedtime or 2h after dinner.  Please return in 3  months with your sugar log.   - we checked his HbA1c: 7.8% (better) >> he is cleared for surgery from his diabetes point of view. - advised to check sugars at different times of the day - 1-2x a day, rotating check times - advised for yearly eye exams >> he is not UTD - return to clinic in 3 months  2. HL -Reviewed latest lipid panel from 06/2019: LDL higher than goal, increased, triglycerides high, HDL at goal: Lab  Results  Component Value Date   CHOL 208 (H) 06/29/2019   HDL 41.60 06/29/2019   LDLCALC 61 11/10/2018   LDLDIRECT 138.0 06/29/2019   TRIG 288.0 (H) 06/29/2019   CHOLHDL 5 06/29/2019  -Continues Lipitor 20 without side effects  3.  Obesity class II -Unfortunately, he could not tolerate Ozempic and Trulicity was not affordable.  These without help with weight loss -Also, Wilder Glade was not covered.  This, too, would have helped with weight loss -At last visit we discussed about the need to change his diet and also he was planning to start a more consistent exercise routine -At this visit, however, he gained 5 pounds.  Philemon Kingdom, MD PhD [facility] Endocrinology

## 2020-07-11 NOTE — Patient Instructions (Addendum)
Please continue: - Metformin ER 2000 mg with dinner - Glipizide 5 mg 15-30 min before breakfast and dinner (you may take 10 mg before a large dinner or if you have dessert after dinner) - Lantus 50 units at bedtime  Check some sugars at bedtime or 2h after dinner.  Please return in 3 months with your sugar log.

## 2020-07-16 MED FILL — LANTUS SOLOSTAR 100 UNITS/M: 100 | 30 days supply | Qty: 12 | Fill #12

## 2020-07-18 ENCOUNTER — Ambulatory Visit (HOSPITAL_BASED_OUTPATIENT_CLINIC_OR_DEPARTMENT_OTHER)
Admission: RE | Admit: 2020-07-18 | Discharge: 2020-07-18 | Disposition: A | Discharge status: Home / Self Care | Payer: PRIVATE HEALTH INSURANCE | Source: Ambulatory Visit | Attending: Medical | Admitting: Medical

## 2020-07-18 ENCOUNTER — Other Ambulatory Visit: Payer: Self-pay

## 2020-07-18 ENCOUNTER — Encounter: Payer: Self-pay | Admitting: Medical

## 2020-07-18 ENCOUNTER — Ambulatory Visit (INDEPENDENT_AMBULATORY_CARE_PROVIDER_SITE_OTHER): Payer: PRIVATE HEALTH INSURANCE | Admitting: Medical

## 2020-07-18 ENCOUNTER — Other Ambulatory Visit: Payer: Self-pay | Admitting: Medical

## 2020-07-18 VITALS — BP 135/80 | HR 72 | Temp 98.1°F | Resp 18 | Ht 69.0 in | Wt 274.0 lb

## 2020-07-18 DIAGNOSIS — Z8679 Personal history of other diseases of the circulatory system: Secondary | ICD-10-CM

## 2020-07-18 DIAGNOSIS — E785 Hyperlipidemia, unspecified: Secondary | ICD-10-CM | POA: Diagnosis not present

## 2020-07-18 DIAGNOSIS — K635 Polyp of colon: Secondary | ICD-10-CM

## 2020-07-18 DIAGNOSIS — I10 Essential (primary) hypertension: Secondary | ICD-10-CM | POA: Diagnosis not present

## 2020-07-18 DIAGNOSIS — Z125 Encounter for screening for malignant neoplasm of prostate: Secondary | ICD-10-CM | POA: Diagnosis not present

## 2020-07-18 DIAGNOSIS — E119 Type 2 diabetes mellitus without complications: Secondary | ICD-10-CM

## 2020-07-18 MED ORDER — LOSARTAN POTASSIUM 100 MG PO TABS: 100.0000 mg | ORAL_TABLET | Freq: Every day | ORAL | 3 refills | Status: DC

## 2020-07-18 MED FILL — LOSARTAN POTASSIUM 100 MG T: 100 | 90 days supply | Qty: 90 | Fill #0

## 2020-07-18 NOTE — Progress Notes (Signed)
Subjective:    Patient ID: Kevin Wiggins, male    DOB: 13-Nov-1960, 59 y.o.   MRN: [id number]  HPI  Pt in for follow up.  Reviewed pt last endocrinology visit.  A1c of 10.3. Plans made see A/P for date 07/11/2020.  Pt has hx of htn. No cardiac or neurologic signs or symptoms. Pt has not been checking bp at home. Pt ran out of meds Told to follow up.  Hx of high cholesterol. He is on atorvastatin.  Hx of gerd. On omeprazole in past.Now just taking as needed.   Pt has not been vaccinated for covid.   Pt has hx of rt shoulder injury. He had surgery in past. Scheduled for 2nd surgery but cancelled due to high A1c.    Pt states he lost his job secondary to injury. Pt states they told him he was not able to do job due to injury.  History of congestive heart failure. Hx of mild lvh in past. Has not seen his cardiologist for years.   A/P from 2017 visit with cardiologist. 1. Ruptured sinus of valsalva aneurysm: s/p patch repair at [facility] in 11/14. He is doing will no SOB.  He does not have a significant murmur on exam. Baseline echo post-op in 1/15 was stable. reminded to  use antibiotics with dental work.  follow up in 1 year.  2. Chronic diastolic CHF: Suspect this was related to LV volume load from ruptured SOV aneurysm, now resolved.  Review of Systems  Constitutional: Negative for chills, fatigue and fever.  Respiratory: Negative for cough, chest tightness, shortness of breath and wheezing.   Cardiovascular: Negative for chest pain and palpitations.  Gastrointestinal: Negative for abdominal pain, anal bleeding, constipation, nausea and vomiting.  Genitourinary: Negative for dysuria, flank pain and frequency.  Musculoskeletal: Negative for back pain and joint swelling.  Skin: Negative for rash.  Neurological: Negative for dizziness, speech difficulty, weakness, numbness and headaches.  Hematological: Negative for adenopathy. Does not bruise/bleed easily.    Psychiatric/Behavioral: Negative for behavioral problems, decreased concentration and sleep disturbance. The patient is not nervous/anxious.     Past Medical History:  Diagnosis Date  . CAD (coronary artery disease) cardiologist-  dr dalton mclean   a. 07/2013: mild nonobstructive CAD (30% RCA)  by cath 07/25/13 as part of eval for VSD/SOB.  . CKD (chronic kidney disease), stage II   . Diastolic CHF ([place])    a. Acute diastolic CHF 23/7628 in the setting of perimembranous VSD. Eval underway given mod pulm HTN and RV dilation.  . ED (erectile dysfunction)   . GERD (gastroesophageal reflux disease)   . Heart murmur   . Hereditary hemochromatosis [facility]) hemotologist/ oncologist-  dr Marin Olp   H63D Homozyous mutation--  treatment phlebotomy (last one 12-03-2016) for HCT < 48%  . Hyperlipidemia   . Hypertension   . LAFB (left anterior fascicular block)   . Mass of soft tissue of right upper extremity   . OSA (obstructive sleep apnea)    severe osa per study 11-17-2013 --  no cpap/  per pt uses Breath Right strips  . RBBB   . Refusal of blood transfusions as patient is Jehovah's Witness   . 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 Secondary erythrocytosis    due to testosterone therapy  . Secondary testicular hypogonadism  due to hemochromatosis  . Type 2 diabetes mellitus, uncontrolled ([place])    followed by pcp--- last A1c 11.0 on 06-05-2017/  on 07-14-2017 pt stated does not check CBGs, asked pt about A1c being high he stated "doctor told him to watch carbs and did not add medication"     Social History   Socioeconomic History  . Marital status: Married    Spouse name: Not on file  . Number of children: Not on file  . Years of education: Not on file  . Highest education level: Not on file  Occupational History    Employer: [employer]    Comment:  Maintenance   Tobacco Use  . Smoking status: Never Smoker  . Smokeless tobacco: Never Used  Substance and Sexual Activity  . Alcohol use: No    Alcohol/week: 0.0 standard drinks  . Drug use: No  . Sexual activity: Not on file  Other Topics Concern  . Not on file  Social History Narrative  . Not on file   Social Determinants of Health   Financial Resource Strain:   . Difficulty of Paying Living Expenses: Not on file  Food Insecurity:   . Worried About Charity fundraiser in the Last Year: Not on file  . Ran Out of Food in the Last Year: Not on file  Transportation Needs:   . Lack of Transportation (Medical): Not on file  . Lack of Transportation (Non-Medical): Not on file  Physical Activity:   . Days of Exercise per Week: Not on file  . Minutes of Exercise per Session: Not on file  Stress:   . Feeling of Stress : Not on file  Social Connections:   . Frequency of Communication with Friends and Family: Not on file  . Frequency of Social Gatherings with Friends and Family: Not on file  . Attends Religious Services: Not on file  . Active Member of Clubs or Organizations: Not on file  . Attends Archivist Meetings: Not on file  . Marital Status: Not on file  Intimate Partner Violence:   . Fear of Current or Ex-Partner: Not on file  . Emotionally Abused: Not on file  . Physically Abused: Not on file  . Sexually Abused: Not on file    Past Surgical History:  Procedure Laterality Date  . LEFT AND RIGHT HEART CATHETERIZATION WITH CORONARY ANGIOGRAM N/A 07/25/2013   Procedure: LEFT AND RIGHT HEART CATHETERIZATION WITH CORONARY ANGIOGRAM;  Surgeon: Jolaine Artist, MD;  Location: [facility] CATH LAB;  Service: Cardiovascular;  Laterality: N/A; patent coronary arteries w/ non-obstructive CAD (30% RCA); moderate pulmonary HTN;  O2 saturations consistent w/ significant left-right intracardiac shunt  . MASS EXCISION Right 07/17/2017   Procedure: REMOVAL OF RIGHT UPPER ARM SOFT  TISSUE MASS;  Surgeon: Jackolyn Confer, MD;  Location: [place];  Service: General;  Laterality: Right;  . REPAIR SINUS VALSALVA ANEURYSM W/ CPB (STERNOTOMY)  08-26-2013   @[facility]    "patched"  . TEE WITHOUT CARDIOVERSION N/A 07/25/2013   Procedure: TRANSESOPHAGEAL ECHOCARDIOGRAM (TEE);  Surgeon: Dorothy Spark, MD;  Location[facility] ENDOSCOPY;  Service: Cardiovascular;  Laterality: N/A; moderate dilated RV w/ mild decreased function; a small restrictive perimembranous VSD, peak velocity 4.60m/sec, peak gradient 60mmHg/ trivial AI/  trivial TR/ mild LAE/ no PFO/  mild aortic atherosclerotic plaque  . TRANSTHORACIC ECHOCARDIOGRAM  10-27-2013   dr Aundra Dubin   mild LVH, ef 55-60%/  mild LAE/  sinuses of valsalva appear normal/  PASP could not  be accurately estimated (per dr Aundra Dubin stable s/p sinus of valsalva fistula repair)    Family History  Problem Relation Age of Onset  . Cancer Mother   . Hypertension Father   . Congestive Heart Failure Father   . Colon cancer Paternal Aunt   . Hemochromatosis Other        family history    Allergies  Allergen Reactions  . Amlodipine Other (See Comments)    Pt reports dizziness and sleepy.  Pt reports dizziness and sleepy.   . Lisinopril Other (See Comments)    cough cough  . Ozempic (0.25 Or 0.5 Mg-Dose) [Semaglutide(0.25 Or 0.5mg -Dos)] Nausea Only    Current Outpatient Medications on File Prior to Visit  Medication Sig Dispense Refill  . atorvastatin (LIPITOR) 20 MG tablet TAKE 1 TABLET (20 MG TOTAL) BY MOUTH DAILY. 30 tablet 3  . glipiZIDE (GLUCOTROL) 5 MG tablet Take 1 tablet (5 mg total) by mouth 2 (two) times daily before a meal. 180 tablet 3  . glucose blood (ONETOUCH ULTRA) test strip Use to check blood sugar 2 times a day. 100 each 12  . insulin glargine (LANTUS SOLOSTAR) 100 UNIT/ML Solostar Pen Inject 50 Units into the skin at bedtime. 10 pen 3  . losartan (COZAAR) 100 MG tablet Take 1 tablet (100 mg total) by mouth daily.  30 tablet 0  . metFORMIN (GLUCOPHAGE-XR) 500 MG 24 hr tablet Take 4 tablets (2,000 mg total) by mouth daily with supper. 360 tablet 3  . omeprazole (PRILOSEC) 20 MG capsule Take 20 mg by mouth every evening.     . sildenafil (VIAGRA) 100 MG tablet Take 0.5-1 tablets (50-100 mg total) by mouth daily as needed for erectile dysfunction. 5 tablet 3  . testosterone cypionate (DEPOTESTOSTERONE CYPIONATE) 200 MG/ML injection Inject 1 mL (200 mg total) into the muscle every 14 (fourteen) days. As needed now (Patient taking differently: Inject 200 mg into the muscle every 14 (fourteen) days. As needed now) 10 mL 3  . vardenafil (LEVITRA) 20 MG tablet Take 1 tablet (20 mg total) by mouth daily as needed for erectile dysfunction. (Patient taking differently: Take 20 mg by mouth daily as needed for erectile dysfunction. ) 10 tablet 0  . HYDROcodone-acetaminophen (NORCO/VICODIN) 5-325 MG tablet hydrocodone 5 mg-acetaminophen 325 mg tablet  TAKE 1 TABLET BY MOUTH EVERY 6 (SIX) HOURS AS NEEDED FOR UP TO 5 DAYS. (Patient not taking: Reported on 07/18/2020)    . traMADol (ULTRAM) 50 MG tablet Take 1-2 tablets (50-100 mg total) by mouth every 6 (six) hours as needed. (Patient not taking: Reported on 07/18/2020) 30 tablet 0   No current facility-administered medications on file prior to visit.    BP (!) 146/92   Pulse 72   Temp 98.1 F (36.7 C) (Oral)   Resp 18   Ht 5\' 9"  (1.753 m)   Wt 274 lb (124.3 kg)   SpO2 97%   BMI 40.46 kg/m      Objective:   Physical Exam   General Mental Status- Alert. General Appearance- Not in acute distress.   Skin General: Color- Normal Color. Moisture- Normal Moisture.  Neck Carotid Arteries- Normal color. Moisture- Normal Moisture. No carotid bruits. No JVD.  Chest and Lung Exam Auscultation: Breath Sounds:-Normal.  Cardiovascular Auscultation:Rythm- Regular. Murmurs & Other Heart Sounds:Auscultation of the heart reveals- No  Murmurs.  Abdomen Inspection:-Inspeection Normal. Palpation/Percussion:Note:No mass. Palpation and Percussion of the abdomen reveal- Non Tender, Non Distended + BS, no rebound or guarding.   Neurologic  Cranial Nerve exam:- CN III-XII intact(No nystagmus), symmetric smile. Strength:- 5/5 equal and symmetric strength both upper and lower extremities.   Lower ext- no pedal edema.      Assessment & Plan:  Htn- bp is well controlled today. Continue losartan.   For high cholesterol will get cmp and lipid panel today. Continue atorvastatin.  For diabetes continue current treatment by endocrinologist.  For screening prostate cancer get psa today.  For chf hx, get cxr today, bnp lab and refer to cardiologist in our building to establish care.(Needs echo).  For hx of colon polyp. Refer back to GI MD for repeat colonoscopy.  Follow up date to be determined after lab review.  Mackie Pai, PA-C

## 2020-07-18 NOTE — Patient Instructions (Addendum)
Htn- bp is well controlled today. Continue losartan.   For high cholesterol will get cmp and lipid panel today. Continue atorvastatin.  For diabetes continue current treatment by endocrinologist.  For screening prostate cancer get psa today.  For chf hx, get cxr today, bnp lab and refer to cardiologist in our building to establish care.(Needs echo).  For hx of colon polyp. Refer back to GI MD for repeat colonoscopy.  Follow up date to be determined after lab review.

## 2020-07-19 LAB — COMPREHENSIVE METABOLIC PANEL
AG Ratio: 1.6 (calc) (ref 1.0–2.5)
ALT: 55 U/L — ABNORMAL HIGH (ref 9–46)
AST: 29 U/L (ref 10–35)
Albumin: 4.2 g/dL (ref 3.6–5.1)
Alkaline phosphatase (APISO): 55 U/L (ref 35–144)
BUN: 16 mg/dL (ref 7–25)
CO2: 28 mmol/L (ref 20–32)
Calcium: 9.5 mg/dL (ref 8.6–10.3)
Chloride: 102 mmol/L (ref 98–110)
Creat: 1.1 mg/dL (ref 0.70–1.33)
Globulin: 2.7 g/dL (calc) (ref 1.9–3.7)
Glucose, Bld: 171 mg/dL — ABNORMAL HIGH (ref 65–99)
Potassium: 4.5 mmol/L (ref 3.5–5.3)
Sodium: 139 mmol/L (ref 135–146)
Total Bilirubin: 0.8 mg/dL (ref 0.2–1.2)
Total Protein: 6.9 g/dL (ref 6.1–8.1)

## 2020-07-19 LAB — LIPID PANEL
Cholesterol: 135 mg/dL (ref <200)
HDL: 47 mg/dL (ref >=40)
LDL Cholesterol (Calc): 69 mg/dL (calc)
Non-HDL Cholesterol (Calc): 88 mg/dL (calc) (ref <130)
Total CHOL/HDL Ratio: 2.9 (calc) (ref <5.0)
Triglycerides: 100 mg/dL (ref <150)

## 2020-07-19 LAB — PSA: PSA: 0.32 ng/mL (ref <=4.0)

## 2020-07-19 LAB — BRAIN NATRIURETIC PEPTIDE: Brain Natriuretic Peptide: 19 pg/mL (ref <100)

## 2020-08-08 ENCOUNTER — Other Ambulatory Visit: Payer: Self-pay

## 2020-08-10 ENCOUNTER — Ambulatory Visit: Payer: PRIVATE HEALTH INSURANCE | Admitting: Cardiology

## 2020-08-10 ENCOUNTER — Other Ambulatory Visit: Payer: Self-pay | Admitting: Medical

## 2020-08-10 MED FILL — ATORVASTATIN CALCIUM 20 MG: 20 | 30 days supply | Qty: 30 | Fill #3

## 2020-08-10 MED FILL — metFORMIN HCL ER 500 MG TB2: 500 | 90 days supply | Qty: 360 | Fill #1

## 2020-08-10 MED FILL — glipiZIDE 5 MG TABS: 5 | 90 days supply | Qty: 180 | Fill #1

## 2020-08-15 ENCOUNTER — Other Ambulatory Visit: Payer: Self-pay

## 2020-08-15 ENCOUNTER — Other Ambulatory Visit: Payer: Self-pay | Admitting: Internal Medicine

## 2020-08-15 MED ORDER — LANTUS SOLOSTAR 100 UNIT/ML ~~LOC~~ SOPN: 50.0000 [IU] | PEN_INJECTOR | Freq: Every day | SUBCUTANEOUS | 1 refills | Status: DC

## 2020-08-15 MED FILL — LANTUS SOLOSTAR 100 UNITS/M: 100 | 24 days supply | Qty: 12 | Fill #0

## 2020-08-24 MED FILL — LANTUS SOLOSTAR 100 UNITS/M: 100 | 24 days supply | Qty: 12 | Fill #0

## 2020-09-10 ENCOUNTER — Other Ambulatory Visit: Payer: Self-pay | Admitting: Medical

## 2020-09-11 ENCOUNTER — Other Ambulatory Visit: Payer: Self-pay | Admitting: Medical

## 2020-09-11 MED FILL — ATORVASTATIN CALCIUM 20 MG: 20 | 90 days supply | Qty: 90 | Fill #0

## 2020-09-17 MED FILL — LANTUS SOLOSTAR 100 UNITS/M: 100 | 24 days supply | Qty: 12 | Fill #1

## 2020-10-11 ENCOUNTER — Ambulatory Visit: Payer: PRIVATE HEALTH INSURANCE | Admitting: Internal Medicine

## 2021-01-22 ENCOUNTER — Telehealth: Payer: Self-pay

## 2024-09-07 ENCOUNTER — Encounter: Payer: Self-pay | Admitting: Family

## 2024-10-06 ENCOUNTER — Encounter: Payer: Self-pay | Admitting: Family

## 2024-10-11 ENCOUNTER — Encounter: Payer: Self-pay | Admitting: Medical

## 2024-10-11 ENCOUNTER — Encounter: Payer: Self-pay | Admitting: Family

## 2024-10-11 ENCOUNTER — Ambulatory Visit (INDEPENDENT_AMBULATORY_CARE_PROVIDER_SITE_OTHER): Payer: PRIVATE HEALTH INSURANCE | Admitting: Medical

## 2024-10-11 ENCOUNTER — Other Ambulatory Visit (HOSPITAL_BASED_OUTPATIENT_CLINIC_OR_DEPARTMENT_OTHER): Payer: Self-pay

## 2024-10-11 VITALS — BP 122/78 | HR 89 | Temp 97.6°F | Resp 14 | Ht 69.0 in | Wt 265.8 lb

## 2024-10-11 DIAGNOSIS — Z794 Long term (current) use of insulin: Secondary | ICD-10-CM

## 2024-10-11 DIAGNOSIS — E1165 Type 2 diabetes mellitus with hyperglycemia: Secondary | ICD-10-CM

## 2024-10-11 DIAGNOSIS — Z1211 Encounter for screening for malignant neoplasm of colon: Secondary | ICD-10-CM | POA: Diagnosis not present

## 2024-10-11 DIAGNOSIS — Z125 Encounter for screening for malignant neoplasm of prostate: Secondary | ICD-10-CM | POA: Diagnosis not present

## 2024-10-11 DIAGNOSIS — I10 Essential (primary) hypertension: Secondary | ICD-10-CM | POA: Diagnosis not present

## 2024-10-11 DIAGNOSIS — E785 Hyperlipidemia, unspecified: Secondary | ICD-10-CM

## 2024-10-11 DIAGNOSIS — R7989 Other specified abnormal findings of blood chemistry: Secondary | ICD-10-CM | POA: Diagnosis not present

## 2024-10-11 MED ORDER — SILDENAFIL CITRATE 100 MG PO TABS
50.0000 mg | ORAL_TABLET | Freq: Every day | ORAL | 3 refills | Status: AC | PRN
  Filled 2024-10-11: qty 10, 10d supply, fill #0

## 2024-10-11 NOTE — Progress Notes (Signed)
 "  Subjective:    Patient ID: Kevin Wiggins, male    DOB: Sep 20, 1961, 64 y.o.   MRN: [id number]  HPI Former pt last seen about 4 years ago so new pt appt.  Kevin Wiggins is a 64 year old male with hemochromatosis and diabetes who presents for follow-up on his chronic conditions.  He has hemochromatosis, previously managed with phlebotomies, and last saw a hematologist about four years ago. He has not had recent hematology follow-up and is unsure where to obtain phlebotomy now, though his iron levels have been checked occasionally.  He had heart surgery at [facility] and saw cardiology in 2023. Cardiology mentioned possible cardiac hemochromatosis, but he was told no evidence was found and that he should be monitored. He has not had a cardiac MRI because of claustrophobia and body size. He has not seen a cardiologist recently and has no current cardiac symptoms.  He has diabetes with recent higher sugars over the holidays and a recent fasting value of 130 mg/dL. He recalls a high prior A1c. He currently takes metformin  500 mg twice daily, glipizide  5 mg twice daily, and Tresiba  51 units daily. He notes he was previously overmedicated and has since reduced his regimen. He is trying to improve control by eating dinner early and then fasting until morning.  He has low testosterone  and previously used testosterone  cypionate injections with good effect. Another formulation from a different doctor was less effective. He is interested in resuming testosterone  therapy and is comfortable with self-injection.  He is not taking Lipitor because of muscle pain that he associates with the medication. He currently takes losartan  100 mg daily for blood pressure, with a recent reading of 122/78 mmHg.  He mentions a family history of severe hidradenitis suppurativa in his stepdaughter.        Review of Systems  Constitutional:  Negative for chills, fatigue and fever.  HENT:  Negative for congestion and ear pain.    Respiratory:  Negative for cough, chest tightness and wheezing.   Cardiovascular:  Negative for chest pain and palpitations.  Gastrointestinal:  Negative for abdominal pain, blood in stool, diarrhea and rectal pain.  Genitourinary:  Negative for dysuria, frequency and urgency.  Musculoskeletal:  Negative for back pain, myalgias and neck stiffness.  Skin:  Negative for rash.  Neurological:  Negative for dizziness, seizures, weakness and light-headedness.  Hematological:  Negative for adenopathy.  Psychiatric/Behavioral:  Negative for behavioral problems and dysphoric mood. The patient is not nervous/anxious.     Past Medical History:  Diagnosis Date   CAD (coronary artery disease) cardiologist-  dr dalton mclean   a. 07/2013: mild nonobstructive CAD (30% RCA)  by cath 07/25/13 as part of eval for VSD/SOB.   CKD (chronic kidney disease), stage II    Diastolic CHF (HCC)    a. Acute diastolic CHF 07/2013 in the setting of perimembranous VSD. Eval underway given mod pulm HTN and RV dilation.   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 10/ 20/ 2014 via cardiac cath ruptured  w/ significant L to R shunt   s/p  repair w/ CPB 08-26-2013 @[facility]    Secondary erythrocytosis    due to testosterone  therapy   Secondary testicular hypogonadism    due to hemochromatosis   Type 2 diabetes mellitus, uncontrolled    followed by pcp--- last A1c 11.0 on  06-05-2017/  on 07-14-2017 pt stated does not check CBGs, asked pt about A1c being high he stated doctor told him to watch carbs and did not add medication     Social History   Socioeconomic History   Marital status: Married    Spouse name: Not on file   Number of children: Not on file   Years of education: Not on file   Highest education level: Not on file  Occupational History    Employer: [employer]    Comment: Maintenance   Tobacco Use   Smoking status: Never   Smokeless tobacco: Never  Substance and Sexual Activity   Alcohol use: No    Alcohol/week: 0.0 standard drinks of alcohol   Drug use: No   Sexual activity: Not on file  Other Topics Concern   Not on file  Social History Narrative   Not on file   Social Drivers of Health   Tobacco Use: Low Risk (10/11/2024)   Patient History    Smoking Tobacco Use: Never    Smokeless Tobacco Use: Never    Passive Exposure: Not on file  Financial Resource Strain: Not on file  Food Insecurity: Low Risk (08/23/2024)   Received from [facility]   Epic    Within the past 12 months, you worried that your food would run out before you got money to buy more: Never true    Within the past 12 months, the food you bought just didn't last and you didn't have money to get more. : Never true  Transportation Needs: No Transportation Needs (08/23/2024)   Received from [facility]    In the past 12 months, has lack of reliable transportation kept you from medical appointments, meetings, work or from getting things needed for daily living? : No  Physical Activity: Not on file  Stress: Not on file  Social Connections: Unknown (02/13/2022)   Received from [facility]   Social Network    Social Network: Not on file  Intimate Partner Violence: Unknown (01/06/2022)   Received from [facility]   HITS    Physically Hurt: Not on file    Insult or Talk Down To: Not on file    Threaten Physical Harm: Not on file     Scream or Curse: Not on file  Depression (PHQ2-9): Low Risk (10/11/2024)   Depression (PHQ2-9)    PHQ-2 Score: 2  Alcohol Screen: Not on file  Housing: Low Risk (08/23/2024)   Received from [facility]   Epic    What is your living situation today?: I have a steady place to live    Think about the place you live. Do you have problems with any of the following? Choose all that apply:: None/None on this list  Utilities: Low Risk (08/23/2024)   Received from [facility]   Utilities    In the past 12 months has the electric, gas, oil, or water company threatened to shut off services in your  home? : No  Health Literacy: Not on file    Past Surgical History:  Procedure Laterality Date   LEFT AND RIGHT HEART CATHETERIZATION WITH CORONARY ANGIOGRAM N/A 07/25/2013   Procedure: LEFT AND RIGHT HEART CATHETERIZATION WITH CORONARY ANGIOGRAM;  Surgeon: Toribio JONELLE Fuel, MD;  Location: [facility] CATH LAB;  Service: Cardiovascular;  Laterality: N/A; patent coronary arteries w/ non-obstructive CAD (30% RCA); moderate pulmonary HTN;  O2 saturations consistent w/ significant left-right intracardiac shunt   MASS EXCISION Right 07/17/2017   Procedure: REMOVAL OF RIGHT UPPER ARM SOFT TISSUE MASS;  Surgeon: Lily Boas, MD;  Location: [facility] [place];  Service: General;  Laterality: Right;   REPAIR SINUS VALSALVA ANEURYSM W/ CPB (STERNOTOMY)  08-26-2013   @[facility]    patched   TEE WITHOUT CARDIOVERSION N/A 07/25/2013   Procedure: TRANSESOPHAGEAL ECHOCARDIOGRAM (TEE);  Surgeon: Leim VEAR Moose, MD;  Location: [facility] ENDOSCOPY;  Service: Cardiovascular;  Laterality: N/A; moderate dilated RV w/ mild decreased function; a small restrictive perimembranous VSD, peak velocity 4.22m/sec, peak gradient 9mmHg/ trivial AI/  trivial TR/ mild LAE/ no PFO/  mild aortic atherosclerotic plaque   TRANSTHORACIC ECHOCARDIOGRAM  10-27-2013   dr rolan   mild LVH, ef 55-60%/  mild LAE/  sinuses of valsalva appear normal/   PASP could not be accurately estimated (per dr rolan stable s/p sinus of valsalva fistula repair)    Family History  Problem Relation Age of Onset   Cancer Mother    Hypertension Father    Congestive Heart Failure Father    Colon cancer Paternal Aunt    Hemochromatosis Other        family history    Allergies[1]  Medications Ordered Prior to Encounter[2]  BP 122/78 (BP Location: Left Arm, Patient Position: Sitting, Cuff Size: Large)   Pulse 89   Temp 97.6 F (36.4 C) (Oral)   Resp 14   Ht 5' 9 (1.753 m)   Wt 265 lb 12.8 oz (120.6 kg)   SpO2 98%   BMI 39.25 kg/m          Objective:   Physical Exam  General Mental Status- Alert. General Appearance- Not in acute distress.   Skin General: Color- Normal Color. Moisture- Normal Moisture.  Neck  No JVD.  Chest and Lung Exam Auscultation: Breath Sounds:-CTA  Cardiovascular Auscultation:Rythm- RRR Murmurs & Other Heart Sounds:Auscultation of the heart reveals- No Murmurs.  Abdomen Inspection:-Inspeection Normal. Palpation/Percussion:Note:No mass. Palpation and Percussion of the abdomen reveal- Non Tender, Non Distended + BS, no rebound or guarding.   Neurologic Cranial Nerve exam:- CN III-XII intact(No nystagmus), symmetric smile. Strength:- 5/5 equal and symmetric strength both upper and lower extremities.       Assessment & Plan:   Hemochromatosis No recent hematology follow-up. Cardiologist found no cardiac hemochromatosis. Possible future pacemaker need. - Referred to hematologist for evaluation and management. - Ordered iron panel. - Ordered CBC. - Ordered metabolic panel.  Type 2 diabetes mellitus with hyperglycemia A1c at 9.9%. Blood sugar improving with diet. Current medications: Tresiba , metformin , glipizide . Previous overmedication concern. Insurance issues with Mounjaro, Ozempic  possible. - Ordered A1c. - Continue Tresiba , metformin , and glipizide . - Will discuss potential referral to  endocrinologist for diabetes management.  Low testosterone  in male Low testosterone  with temporary relief from injections. Interested in resuming therapy. Discussed benefits and risks, including PSA monitoring and blood thickening. - Ordered testosterone  panel. - Ordered PSA. - Will discuss potential referral to endocrinologist for testosterone  management.  Essential hypertension Well-controlled with losartan . BP 122/78 mmHg. -  Continue losartan  100 mg daily.  Hyperlipidemia Discontinued Lipitor due to muscle pain. - Ordered lipid panel.  General Health Maintenance Due for colonoscopy, last in 2016 with 10-year repeat recommendation. - Ordered referral for colonoscopy.  Erectile Dysfunction -pt states tried Viagra , Levitra  and Cialis . -he states none effective now. In past Viagra  helped the most but now has not helped last time. - No discussion some time since last tried Viagra . Will prescribe today again at 100 mg dose. If does not work then refer to urologist.    Follow up date to be determined after lab review. Get schedule within a week.  Sarahy Creedon, PA-C    I personally spent a total of 49 minutes in the care of the patient today including performing a medically appropriate exam/evaluation, counseling and educating, placing orders, referring and communicating with other health care professionals, and documenting clinical information in the EHR.       [1]  Allergies Allergen Reactions   Amlodipine  Other (See Comments)    Pt reports dizziness and sleepy.  Pt reports dizziness and sleepy.    Lisinopril  Other (See Comments)    cough cough   Ozempic  (0.25 Or 0.5 Mg-Dose) [Semaglutide (0.25 Or 0.5mg -Dos)] Nausea Only  [2]  Current Outpatient Medications on File Prior to Visit  Medication Sig Dispense Refill   fluticasone  (FLONASE ) 50 MCG/ACT nasal spray as directed.     glipiZIDE  (GLUCOTROL ) 5 MG tablet TAKE 1 TABLET (5 MG TOTAL) BY MOUTH 2 (TWO) TIMES DAILY  BEFORE A MEAL. 180 tablet 3   glucose blood (ONETOUCH ULTRA) test strip Use to check blood sugar 2 times a day. 100 each 12   insulin  glargine (LANTUS ) 100 UNIT/ML Solostar Pen INJECT 50 UNITS INTO THE SKIN AT BEDTIME. 30 mL 1   losartan  (COZAAR ) 100 MG tablet TAKE 1 TABLET (100 MG TOTAL) BY MOUTH DAILY. 90 tablet 3   metFORMIN  (GLUCOPHAGE -XR) 500 MG 24 hr tablet TAKE 4 TABLETS (2,000 MG TOTAL) BY MOUTH DAILY WITH SUPPER. 360 tablet 3   naproxen sodium (ALEVE) 220 MG tablet Take 220 mg by mouth as needed.     omeprazole (PRILOSEC) 20 MG capsule Take 20 mg by mouth every evening.      sildenafil  (VIAGRA ) 100 MG tablet Take 0.5-1 tablets (50-100 mg total) by mouth daily as needed for erectile dysfunction. 5 tablet 3   vardenafil  (LEVITRA ) 20 MG tablet Take 1 tablet (20 mg total) by mouth daily as needed for erectile dysfunction. (Patient taking differently: Take 20 mg by mouth daily as needed for erectile dysfunction.) 10 tablet 0   atorvastatin  (LIPITOR) 20 MG tablet TAKE 1 TABLET (20 MG TOTAL) BY MOUTH DAILY. (Patient not taking: Reported on 10/11/2024) 90 tablet 1   HYDROcodone-acetaminophen  (NORCO/VICODIN) 5-325 MG tablet hydrocodone 5 mg-acetaminophen  325 mg tablet  TAKE 1 TABLET BY MOUTH EVERY 6 (SIX) HOURS AS NEEDED FOR UP TO 5 DAYS. (Patient not taking: Reported on 10/11/2024)     testosterone  cypionate (DEPOTESTOSTERONE CYPIONATE) 200 MG/ML injection Inject 1 mL (200 mg total) into the muscle every 14 (fourteen) days. As needed now (Patient not taking: Reported on 10/11/2024) 10 mL 3   traMADol  (ULTRAM ) 50 MG tablet Take 1-2 tablets (50-100 mg total) by mouth every 6 (six) hours as needed. (Patient not taking: Reported on 10/11/2024) 30 tablet 0   No current facility-administered medications on file prior to visit.   "

## 2024-10-11 NOTE — Patient Instructions (Addendum)
 Hemochromatosis No recent hematology follow-up. Cardiologist found no cardiac hemochromatosis. Possible future pacemaker need. - Referred to hematologist for evaluation and management. - Ordered iron panel. - Ordered CBC. - Ordered metabolic panel.  Type 2 diabetes mellitus with hyperglycemia A1c at 9.9%. Blood sugar improving with diet. Current medications: Tresiba , metformin , glipizide . Previous overmedication concern. Insurance issues with Mounjaro, Ozempic  possible. - Ordered A1c. - Continue Tresiba , metformin , and glipizide . - Will discuss potential referral to endocrinologist for diabetes management.  Low testosterone  in male Low testosterone  with temporary relief from injections. Interested in resuming therapy. Discussed benefits and risks, including PSA monitoring and blood thickening. - Ordered testosterone  panel. - Ordered PSA. - Will discuss potential referral to endocrinologist for testosterone  management.  Essential hypertension Well-controlled with losartan . BP 122/78 mmHg. - Continue losartan  100 mg daily.  Hyperlipidemia Discontinued Lipitor due to muscle pain. - Ordered lipid panel.  General Health Maintenance Due for colonoscopy, last in 2016 with 10-year repeat recommendation. - Ordered referral for colonoscopy.  Erectile Dysfunction -pt states tried Viagra , Levitra  and Cialis . -he states none effective now. In past Viagra  helped the most but now has not helped last time. - No discussion some time since last tried Viagra . Will prescribe today again at 100 mg dose. If does not work then refer to urologist.    Follow up date to be determined after lab review. Get schedule within a week.

## 2024-10-14 ENCOUNTER — Other Ambulatory Visit (HOSPITAL_BASED_OUTPATIENT_CLINIC_OR_DEPARTMENT_OTHER): Payer: Self-pay

## 2024-10-14 ENCOUNTER — Other Ambulatory Visit

## 2024-10-14 DIAGNOSIS — Z125 Encounter for screening for malignant neoplasm of prostate: Secondary | ICD-10-CM

## 2024-10-14 DIAGNOSIS — R7989 Other specified abnormal findings of blood chemistry: Secondary | ICD-10-CM

## 2024-10-14 DIAGNOSIS — I10 Essential (primary) hypertension: Secondary | ICD-10-CM

## 2024-10-14 DIAGNOSIS — Z794 Long term (current) use of insulin: Secondary | ICD-10-CM | POA: Diagnosis not present

## 2024-10-14 DIAGNOSIS — E785 Hyperlipidemia, unspecified: Secondary | ICD-10-CM | POA: Diagnosis not present

## 2024-10-14 DIAGNOSIS — E1165 Type 2 diabetes mellitus with hyperglycemia: Secondary | ICD-10-CM

## 2024-10-14 LAB — COMPREHENSIVE METABOLIC PANEL WITH GFR
ALT: 49 U/L (ref 3–53)
AST: 23 U/L (ref 5–37)
Albumin: 4.2 g/dL (ref 3.5–5.2)
Alkaline Phosphatase: 58 U/L (ref 39–117)
BUN: 19 mg/dL (ref 6–23)
CO2: 27 meq/L (ref 19–32)
Calcium: 9.6 mg/dL (ref 8.4–10.5)
Chloride: 102 meq/L (ref 96–112)
Creatinine, Ser: 1.39 mg/dL (ref 0.40–1.50)
GFR: 53.72 mL/min — ABNORMAL LOW
Glucose, Bld: 182 mg/dL — ABNORMAL HIGH (ref 70–99)
Potassium: 4.3 meq/L (ref 3.5–5.1)
Sodium: 138 meq/L (ref 135–145)
Total Bilirubin: 0.7 mg/dL (ref 0.2–1.2)
Total Protein: 7 g/dL (ref 6.0–8.3)

## 2024-10-14 LAB — IRON,TIBC AND FERRITIN PANEL
%SAT: 42 % (ref 20–48)
Ferritin: 273 ng/mL (ref 24–380)
Iron: 118 ug/dL (ref 50–180)
TIBC: 284 ug/dL (ref 250–425)

## 2024-10-14 LAB — CBC WITH DIFFERENTIAL/PLATELET
Basophils Absolute: 0.1 K/uL (ref 0.0–0.1)
Basophils Relative: 1 % (ref 0.0–3.0)
Eosinophils Absolute: 0.4 K/uL (ref 0.0–0.7)
Eosinophils Relative: 5.3 % — ABNORMAL HIGH (ref 0.0–5.0)
HCT: 48.4 % (ref 39.0–52.0)
Hemoglobin: 16.5 g/dL (ref 13.0–17.0)
Lymphocytes Relative: 20.3 % (ref 12.0–46.0)
Lymphs Abs: 1.7 K/uL (ref 0.7–4.0)
MCHC: 34.2 g/dL (ref 30.0–36.0)
MCV: 94.3 fl (ref 78.0–100.0)
Monocytes Absolute: 0.6 K/uL (ref 0.1–1.0)
Monocytes Relative: 7.5 % (ref 3.0–12.0)
Neutro Abs: 5.5 K/uL (ref 1.4–7.7)
Neutrophils Relative %: 65.9 % (ref 43.0–77.0)
Platelets: 199 K/uL (ref 150.0–400.0)
RBC: 5.13 Mil/uL (ref 4.22–5.81)
RDW: 12.9 % (ref 11.5–15.5)
WBC: 8.4 K/uL (ref 4.0–10.5)

## 2024-10-14 LAB — TESTOSTERONE TOTAL,FREE,BIO, MALES
Albumin: 4.2 g/dL (ref 3.6–5.1)
Sex Hormone Binding: 31 nmol/L (ref 22–77)
Testosterone, Bioavailable: 86.1 ng/dL — ABNORMAL LOW (ref 110.0–575.0)
Testosterone, Free: 44.7 pg/mL — ABNORMAL LOW (ref 46.0–224.0)
Testosterone: 326 ng/dL (ref 250–827)

## 2024-10-14 LAB — LIPID PANEL
Cholesterol: 247 mg/dL — ABNORMAL HIGH (ref 28–200)
HDL: 43.7 mg/dL
LDL Cholesterol: 147 mg/dL — ABNORMAL HIGH (ref 10–99)
NonHDL: 203.28
Total CHOL/HDL Ratio: 6
Triglycerides: 282 mg/dL — ABNORMAL HIGH (ref 10.0–149.0)
VLDL: 56.4 mg/dL — ABNORMAL HIGH (ref 0.0–40.0)

## 2024-10-14 LAB — PSA: PSA: 0.38 ng/mL (ref 0.10–4.00)

## 2024-10-14 LAB — HEMOGLOBIN A1C: Hgb A1c MFr Bld: 9.6 % — ABNORMAL HIGH (ref 4.6–6.5)

## 2024-10-15 ENCOUNTER — Ambulatory Visit: Payer: Self-pay | Admitting: Medical

## 2024-10-17 NOTE — Progress Notes (Signed)
 Pt called and aware says he does see that specific endo at [facility] and he will fu with them

## 2024-10-31 ENCOUNTER — Inpatient Hospital Stay: Admitting: Family

## 2024-10-31 ENCOUNTER — Inpatient Hospital Stay

## 2024-11-07 ENCOUNTER — Inpatient Hospital Stay

## 2024-11-07 ENCOUNTER — Inpatient Hospital Stay: Admitting: Family

## 2024-11-09 ENCOUNTER — Inpatient Hospital Stay: Attending: Hematology & Oncology

## 2024-11-09 ENCOUNTER — Inpatient Hospital Stay: Admitting: Family

## 2024-11-09 ENCOUNTER — Telehealth: Payer: Self-pay | Admitting: Medical

## 2024-11-09 ENCOUNTER — Inpatient Hospital Stay

## 2024-11-09 ENCOUNTER — Other Ambulatory Visit: Payer: Self-pay | Admitting: Family

## 2024-11-09 ENCOUNTER — Encounter: Payer: Self-pay | Admitting: Family

## 2024-11-09 ENCOUNTER — Other Ambulatory Visit (HOSPITAL_BASED_OUTPATIENT_CLINIC_OR_DEPARTMENT_OTHER): Payer: Self-pay

## 2024-11-09 DIAGNOSIS — Z79899 Other long term (current) drug therapy: Secondary | ICD-10-CM | POA: Insufficient documentation

## 2024-11-09 DIAGNOSIS — R7989 Other specified abnormal findings of blood chemistry: Secondary | ICD-10-CM

## 2024-11-09 DIAGNOSIS — D751 Secondary polycythemia: Secondary | ICD-10-CM | POA: Insufficient documentation

## 2024-11-09 DIAGNOSIS — E119 Type 2 diabetes mellitus without complications: Secondary | ICD-10-CM | POA: Insufficient documentation

## 2024-11-09 DIAGNOSIS — Z7984 Long term (current) use of oral hypoglycemic drugs: Secondary | ICD-10-CM | POA: Insufficient documentation

## 2024-11-09 LAB — CBC WITH DIFFERENTIAL (CANCER CENTER ONLY)
Abs Immature Granulocytes: 0.04 10*3/uL (ref 0.00–0.07)
Basophils Absolute: 0.1 10*3/uL (ref 0.0–0.1)
Basophils Relative: 1 %
Eosinophils Absolute: 0.3 10*3/uL (ref 0.0–0.5)
Eosinophils Relative: 4 %
HCT: 49 % (ref 39.0–52.0)
Hemoglobin: 17.3 g/dL — ABNORMAL HIGH (ref 13.0–17.0)
Immature Granulocytes: 1 %
Lymphocytes Relative: 19 %
Lymphs Abs: 1.4 10*3/uL (ref 0.7–4.0)
MCH: 32.2 pg (ref 26.0–34.0)
MCHC: 35.3 g/dL (ref 30.0–36.0)
MCV: 91.2 fL (ref 80.0–100.0)
Monocytes Absolute: 0.7 10*3/uL (ref 0.1–1.0)
Monocytes Relative: 9 %
Neutro Abs: 5.1 10*3/uL (ref 1.7–7.7)
Neutrophils Relative %: 66 %
Platelet Count: 213 10*3/uL (ref 150–400)
RBC: 5.37 MIL/uL (ref 4.22–5.81)
RDW: 12.6 % (ref 11.5–15.5)
WBC Count: 7.7 10*3/uL (ref 4.0–10.5)
nRBC: 0 % (ref 0.0–0.2)

## 2024-11-09 LAB — CMP (CANCER CENTER ONLY)
ALT: 75 U/L — ABNORMAL HIGH (ref 0–44)
AST: 37 U/L (ref 15–41)
Albumin: 4.5 g/dL (ref 3.5–5.0)
Alkaline Phosphatase: 62 U/L (ref 38–126)
Anion gap: 14 (ref 5–15)
BUN: 18 mg/dL (ref 8–23)
CO2: 25 mmol/L (ref 22–32)
Calcium: 9.6 mg/dL (ref 8.9–10.3)
Chloride: 99 mmol/L (ref 98–111)
Creatinine: 1.31 mg/dL — ABNORMAL HIGH (ref 0.61–1.24)
GFR, Estimated: >60 mL/min
Glucose, Bld: 249 mg/dL — ABNORMAL HIGH (ref 70–99)
Potassium: 4.8 mmol/L (ref 3.5–5.1)
Sodium: 139 mmol/L (ref 135–145)
Total Bilirubin: 0.9 mg/dL (ref 0.0–1.2)
Total Protein: 7.5 g/dL (ref 6.5–8.1)

## 2024-11-09 LAB — IRON AND IRON BINDING CAPACITY (CC-WL,HP ONLY)
Iron: 164 ug/dL (ref 45–182)
Saturation Ratios: 49 % — ABNORMAL HIGH (ref 17.9–39.5)
TIBC: 335 ug/dL (ref 250–450)
UIBC: 171 ug/dL

## 2024-11-09 LAB — FERRITIN: Ferritin: 591 ng/mL — ABNORMAL HIGH (ref 24–336)

## 2024-11-09 NOTE — Progress Notes (Signed)
 1 unit therapeutic phlebotomy performed over 23 minutes using a 18 g catheter via right AC. Patient tolerated well. Nourishment provided.

## 2024-11-09 NOTE — Patient Instructions (Signed)
 Planned Blood Draining (Therapeutic Bleeding): What to Expect The planned draining of blood from the body is done to treat a health problem. It's also called therapeutic bleeding, or therapeutic phlebotomy. It's a lot like donating blood. The average adult has 9-12 pints (4.3-5.7 L) of blood in their body. In therapeutic bleeding, usually a pint (470 mL) of blood is removed at a time. Therapeutic bleeding may be used to treat: Hemochromatosis. This is when the blood has too much iron in it. Polycythemia vera. This happens when the blood has too many red blood cells. Porphyria cutanea tarda. This is when the hemoglobin for red blood cells isn't made right. Sickle cell disease. This happens when the red blood cells aren't normal in shape and don't carry oxygen well. Tell a health care provider about: Any allergies you have. All medicines you take. These include vitamins, herbs, eye drops, and creams. Any bleeding problems you have. Any surgeries you've had. Any medical problems you have. Whether you're pregnant or may be pregnant. What are the risks? Your health care provider will talk with you about risks. These may include: Low blood pressure. Soreness, bleeding, swelling, or bruising where the IV was placed. Infection. What happens before the procedure? Ask about changing or stopping: Any medicines you take. Any vitamins, herbs, or supplements you take. Do not take aspirin or ibuprofen  unless you're told to Wear clothing with sleeves that can be raised above the elbow. You may have a small amount of blood (blood sample) taken. Your blood pressure, pulse rate, and breathing rate will be checked. What happens during the procedure?  An IV will be put into a vein in your hand or arm. You may be given a medicine to numb the area first. Tubing and a collection bag will be attached to the needle. Blood will flow through the needle and tubing into the collection bag. The collection bag  will be placed lower than your arm. This helps the blood to flow into the bag. You may be asked to open and close your hand slowly again and again. After the amount of blood needed has been removed from your body, the collection bag and tubing will be clamped. The needle will be taken out of your vein. Pressure will be held where the needle was put in, also called the IV site. This is done to stop the bleeding. A bandage will be placed over the IV site. These steps may vary. Ask what you can expect. What happens after the procedure? Your blood pressure, pulse rate, and breathing rate will be checked again. You'll be told to: Drink fluids. Eat a snack. This information is not intended to replace advice given to you by your health care provider. Make sure you discuss any questions you have with your health care provider. Document Revised: 02/05/2024 Document Reviewed: 02/05/2024 Elsevier Patient Education  2025 [employer].

## 2024-11-09 NOTE — Progress Notes (Signed)
 " Hematology and Oncology Follow Up Visit  Kevin Wiggins [id number] [birth year] 64 y.o. 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 He is doing well overall. He had a change in insurance was able to come back to our office.  Hgb is 17.3, Hct 49%, platelets 213 and WBC count 7.7.  Last month his ferritin was 273 and iron saturation was 42%.  Testosterone  injections have been on hold for a while per patient. He is hoping to restart at some point as they help him feel better.  He states that he has not had a phlebotomy since 2020.  No smoking, ETOH or recreational drug use.  Patient has past history of fatty liver. Last CT imaging was 1 year ago with [facility].  He has history of type II diabetes and currently takes Tresiba , Glipizide  and Metformin . Hgb A1c last month was 9.6.  No history of thyroid  disease.  No fever, chills, n/v, cough, rash, dizziness, SOB, chest pain, palpitations, avbdominal pain or changes in bowel or bladder habits.  He has chronic arthritic pain and swelling in the left knee and ankle secondary to an old injury.  No falls or syncope.  Appetite and hydration are good. Weight is stable at 261 lbs.   ECOG Performance Status: 1 - Symptomatic but completely ambulatory  Medications:  Allergies as of 11/09/2024       Reactions   Amlodipine  Other (See Comments)   Pt reports dizziness and sleepy.  Pt reports dizziness and sleepy.    Lisinopril  Other (See Comments)   cough cough   Ozempic  (0.25 Or 0.5 Mg-dose) [semaglutide (0.25 Or 0.5mg -dos)]  Nausea Only        Medication List        Accurate as of November 09, 2024 11:19 AM. If you have any questions, ask your nurse or doctor.          STOP taking these medications    atorvastatin  20 MG tablet Commonly known as: LIPITOR Stopped by: Lauraine Pepper, NP   HYDROcodone-acetaminophen  5-325 MG tablet Commonly known as: NORCO/VICODIN Stopped by: Lauraine Pepper, NP   traMADol  50 MG tablet Commonly known as: ULTRAM  Stopped by: Lauraine Pepper, NP   vardenafil  20 MG tablet Commonly known as: Levitra  Stopped by: Lauraine Pepper, NP       TAKE these medications    fluticasone  50 MCG/ACT nasal spray Commonly known as: FLONASE  as directed. What changed:  how much to take when to take this reasons to take this   glipiZIDE  5 MG tablet Commonly known as: GLUCOTROL  TAKE 1 TABLET (5 MG TOTAL) BY MOUTH 2 (TWO) TIMES DAILY BEFORE A MEAL.   losartan  100 MG tablet Commonly known as: COZAAR  TAKE 1 TABLET (100 MG TOTAL) BY MOUTH DAILY.   metFORMIN  500 MG 24 hr tablet Commonly known as: GLUCOPHAGE -XR TAKE 4 TABLETS (2,000 MG TOTAL) BY MOUTH DAILY WITH SUPPER.   naproxen sodium 220 MG tablet Commonly known as: ALEVE Take 220 mg by mouth as needed.   omeprazole 20 MG capsule Commonly known as: PRILOSEC Take 20 mg by mouth every  evening.   OneTouch Ultra test strip Generic drug: glucose blood Use to check blood sugar 2 times a day.   sildenafil  100 MG tablet Commonly known as: Viagra  Take 0.5-1 tablets (50-100 mg total) by mouth daily as needed for erectile dysfunction.   testosterone  cypionate 200 MG/ML injection Commonly known as: DEPOTESTOSTERONE CYPIONATE Inject 1 mL (200 mg total) into the muscle every 14 (fourteen) days. As needed now   Tresiba  100 UNIT/ML Soln Generic drug: Insulin  Degludec Inject 51 Units into the skin.   valsartan 160 MG tablet Commonly known as: DIOVAN Take 160 mg by mouth daily.        Allergies: Allergies[1]  Past Medical  History, Surgical history, Social history, and Family History were reviewed and updated.  Review of Systems: All other 10 point review of systems is negative.   Physical Exam:  height is 5' 9 (1.753 m) and weight is 261 lb 1.3 oz (118.4 kg). His oral temperature is 98.4 F (36.9 C). His blood pressure is 120/80 and his pulse is 94. His respiration is 18 and oxygen saturation is 95%.   Wt Readings from Last 3 Encounters:  11/09/24 261 lb 1.3 oz (118.4 kg)  10/11/24 265 lb 12.8 oz (120.6 kg)  07/18/20 274 lb (124.3 kg)    Ocular: Sclerae unicteric, pupils equal, round and reactive to light Ear-nose-throat: Oropharynx clear, dentition fair Lymphatic: No cervical or supraclavicular adenopathy Lungs no rales or rhonchi, good excursion bilaterally Heart regular rate and rhythm, no murmur appreciated Abd soft, nontender, positive bowel sounds, no liver or spleen tip palpated on exam, no fluid wave MSK no focal spinal tenderness, no joint edema Neuro: non-focal, well-oriented, appropriate affect Breasts: Deferred   Lab Results  Component Value Date   WBC 7.7 11/09/2024   HGB 17.3 (H) 11/09/2024   HCT 49.0 11/09/2024   MCV 91.2 11/09/2024   PLT 213 11/09/2024   Lab Results  Component Value Date   FERRITIN 273 10/14/2024   IRON 118 10/14/2024   TIBC 284 10/14/2024   UIBC 190 11/01/2018   IRONPCTSAT 42 10/14/2024   Lab Results  Component Value Date   RETICCTPCT 1.8 06/21/2015   RBC 5.37 11/09/2024   RETICCTABS 97.2 06/21/2015   No results found for: KPAFRELGTCHN, LAMBDASER, KAPLAMBRATIO No results found for: KIMBERLY LE, IGMSERUM No results found for: STEPHANY CARLOTA BENSON MARKEL EARLA JOANNIE DOC VICK, SPEI   Chemistry      Component Value Date/Time   NA 138 10/14/2024 0755   NA 141 09/07/2017 1503   NA 138 05/20/2017 1050   K 4.3 10/14/2024 0755   K 4.1 09/07/2017 1503   K 4.6 05/20/2017 1050   CL 102 10/14/2024 0755    CL 100 09/07/2017 1503   CO2 27 10/14/2024 0755   CO2 29 09/07/2017 1503   CO2 26 05/20/2017 1050   BUN 19 10/14/2024 0755   BUN 12 09/07/2017 1503   BUN 16.7 05/20/2017 1050   CREATININE 1.39 10/14/2024 0755   CREATININE 1.10 07/18/2020 1037   CREATININE 1.3 05/20/2017 1050      Component Value Date/Time   CALCIUM  9.6 10/14/2024 0755   CALCIUM  9.2 09/07/2017 1503   CALCIUM  10.0 05/20/2017 1050   ALKPHOS 58 10/14/2024 0755   ALKPHOS 57 09/07/2017 1503   ALKPHOS 60 05/20/2017 1050   AST 23 10/14/2024 0755   AST 22 11/01/2018 0914   AST 42 (H) 05/20/2017 1050   ALT 49 10/14/2024 0755   ALT 49 (H) 11/01/2018 0914  ALT 57 (H) 09/07/2017 1503   ALT 69 (H) 05/20/2017 1050   BILITOT 0.7 10/14/2024 0755   BILITOT 0.9 11/01/2018 0914   BILITOT 0.95 05/20/2017 1050       Impression and Plan: Mr. Hanrahan is a very pleasant 64 yo caucasian gentleman with history of Hemochromatosis as well as secondary erythrocytosis.  We will proceed with phlebotomy today for erythrocytosis.  Follow-up in 6 weeks to re-assess.   Lauraine Pepper, NP 2/4/202611:19 AM     [1]  Allergies Allergen Reactions   Amlodipine  Other (See Comments)    Pt reports dizziness and sleepy.  Pt reports dizziness and sleepy.    Lisinopril  Other (See Comments)    cough cough   Ozempic  (0.25 Or 0.5 Mg-Dose) [Semaglutide (0.25 Or 0.5mg -Dos)] Nausea Only   "

## 2024-11-10 ENCOUNTER — Encounter: Payer: Self-pay | Admitting: Family

## 2024-11-10 ENCOUNTER — Other Ambulatory Visit (HOSPITAL_BASED_OUTPATIENT_CLINIC_OR_DEPARTMENT_OTHER): Payer: Self-pay

## 2024-11-10 ENCOUNTER — Telehealth: Payer: Self-pay | Admitting: Dietician

## 2024-11-10 LAB — OPHTHALMOLOGY REPORT-SCANNED

## 2024-11-10 MED ORDER — LOSARTAN POTASSIUM 100 MG PO TABS
ORAL_TABLET | Freq: Every day | ORAL | 3 refills | Status: AC
  Filled 2024-11-10: qty 90, 90d supply, fill #0

## 2024-11-10 NOTE — Telephone Encounter (Signed)
 Pt is scheduled

## 2024-11-10 NOTE — Telephone Encounter (Signed)
 Rock- can you try calling Pt to schedule f/u w/ [place] in 3 months please?

## 2024-11-10 NOTE — Telephone Encounter (Signed)
 Follow up in 3 months. Losartan  sent in. Make sure he is not taking valsartan as in same class and it looks like historical med. Will you take valsartan off med list. Let me know if he states taking valsartan.

## 2024-11-10 NOTE — Telephone Encounter (Signed)
 Patient has called and stated that his insurance has changed and that his Tresiba  is no longer covered.  He does not know what is formulary.  His new insurance is [facility]- medicaid.  Will message MD.  Leita Constable, RD, LDN, CDCES, DipACLM

## 2024-12-23 ENCOUNTER — Inpatient Hospital Stay

## 2024-12-23 ENCOUNTER — Inpatient Hospital Stay: Admitting: Family

## 2025-01-17 ENCOUNTER — Ambulatory Visit: Payer: PRIVATE HEALTH INSURANCE | Admitting: Medical

## 2025-02-07 ENCOUNTER — Ambulatory Visit: Admitting: Medical
